# Patient Record
Sex: Male | Born: 1965 | Race: White | Hispanic: No | Marital: Single | Smoking: Current every day smoker
Health system: Southern US, Community
[De-identification: ages and names within clinical notes are randomized; demographics above are authoritative.]

## PROBLEM LIST (undated history)

## (undated) ENCOUNTER — Inpatient Hospital Stay: Payer: Self-pay

## (undated) DIAGNOSIS — I10 Essential (primary) hypertension: Secondary | ICD-10-CM

## (undated) DIAGNOSIS — F32A Depression, unspecified: Secondary | ICD-10-CM

## (undated) DIAGNOSIS — J449 Chronic obstructive pulmonary disease, unspecified: Secondary | ICD-10-CM

## (undated) DIAGNOSIS — I639 Cerebral infarction, unspecified: Secondary | ICD-10-CM

## (undated) DIAGNOSIS — F329 Major depressive disorder, single episode, unspecified: Secondary | ICD-10-CM

## (undated) DIAGNOSIS — M1990 Unspecified osteoarthritis, unspecified site: Secondary | ICD-10-CM

## (undated) DIAGNOSIS — E785 Hyperlipidemia, unspecified: Secondary | ICD-10-CM

## (undated) DIAGNOSIS — K5792 Diverticulitis of intestine, part unspecified, without perforation or abscess without bleeding: Secondary | ICD-10-CM

## (undated) HISTORY — DX: Major depressive disorder, single episode, unspecified: F32.9

## (undated) HISTORY — DX: Depression, unspecified: F32.A

## (undated) HISTORY — DX: Cerebral infarction, unspecified: I63.9

## (undated) HISTORY — DX: Hyperlipidemia, unspecified: E78.5

## (undated) HISTORY — DX: Chronic obstructive pulmonary disease, unspecified: J44.9

## (undated) HISTORY — DX: Unspecified osteoarthritis, unspecified site: M19.90

## (undated) HISTORY — DX: Diverticulitis of intestine, part unspecified, without perforation or abscess without bleeding: K57.92

## (undated) HISTORY — DX: Essential (primary) hypertension: I10

## (undated) HISTORY — PX: JOINT REPLACEMENT: SHX530

## (undated) SURGERY — Surgical Case
Anesthesia: *Unknown

---

## 2004-08-29 ENCOUNTER — Emergency Department: Payer: Self-pay | Admitting: Emergency Medicine

## 2007-06-28 ENCOUNTER — Emergency Department: Payer: Self-pay | Admitting: Emergency Medicine

## 2010-02-18 HISTORY — PX: OTHER SURGICAL HISTORY: SHX169

## 2011-04-04 ENCOUNTER — Emergency Department: Payer: Self-pay | Admitting: Emergency Medicine

## 2011-04-04 LAB — BASIC METABOLIC PANEL
Anion Gap: 6 — ABNORMAL LOW (ref 7–16)
Calcium, Total: 8.7 mg/dL (ref 8.5–10.1)
Chloride: 106 mmol/L (ref 98–107)
Co2: 28 mmol/L (ref 21–32)
Osmolality: 279 (ref 275–301)
Potassium: 4.1 mmol/L (ref 3.5–5.1)

## 2011-04-04 LAB — CBC
MCHC: 34.1 g/dL (ref 32.0–36.0)
MCV: 99 fL (ref 80–100)
RBC: 4.28 10*6/uL — ABNORMAL LOW (ref 4.40–5.90)
RDW: 13.4 % (ref 11.5–14.5)
WBC: 7.1 10*3/uL (ref 3.8–10.6)

## 2011-07-30 DIAGNOSIS — J441 Chronic obstructive pulmonary disease with (acute) exacerbation: Secondary | ICD-10-CM | POA: Insufficient documentation

## 2011-07-30 DIAGNOSIS — J449 Chronic obstructive pulmonary disease, unspecified: Secondary | ICD-10-CM | POA: Insufficient documentation

## 2011-08-15 DIAGNOSIS — E669 Obesity, unspecified: Secondary | ICD-10-CM | POA: Insufficient documentation

## 2012-08-13 DIAGNOSIS — M778 Other enthesopathies, not elsewhere classified: Secondary | ICD-10-CM | POA: Insufficient documentation

## 2013-09-28 ENCOUNTER — Emergency Department: Payer: Self-pay | Admitting: Internal Medicine

## 2014-02-18 DIAGNOSIS — I639 Cerebral infarction, unspecified: Secondary | ICD-10-CM

## 2014-02-18 HISTORY — DX: Cerebral infarction, unspecified: I63.9

## 2015-10-03 DIAGNOSIS — M25511 Pain in right shoulder: Secondary | ICD-10-CM

## 2015-10-03 DIAGNOSIS — M25561 Pain in right knee: Secondary | ICD-10-CM

## 2015-10-03 DIAGNOSIS — M25562 Pain in left knee: Secondary | ICD-10-CM

## 2015-10-03 DIAGNOSIS — Z96611 Presence of right artificial shoulder joint: Secondary | ICD-10-CM | POA: Insufficient documentation

## 2015-10-03 DIAGNOSIS — Z96612 Presence of left artificial shoulder joint: Secondary | ICD-10-CM | POA: Insufficient documentation

## 2015-10-03 DIAGNOSIS — M25512 Pain in left shoulder: Secondary | ICD-10-CM

## 2015-10-03 DIAGNOSIS — G8929 Other chronic pain: Secondary | ICD-10-CM | POA: Insufficient documentation

## 2016-10-10 ENCOUNTER — Ambulatory Visit: Payer: Medicaid Other | Admitting: Student in an Organized Health Care Education/Training Program

## 2016-10-17 ENCOUNTER — Ambulatory Visit
Payer: Medicaid Other | Attending: Student in an Organized Health Care Education/Training Program | Admitting: Student in an Organized Health Care Education/Training Program

## 2016-10-17 ENCOUNTER — Encounter: Payer: Self-pay | Admitting: Student in an Organized Health Care Education/Training Program

## 2016-10-17 VITALS — BP 162/96 | HR 102 | Temp 98.9°F | Resp 16 | Ht 73.5 in | Wt 270.0 lb

## 2016-10-17 DIAGNOSIS — Z96612 Presence of left artificial shoulder joint: Secondary | ICD-10-CM | POA: Diagnosis not present

## 2016-10-17 DIAGNOSIS — M25512 Pain in left shoulder: Secondary | ICD-10-CM | POA: Insufficient documentation

## 2016-10-17 DIAGNOSIS — Z96653 Presence of artificial knee joint, bilateral: Secondary | ICD-10-CM | POA: Diagnosis not present

## 2016-10-17 DIAGNOSIS — Z7982 Long term (current) use of aspirin: Secondary | ICD-10-CM | POA: Insufficient documentation

## 2016-10-17 DIAGNOSIS — Z96611 Presence of right artificial shoulder joint: Secondary | ICD-10-CM

## 2016-10-17 DIAGNOSIS — M19011 Primary osteoarthritis, right shoulder: Secondary | ICD-10-CM | POA: Diagnosis not present

## 2016-10-17 DIAGNOSIS — E785 Hyperlipidemia, unspecified: Secondary | ICD-10-CM | POA: Diagnosis not present

## 2016-10-17 DIAGNOSIS — Z87891 Personal history of nicotine dependence: Secondary | ICD-10-CM | POA: Insufficient documentation

## 2016-10-17 DIAGNOSIS — M19012 Primary osteoarthritis, left shoulder: Secondary | ICD-10-CM

## 2016-10-17 DIAGNOSIS — F329 Major depressive disorder, single episode, unspecified: Secondary | ICD-10-CM | POA: Diagnosis not present

## 2016-10-17 DIAGNOSIS — Z6835 Body mass index (BMI) 35.0-35.9, adult: Secondary | ICD-10-CM | POA: Diagnosis not present

## 2016-10-17 DIAGNOSIS — G894 Chronic pain syndrome: Secondary | ICD-10-CM | POA: Diagnosis not present

## 2016-10-17 DIAGNOSIS — M5136 Other intervertebral disc degeneration, lumbar region: Secondary | ICD-10-CM | POA: Diagnosis not present

## 2016-10-17 DIAGNOSIS — M25561 Pain in right knee: Secondary | ICD-10-CM | POA: Diagnosis not present

## 2016-10-17 DIAGNOSIS — Z79899 Other long term (current) drug therapy: Secondary | ICD-10-CM | POA: Diagnosis not present

## 2016-10-17 DIAGNOSIS — M51369 Other intervertebral disc degeneration, lumbar region without mention of lumbar back pain or lower extremity pain: Secondary | ICD-10-CM

## 2016-10-17 DIAGNOSIS — G8929 Other chronic pain: Secondary | ICD-10-CM

## 2016-10-17 DIAGNOSIS — J449 Chronic obstructive pulmonary disease, unspecified: Secondary | ICD-10-CM | POA: Insufficient documentation

## 2016-10-17 DIAGNOSIS — M25552 Pain in left hip: Secondary | ICD-10-CM | POA: Diagnosis not present

## 2016-10-17 DIAGNOSIS — M47816 Spondylosis without myelopathy or radiculopathy, lumbar region: Secondary | ICD-10-CM | POA: Diagnosis not present

## 2016-10-17 DIAGNOSIS — M25572 Pain in left ankle and joints of left foot: Secondary | ICD-10-CM | POA: Insufficient documentation

## 2016-10-17 DIAGNOSIS — M25571 Pain in right ankle and joints of right foot: Secondary | ICD-10-CM | POA: Insufficient documentation

## 2016-10-17 DIAGNOSIS — M25511 Pain in right shoulder: Secondary | ICD-10-CM | POA: Diagnosis present

## 2016-10-17 DIAGNOSIS — M25562 Pain in left knee: Secondary | ICD-10-CM | POA: Diagnosis not present

## 2016-10-17 DIAGNOSIS — K219 Gastro-esophageal reflux disease without esophagitis: Secondary | ICD-10-CM | POA: Diagnosis not present

## 2016-10-17 DIAGNOSIS — Z8673 Personal history of transient ischemic attack (TIA), and cerebral infarction without residual deficits: Secondary | ICD-10-CM | POA: Diagnosis not present

## 2016-10-17 DIAGNOSIS — I10 Essential (primary) hypertension: Secondary | ICD-10-CM | POA: Insufficient documentation

## 2016-10-17 DIAGNOSIS — M170 Bilateral primary osteoarthritis of knee: Secondary | ICD-10-CM

## 2016-10-17 MED ORDER — TIZANIDINE HCL 4 MG PO TABS: 4.0000 mg | ORAL_TABLET | Freq: Three times a day (TID) | ORAL | 2 refills | Status: AC | PRN

## 2016-10-17 MED ORDER — MELOXICAM 7.5 MG PO TABS: 7.5000 mg | ORAL_TABLET | Freq: Two times a day (BID) | ORAL | 0 refills | Status: AC | PRN

## 2016-10-17 MED ORDER — TIZANIDINE HCL 4 MG PO TABS: 4.0000 mg | ORAL_TABLET | Freq: Three times a day (TID) | ORAL | 0 refills | Status: DC | PRN

## 2016-10-17 MED ORDER — GABAPENTIN 300 MG PO CAPS: 600.0000 mg | ORAL_CAPSULE | Freq: Three times a day (TID) | ORAL | 2 refills | Status: DC

## 2016-10-17 NOTE — Patient Instructions (Addendum)
Today we discussed the following:  #1. We will get imaging studies, x-rays of your knees, hips, lumbar spine, shoulders, right ankle. We will review this at the next visit.  #2. Increase his gabapentin to 600 mg 3 times daily. Prescription has been provided.  #3. Start tizanidine 4 mg 3 times a day when necessary for muscle spasms.  #4. Prescription for meloxicam/Mobic 7.5 mg twice daily for 14 days.  #5. Urine drug screen today.  #6. Follow-up in one month.

## 2016-10-17 NOTE — Progress Notes (Signed)
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 opioid therapy pending pain psych evaluation and appropriate urine drug screens.   Patient has not had any recent imaging studies in the last 3 years so I think it is worthwhile to get x-rays of his shoulder, low back, hips, SI joints, knees, left ankle.  In regards to medication management, patient is on gabapentin 300 mg 3 times a  day which she has been on for the last year. We will increase his dose to 600 mg 3 times daily. I'll also prescribe the patient tizanidine 4 mg twice a day to 3 times a day when necessary muscle spasms. Also prescribe him meloxicam 7.5 mg twice a day for 14 days for his inflammatory knee pain. Patient has tried physical therapy in the past and states it was not helpful.  Plan: 1. Urine drug screen today. I expect this to be negative for all controlled or illicit substances.  2. X-rays as below.  3. Increase gabapentin to 600 mg 3 times a day. Prescription provided.  4. Tizanidine 4 mg twice a day to 3 times a day when necessary muscle spasms.  5. Meloxicam 7.5 mg twice a day for 14 days  6.  Follow with me in one month to review x-ray studies and plan of care. Most likely schedule for bilateral genicular nerves block with goal RFA.  Ordered Lab-work, Procedure(s), Referral(s), & Consult(s): Orders Placed This Encounter  Procedures  . DG Ankle Complete Right  . DG Lumbar Spine Complete W/Bend  . DG Si Joints  . DG Shoulder Right  . DG Shoulder Left  . DG HIP UNILAT W OR W/O PELVIS 2-3 VIEWS LEFT  . DG HIP UNILAT W OR W/O PELVIS 2-3 VIEWS RIGHT  . Compliance Drug Analysis, Ur   Pharmacotherapy (current): Medications ordered:  Meds ordered this encounter  Medications  . gabapentin (NEURONTIN) 300 MG capsule    Sig: Take 2 capsules (600 mg total) by mouth 3 (three) times daily.    Dispense:  180 capsule    Refill:  2  . DISCONTD: tiZANidine (ZANAFLEX) 4 MG tablet    Sig: Take 1 tablet (4 mg total) by mouth every 8 (eight) hours as needed for muscle spasms.    Dispense:  30 tablet    Refill:  0  . meloxicam (MOBIC) 7.5 MG tablet    Sig: Take 1 tablet (7.5 mg total) by mouth 2 (two) times daily between meals as needed for pain.    Dispense:  28 tablet    Refill:  0  . tiZANidine (ZANAFLEX) 4 MG tablet    Sig: Take 1 tablet (4 mg total) by mouth every 8 (eight) hours as needed for muscle spasms.    Dispense:  90 tablet    Refill:  2   Medications administered during this visit: Mr. Lutes had no medications administered during this visit.   Pharmacological management options:  Opioid Analgesics: The patient was informed that there is no guarantee that he would be a candidate for opioid analgesics. The decision will be made following CDC guidelines. This decision will be based on the results of diagnostic studies, as well as Mr. Rosiles risk profile. Avoid opioid therapy for now   Membrane stabilizer: Will increase gabapentin to 600 mg 3 times a day. If this is not effective, can consider Lyrica or Topamax.  Muscle relaxant: Has tried Flexeril and baclofen, were not  effective. We'll trial tizanidine today.  NSAID: Trial of meloxicam. If it helps, can continue versus diclofenac by mouth  Other analgesic(s): Tricyclic antidepressants, Cymbalta, Voltaren gel, TENS unit, tramadol, Nucynta   Interventional management options: Mr. Hemphill was informed that there is no guarantee that he would be a candidate for interventional therapies. The decision will be based on the results of diagnostic studies, as well as Mr. Kniss risk profile.  Procedure(s) under consideration:  -Bilateral genicular nerves block -Left  sacroiliac joint injection -Left intra-articular hip injection -Left greater trochanteric bursa injection -Lumbar medial branch nerve blocks with goal radiofrequency ablation, diagnostic -Shoulder joint injections    Provider-requested follow-up: No Follow-up on file.  Future Appointments Date Time Provider [place]  11/19/2016 8:45 AM Gillis Santa, MD [facility] None    Primary Care Physician: Parke Poisson, DO Location: [facility] Note by: Gillis Santa, M.D, Date: 10/17/2016; Time: 2:07 PM  Patient Instructions  Today we discussed the following:  #1. We will get imaging studies, x-rays of your knees, hips, lumbar spine, shoulders, right ankle. We will review this at the next visit.  #2. Increase his gabapentin to 600 mg 3 times daily. Prescription has been provided.  #3. Start tizanidine 4 mg 3 times a day when necessary for muscle spasms.  #4. Prescription for meloxicam/Mobic 7.5 mg twice daily for 14 days.  #5. Urine drug screen today.  #6. Follow-up in one month.

## 2016-10-23 LAB — COMPLIANCE DRUG ANALYSIS, UR

## 2016-11-19 ENCOUNTER — Ambulatory Visit: Payer: Self-pay | Admitting: Student in an Organized Health Care Education/Training Program

## 2016-12-04 ENCOUNTER — Ambulatory Visit
Admission: RE | Admit: 2016-12-04 | Discharge: 2016-12-04 | Disposition: A | Discharge status: Home / Self Care | Payer: Medicaid Other | Source: Ambulatory Visit | Attending: Student in an Organized Health Care Education/Training Program | Admitting: Student in an Organized Health Care Education/Training Program

## 2016-12-04 DIAGNOSIS — M25552 Pain in left hip: Secondary | ICD-10-CM | POA: Diagnosis present

## 2016-12-04 DIAGNOSIS — M799 Soft tissue disorder, unspecified: Secondary | ICD-10-CM | POA: Diagnosis not present

## 2016-12-04 DIAGNOSIS — M1612 Unilateral primary osteoarthritis, left hip: Secondary | ICD-10-CM | POA: Diagnosis not present

## 2016-12-04 DIAGNOSIS — Z96611 Presence of right artificial shoulder joint: Secondary | ICD-10-CM | POA: Insufficient documentation

## 2016-12-04 DIAGNOSIS — M25561 Pain in right knee: Principal | ICD-10-CM

## 2016-12-04 DIAGNOSIS — Z9889 Other specified postprocedural states: Secondary | ICD-10-CM | POA: Diagnosis not present

## 2016-12-04 DIAGNOSIS — M47816 Spondylosis without myelopathy or radiculopathy, lumbar region: Secondary | ICD-10-CM | POA: Diagnosis present

## 2016-12-04 DIAGNOSIS — M25512 Pain in left shoulder: Secondary | ICD-10-CM | POA: Diagnosis not present

## 2016-12-04 DIAGNOSIS — M4316 Spondylolisthesis, lumbar region: Secondary | ICD-10-CM | POA: Insufficient documentation

## 2016-12-04 DIAGNOSIS — M25562 Pain in left knee: Principal | ICD-10-CM

## 2016-12-04 DIAGNOSIS — M25511 Pain in right shoulder: Secondary | ICD-10-CM | POA: Diagnosis not present

## 2016-12-04 DIAGNOSIS — M25519 Pain in unspecified shoulder: Secondary | ICD-10-CM | POA: Diagnosis present

## 2016-12-04 DIAGNOSIS — G8929 Other chronic pain: Secondary | ICD-10-CM

## 2016-12-05 ENCOUNTER — Ambulatory Visit: Payer: Self-pay | Admitting: Student in an Organized Health Care Education/Training Program

## 2016-12-19 ENCOUNTER — Ambulatory Visit
Payer: Self-pay | Attending: Student in an Organized Health Care Education/Training Program | Admitting: Student in an Organized Health Care Education/Training Program

## 2017-09-10 ENCOUNTER — Ambulatory Visit
Admission: RE | Admit: 2017-09-10 | Discharge: 2017-09-10 | Disposition: A | Discharge status: Home / Self Care | Payer: Medicaid Other | Source: Ambulatory Visit | Attending: Student in an Organized Health Care Education/Training Program | Admitting: Student in an Organized Health Care Education/Training Program

## 2017-09-10 ENCOUNTER — Encounter: Payer: Self-pay | Admitting: Student in an Organized Health Care Education/Training Program

## 2017-09-10 ENCOUNTER — Other Ambulatory Visit: Payer: Self-pay

## 2017-09-10 ENCOUNTER — Ambulatory Visit
Payer: Medicaid Other | Attending: Student in an Organized Health Care Education/Training Program | Admitting: Student in an Organized Health Care Education/Training Program

## 2017-09-10 VITALS — BP 158/89 | HR 96 | Temp 98.2°F | Resp 18 | Ht 73.0 in | Wt 275.0 lb

## 2017-09-10 DIAGNOSIS — M25561 Pain in right knee: Secondary | ICD-10-CM | POA: Diagnosis not present

## 2017-09-10 DIAGNOSIS — G8929 Other chronic pain: Secondary | ICD-10-CM

## 2017-09-10 DIAGNOSIS — E669 Obesity, unspecified: Secondary | ICD-10-CM | POA: Diagnosis not present

## 2017-09-10 DIAGNOSIS — M47816 Spondylosis without myelopathy or radiculopathy, lumbar region: Secondary | ICD-10-CM | POA: Insufficient documentation

## 2017-09-10 DIAGNOSIS — J449 Chronic obstructive pulmonary disease, unspecified: Secondary | ICD-10-CM | POA: Insufficient documentation

## 2017-09-10 DIAGNOSIS — M11261 Other chondrocalcinosis, right knee: Secondary | ICD-10-CM | POA: Insufficient documentation

## 2017-09-10 DIAGNOSIS — Z7982 Long term (current) use of aspirin: Secondary | ICD-10-CM | POA: Insufficient documentation

## 2017-09-10 DIAGNOSIS — M19011 Primary osteoarthritis, right shoulder: Secondary | ICD-10-CM | POA: Diagnosis not present

## 2017-09-10 DIAGNOSIS — Z96611 Presence of right artificial shoulder joint: Secondary | ICD-10-CM

## 2017-09-10 DIAGNOSIS — I10 Essential (primary) hypertension: Secondary | ICD-10-CM | POA: Diagnosis not present

## 2017-09-10 DIAGNOSIS — G894 Chronic pain syndrome: Secondary | ICD-10-CM | POA: Diagnosis not present

## 2017-09-10 DIAGNOSIS — M11262 Other chondrocalcinosis, left knee: Secondary | ICD-10-CM | POA: Diagnosis not present

## 2017-09-10 DIAGNOSIS — Z79899 Other long term (current) drug therapy: Secondary | ICD-10-CM | POA: Insufficient documentation

## 2017-09-10 DIAGNOSIS — Z96612 Presence of left artificial shoulder joint: Secondary | ICD-10-CM

## 2017-09-10 DIAGNOSIS — Z87891 Personal history of nicotine dependence: Secondary | ICD-10-CM | POA: Diagnosis not present

## 2017-09-10 DIAGNOSIS — M170 Bilateral primary osteoarthritis of knee: Secondary | ICD-10-CM | POA: Diagnosis present

## 2017-09-10 DIAGNOSIS — M25571 Pain in right ankle and joints of right foot: Secondary | ICD-10-CM | POA: Insufficient documentation

## 2017-09-10 DIAGNOSIS — Z6836 Body mass index (BMI) 36.0-36.9, adult: Secondary | ICD-10-CM | POA: Diagnosis not present

## 2017-09-10 DIAGNOSIS — M1612 Unilateral primary osteoarthritis, left hip: Secondary | ICD-10-CM | POA: Insufficient documentation

## 2017-09-10 DIAGNOSIS — M5136 Other intervertebral disc degeneration, lumbar region: Secondary | ICD-10-CM

## 2017-09-10 DIAGNOSIS — Z8673 Personal history of transient ischemic attack (TIA), and cerebral infarction without residual deficits: Secondary | ICD-10-CM | POA: Insufficient documentation

## 2017-09-10 DIAGNOSIS — M19012 Primary osteoarthritis, left shoulder: Secondary | ICD-10-CM | POA: Insufficient documentation

## 2017-09-10 DIAGNOSIS — E785 Hyperlipidemia, unspecified: Secondary | ICD-10-CM | POA: Insufficient documentation

## 2017-09-10 DIAGNOSIS — K219 Gastro-esophageal reflux disease without esophagitis: Secondary | ICD-10-CM | POA: Insufficient documentation

## 2017-09-10 DIAGNOSIS — F329 Major depressive disorder, single episode, unspecified: Secondary | ICD-10-CM | POA: Insufficient documentation

## 2017-09-10 DIAGNOSIS — M25562 Pain in left knee: Secondary | ICD-10-CM

## 2017-09-10 MED ORDER — DICLOFENAC SODIUM 75 MG PO TBEC: 75.0000 mg | DELAYED_RELEASE_TABLET | Freq: Two times a day (BID) | ORAL | 1 refills | Status: DC

## 2017-09-10 NOTE — Patient Instructions (Signed)
Voltaren has been escribed to your pharmacy.  You will have Xrays completed before next appointment with pain clinic.

## 2017-09-10 NOTE — Progress Notes (Signed)
Safety precautions to be maintained throughout the outpatient stay will include: orient to surroundings, keep bed in low position, maintain call bell within reach at all times, provide assistance with transfer out of bed and ambulation.  

## 2017-09-10 NOTE — Progress Notes (Signed)
Patient's Name: Tommy Gaines  MRN: [id number]  Referring Provider: Parke Poisson, DO  DOB: 03-Sep-1965  PCP: Parke Poisson, DO  DOS: 09/10/2017  Note by: Gillis Santa, MD  Service setting: Ambulatory outpatient  Specialty: Interventional Pain Management  Location: [facility] (AMB) Pain Management Facility    Patient type: Established   Primary Reason(s) for Visit: Evaluation of chronic illnesses with exacerbation, or progression (Level of risk: moderate) CC: Knee Pain (bilateral); Hip Pain (left); and Ankle Pain (right)  HPI  [name] is a 52 y.o. year old, male patient, who comes today for a follow-up evaluation. He has Chronic airway obstruction ([place]); Chronic pain of both knees; Chronic pain of both shoulders; Tendonitis of elbow, left; Obesity; History of replacement of both shoulder joints; GERD (gastroesophageal reflux disease); Chronic pain syndrome; Primary osteoarthritis of both knees; and Primary osteoarthritis of both shoulders on their problem list. Mr. Kossman was last seen on Visit date not found. His primarily concern today is the Knee Pain (bilateral); Hip Pain (left); and Ankle Pain (right)  Pain Assessment: Location: Right, Left Knee Radiating:  yes posterior knee Onset: More than a month ago Duration: Chronic pain Quality: Aching Severity: 7 /10 (subjective, self-reported pain score)  Note: Reported level is inconsistent with clinical observations. Clinically the patient looks like a 2/10 A 2/10 is viewed as "Mild to Moderate" and described as noticeable and distracting. Impossible to hide from other people. More frequent flare-ups. Still possible to adapt and function close to normal. It can be very annoying and may have occasional stronger flare-ups. With discipline, patients may get used to it and adapt. Information on the proper use of the pain scale provided to the patient today. When using our objective Pain Scale, levels between 6 and 10/10 are said to belong in an emergency  room, as it progressively worsens from a 6/10, described as severely limiting, requiring emergency care not usually available at an outpatient pain management facility. At a 6/10 level, communication becomes difficult and requires great effort. Assistance to reach the emergency department may be required. Facial flushing and profuse sweating along with potentially dangerous increases in heart rate and blood pressure will be evident. Effect on ADL: Affects activities Timing: Intermittent Modifying factors: denies BP: (!) 158/89  HR: 96  Further details on both, my assessment(s), as well as the proposed treatment plan, please see below.  Laboratory Chemistry  Inflammation Markers (CRP: Acute Phase) (ESR: Chronic Phase) No results found for: CRP, ESRSEDRATE, LATICACIDVEN                       Rheumatology Markers No results found for: RF, ANA, LABURIC, URICUR, LYMEIGGIGMAB, LYMEABIGMQN, HLAB27                      Renal Function Markers Lab Results  Component Value Date   BUN 10 04/04/2011   CREATININE 1.13 04/04/2011   GFRAA >60 04/04/2011   GFRNONAA >60 04/04/2011                             Hepatic Function Markers No results found for: AST, ALT, ALBUMIN, ALKPHOS, HCVAB, AMYLASE, LIPASE, AMMONIA                      Electrolytes Lab Results  Component Value Date   NA 140 04/04/2011   K 4.1 04/04/2011   CL 106 04/04/2011   CALCIUM 8.7  04/04/2011                        Neuropathy Markers No results found for: VITAMINB12, FOLATE, HGBA1C, HIV                      Bone Pathology Markers No results found for: VD25OH, [id number], [id number], [id number], 25OHVITD1, 25OHVITD2, 25OHVITD3, TESTOFREE, TESTOSTERONE                       Coagulation Parameters Lab Results  Component Value Date   PLT 236 04/04/2011                        Cardiovascular Markers Lab Results  Component Value Date   TROPONINI < 0.02 04/04/2011   HGB 14.4 04/04/2011   HCT 42.3 04/04/2011                          CA Markers No results found for: CEA, CA125, LABCA2                      Note: Lab results reviewed.  Recent Diagnostic Imaging Review  Shoulder-R DG:  Results for orders placed during the hospital encounter of 12/04/16  DG Shoulder Right   Narrative CLINICAL DATA:  Chronic right shoulder pain.  EXAM: RIGHT SHOULDER - 2+ VIEW  COMPARISON:  None.  FINDINGS: Status post right shoulder arthroplasty. No fracture or dislocation is noted. Visualized ribs are unremarkable.  IMPRESSION: Status post right shoulder arthroplasty.   Electronically Signed   By: Marijo Conception, M.D.   On: 12/04/2016 14:07    Shoulder-L DG:  Results for orders placed during the hospital encounter of 12/04/16  DG Shoulder Left   Narrative CLINICAL DATA:  Chronic left shoulder pain.  EXAM: LEFT SHOULDER - 2+ VIEW  COMPARISON:  None.  FINDINGS: Status post left shoulder arthroplasty. No acute fracture or dislocation is noted. Visualized ribs appear normal.  IMPRESSION: No acute abnormality seen in the left shoulder.   Electronically Signed   By: Marijo Conception, M.D.   On: 12/04/2016 14:08    Lumbar DG Bending views:  Results for orders placed during the hospital encounter of 12/04/16  DG Lumbar Spine Complete W/Bend   Narrative CLINICAL DATA:  Chronic low back pain.  EXAM: LUMBAR SPINE - COMPLETE WITH BENDING VIEWS  COMPARISON:  None.  FINDINGS: Lumbar vertebral bodies intact. Grade 1 L4-5 anterolisthesis with chronic bilateral L4 pars interarticularis defects. 10 mm L4-5 anterolisthesis in neutral, 9 mm in flexion and 9 mm in extension. Moderate to severe L4-5 disc height loss with endplate sclerosis and marginal spurring. Moderate lower lumbar facet arthropathy. No destructive bony lesions. LEFT acetabular screws. Sacroiliac joints are symmetric. No destructive bony lesions. Soft tissue planes are nonsuspicious.  IMPRESSION: Grade 1 L4-5 anterolisthesis on the  basis of chronic L4 pars interarticularis defects without appreciable dynamic instability.   Electronically Signed   By: Elon Alas M.D.   On: 12/04/2016 14:14   Sacroiliac Joint Imaging: Sacroiliac Joint DG:  Results for orders placed during the hospital encounter of 12/04/16  DG Si Joints   Narrative CLINICAL DATA:  Chronic pain.  Pain management evaluation.  EXAM: BILATERAL SACROILIAC JOINTS - 3+ VIEW  COMPARISON:  None.  FINDINGS: The sacroiliac joint spaces are maintained and there is no evidence of arthropathy. No other  bone abnormalities are seen.  Left hip osteoarthritis with spurring and superolateral joint narrowing. Screws overlap the left acetabulum.  IMPRESSION: 1. 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 2. Postoperative left acetabulum with asymmetric left hip osteoarthritis.   Electronically Signed   By: Monte Fantasia M.D.   On: 12/04/2016 14:06      Hip-L DG 2-3 views:  Results for orders placed during the hospital encounter of 12/04/16  DG HIP UNILAT W OR W/O PELVIS 2-3 VIEWS LEFT   Addendum ADDENDUM REPORT: 12/04/2016 14:45  ADDENDUM: Normal right hip.   Electronically Signed   By: Marijo Conception, M.D.   On: 12/04/2016 14:45       Sabino Dick, MD 12/04/2016  2:47 PM         Narrative CLINICAL DATA:  Chronic left hip pain.  EXAM: DG HIP (WITH OR WITHOUT PELVIS) 2-3V LEFT  COMPARISON:  None.  FINDINGS: There is no evidence of hip fracture or dislocation. Surgical screws are seen in the medial left acetabulum. Mild joint space narrowing is noted with osteophyte formation.  IMPRESSION: Postsurgical and degenerative changes are noted involving the left hip. No acute abnormality is noted.  Electronically Signed: By: Marijo Conception, M.D. On: 12/04/2016 14:10     Ankle Imaging: Ankle-R DG Complete:  Results for orders placed during the hospital encounter of 12/04/16  DG Ankle Complete Right   Narrative CLINICAL DATA:  Chronic right ankle  pain. Pain management evaluation.  EXAM: RIGHT ANKLE - COMPLETE 3+ VIEW  COMPARISON:  None in PACs  FINDINGS: The bones are subjectively adequately mineralized. There is no acute or old malleolar fracture. The talar dome is intact. There are soft tissue calcifications noted adjacent to the lateral aspect of the talus. There are plantar and Achilles region calcaneal spurs. The subtalar joints are grossly normal. There is mild soft tissue swelling diffusely.  IMPRESSION: No acute bony abnormality of the right ankle. Soft tissue calcification along the lateral aspect of the talus inferior to the lateral malleolus may be degenerative or post traumatic.   Electronically Signed   By: David  Martinique M.D.   On: 12/04/2016 14:06    Complexity Note: Imaging results reviewed. Results shared with Mr. Kretschmer, using Layman's terms. Today I personally and independently reviewed the study images pertinent to Mr. Soria's problem.            I have personally examined the images and I agree with the reported  findings. I find no additional pain-related pathology to add to the report.  Meds   Current Outpatient Medications:  .  albuterol (PROVENTIL HFA;VENTOLIN HFA) 108 (90 Base) MCG/ACT inhaler, Inhale 2 puffs into the lungs 3 (three) times daily., Disp: , Rfl:  .  amLODipine (NORVASC) 5 MG tablet, Take 5 mg by mouth daily., Disp: , Rfl:  .  aspirin EC 81 MG tablet, Take 81 mg by mouth., Disp: , Rfl:  .  atorvastatin (LIPITOR) 10 MG tablet, Take 10 mg by mouth daily., Disp: , Rfl:  .  budesonide-formoterol (SYMBICORT) 160-4.5 MCG/ACT inhaler, Inhale 1 puff into the lungs 2 (two) times daily., Disp: , Rfl:  .  furosemide (LASIX) 20 MG tablet, Take 20 mg by mouth daily., Disp: , Rfl:  .  gabapentin (NEURONTIN) 300 MG capsule, Take 2 capsules (600 mg total) by mouth 3 (three) times daily., Disp: 180 capsule, Rfl: 2 .  omeprazole (PRILOSEC) 20 MG capsule, Take 20 mg by mouth daily., Disp: , Rfl:   .  tiotropium (SPIRIVA)  18 MCG inhalation capsule, Place 18 mcg into inhaler and inhale daily., Disp: , Rfl:  .  tiZANidine (ZANAFLEX) 4 MG tablet, Take 4 mg by mouth every 8 (eight) hours as needed for muscle spasms., Disp: , Rfl:  .  baclofen (LIORESAL) 10 MG tablet, Take 10 mg by mouth as needed., Disp: , Rfl:  .  diclofenac (VOLTAREN) 75 MG EC tablet, Take 1 tablet (75 mg total) by mouth 2 (two) times daily., Disp: 60 tablet, Rfl: 1 .  diclofenac sodium (VOLTAREN) 1 % GEL, Apply 1 application topically as needed., Disp: , Rfl:  .  FLUoxetine (PROZAC) 20 MG capsule, Take 20 mg by mouth daily., Disp: , Rfl:   ROS  Constitutional: Denies any fever or chills Gastrointestinal: No reported hemesis, hematochezia, vomiting, or acute GI distress Musculoskeletal: Denies any acute onset joint swelling, redness, loss of ROM, or weakness Neurological: No reported episodes of acute onset apraxia, aphasia, dysarthria, agnosia, amnesia, paralysis, loss of coordination, or loss of consciousness  Allergies  Mr. Dube is allergic to acetaminophen and lidocaine.  [place]  Drug: Mr. Perra  reports that he does not use drugs. Alcohol:  reports that he does not drink alcohol. Tobacco:  reports that he has quit smoking. He has quit using smokeless tobacco. Medical:  has a past medical history of Arthritis, COPD (chronic obstructive pulmonary disease) ([place]), Depression, Diverticulitis, Hyperlipidemia, Hypertension, and Stroke ([place]) (2016). Surgical: Mr. Sonneborn  has a past surgical history that includes intestinal rupture (2012) and Joint replacement (Bilateral). Family: family history includes Cancer in his mother; Diabetes in his mother; Heart disease in his father.  Constitutional Exam  General appearance: Well nourished, well developed, and well hydrated. In no apparent acute distress Vitals:   09/10/17 1307 09/10/17 1309  BP:  (!) 158/89  Pulse: 96   Resp: 18   Temp: 98.2 F (36.8 C)   SpO2: 99%    Weight: 275 lb (124.7 kg)   Height: '6\' 1"'  (1.854 m)    BMI Assessment: Estimated body mass index is 36.28 kg/m as calculated from the following:   Height as of this encounter: '6\' 1"'  (1.854 m).   Weight as of this encounter: 275 lb (124.7 kg).  BMI interpretation table: BMI level Category Range association with higher incidence of chronic pain  <18 kg/m2 Underweight   18.5-24.9 kg/m2 Ideal body weight   25-29.9 kg/m2 Overweight Increased incidence by 20%  30-34.9 kg/m2 Obese (Class I) Increased incidence by 68%  35-39.9 kg/m2 Severe obesity (Class II) Increased incidence by 136%  >40 kg/m2 Extreme obesity (Class III) Increased incidence by 254%   Patient's current BMI Ideal Body weight  Body mass index is 36.28 kg/m. Ideal body weight: 79.9 kg (176 lb 2.4 oz) Adjusted ideal body weight: 97.8 kg (215 lb 11 oz)   BMI Readings from Last 4 Encounters:  09/10/17 36.28 kg/m  10/17/16 35.14 kg/m   Wt Readings from Last 4 Encounters:  09/10/17 275 lb (124.7 kg)  10/17/16 270 lb (122.5 kg)  Psych/Mental status: Alert, oriented x 3 (person, place, & time)       Eyes: PERLA Respiratory: No evidence of acute respiratory distress  Cervical Spine Area Exam  Skin & Axial Inspection: No masses, redness, edema, swelling, or associated skin lesions Alignment: Symmetrical Functional ROM: Unrestricted ROM      Stability: No instability detected Muscle Tone/Strength: Functionally intact. No obvious neuro-muscular anomalies detected. Sensory (Neurological): Unimpaired Palpation: No palpable anomalies  Upper Extremity (UE) Exam    Side: Right upper extremity  Side: Left upper extremity  Skin & Extremity Inspection: Skin color, temperature, and hair growth are WNL. No peripheral edema or cyanosis. No masses, redness, swelling, asymmetry, or associated skin lesions. No contractures.  Skin & Extremity Inspection: Skin color, temperature, and hair growth are WNL. No peripheral edema  or cyanosis. No masses, redness, swelling, asymmetry, or associated skin lesions. No contractures.  Functional ROM: Unrestricted ROM          Functional ROM: Unrestricted ROM          Muscle Tone/Strength: Functionally intact. No obvious neuro-muscular anomalies detected.  Muscle Tone/Strength: Functionally intact. No obvious neuro-muscular anomalies detected.  Sensory (Neurological): Unimpaired          Sensory (Neurological): Unimpaired          Palpation: No palpable anomalies              Palpation: No palpable anomalies              Provocative Test(s):  Phalen's test: deferred Tinel's test: deferred Apley's scratch test (touch opposite shoulder):  Action 1 (Across chest): Decreased ROM Action 2 (Overhead): Decreased ROM Action 3 (LB reach): Decreased ROM   Provocative Test(s):  Phalen's test: deferred Tinel's test: deferred Apley's scratch test (touch opposite shoulder):  Action 1 (Across chest): Decreased ROM Action 2 (Overhead): Decreased ROM Action 3 (LB reach): Decreased ROM   Limited shoulder abduction bilaterally, limited secondary to pain 5 out of 5 strength bilateral upper extremity: C5 shoulder, C6 elbow flexion, C7 elbow extension, C8 thumb extension  Thoracic Spine Area Exam  Skin & Axial Inspection: No masses, redness, or swelling Alignment: Symmetrical Functional ROM: Unrestricted ROM Stability: No instability detected Muscle Tone/Strength: Functionally intact. No obvious neuro-muscular anomalies detected. Sensory (Neurological): Unimpaired Muscle strength & Tone: No palpable anomalies  Lumbar Spine Area Exam  Skin & Axial Inspection: No masses, redness, or swelling Alignment: Symmetrical Functional ROM: Decreased ROM affecting primarily the left Stability: No instability detected Muscle Tone/Strength: Functionally intact. No obvious neuro-muscular anomalies detected. Sensory (Neurological): Musculoskeletal pain pattern Palpation: No palpable anomalies        Provocative Tests: Lumbar Hyperextension/rotation test: (+) bilaterally for facet joint pain. Lumbar quadrant test (Kemp's test): deferred today       Lumbar Lateral bending test: deferred today       Patrick's Maneuver: deferred today                   FABER test: deferred today                   Thigh-thrust test: deferred today       S-I compression test: deferred today       S-I distraction test: deferred today        Gait & Posture Assessment  Ambulation: Unassisted Gait: Relatively normal for age and body habitus Posture: WNL   Lower Extremity Exam    Side: Right lower extremity  Side: Left lower extremity  Stability: No instability observed          Stability: No instability observed          Skin & Extremity Inspection: Skin color, temperature, and hair growth are WNL. No peripheral edema or cyanosis. No masses, redness, swelling, asymmetry, or associated skin lesions. No contractures.  Skin & Extremity Inspection: Skin color, temperature, and hair growth are WNL. No peripheral edema or cyanosis. No masses, redness, swelling, asymmetry,  or associated skin lesions. No contractures.  Functional ROM: Decreased ROM for knee joint          Functional ROM: Decreased ROM for hip and knee joints          Muscle Tone/Strength: Functionally intact. No obvious neuro-muscular anomalies detected.  Muscle Tone/Strength: Functionally intact. No obvious neuro-muscular anomalies detected.  Sensory (Neurological): Arthropathic arthralgia  Sensory (Neurological): Arthropathic arthralgia  Palpation: Complains of area being tender to palpation  Palpation: Complains of area being tender to palpation   Assessment  Primary Diagnosis & Pertinent Problem List: The primary encounter diagnosis was Chronic pain syndrome. Diagnoses of Chronic pain of both knees, Primary osteoarthritis of both knees, Lumbar degenerative disc disease, Lumbar spondylosis, Primary osteoarthritis of both shoulders, and History of  replacement of both shoulder joints were also pertinent to this visit.  Status Diagnosis  Persistent Persistent Persistent 1. Chronic pain syndrome   2. Chronic pain of both knees   3. Primary osteoarthritis of both knees   4. Lumbar degenerative disc disease   5. Lumbar spondylosis   6. Primary osteoarthritis of both shoulders   7. History of replacement of both shoulder joints     Problems updated and reviewed during this visit: Problem  Chronic Pain Syndrome  Primary Osteoarthritis of Both Knees  Primary Osteoarthritis of Both Shoulders   Plan of Care  Pharmacotherapy (Medications Ordered): Meds ordered this encounter  Medications  . diclofenac (VOLTAREN) 75 MG EC tablet    Sig: Take 1 tablet (75 mg total) by mouth 2 (two) times daily.    Dispense:  60 tablet    Refill:  1   Lab-work, procedure(s), and/or referral(s): Orders Placed This Encounter  Procedures  . DG Knee 1-2 Views Left  . DG Knee 1-2 Views Right    Pharmacological management options:  Opioid Analgesics: At this time, I do not recommend the use of opioids on this patient Membrane stabilizer: We have discussed the possibility of optimizing this mode of therapy, if tolerated Muscle relaxant: We have discussed the possibility of a trial NSAID: We have discussed the possibility of a trial Other analgesic(s): To be determined at a later time   Considering:   Bilateral genicular nerve block with possible RFA Intra-articular steroid injection Viscosupplementation Lumbar facet medial branch nerve block   PRN Procedures:   To be determined at a later time   Provider-requested follow-up: Return in about 3 weeks (around 10/01/2017) for Medication Management, After Imaging.  Time Note: Greater than 50% of the 25 minute(s) of face-to-face time spent with Mr. Bayona, was spent in counseling/coordination of care regarding: Mr. Maack primary cause of pain, the results of his recent test(s), the significance  of each one oth the test(s) anomalies and it's corresponding characteristic pain pattern(s), the treatment plan, treatment alternatives, the risks and possible complications of proposed treatment, medication side effects, the appropriate use of his medications, the goals of pain management (increased in functionality) and the need to bring and keep the BMI below 30.  Future Appointments  Date Time Provider [place]  10/06/2017  2:00 PM Gillis Santa, MD [facility] None    Primary Care Physician: Parke Poisson, DO Location: [facility] Note by: Gillis Santa, M.D Date: 09/10/2017; Time: 3:44 PM  Patient Instructions  Voltaren has been escribed to your pharmacy.  You will have Xrays completed before next appointment with pain clinic.

## 2017-10-06 ENCOUNTER — Ambulatory Visit
Payer: Medicaid Other | Attending: Student in an Organized Health Care Education/Training Program | Admitting: Student in an Organized Health Care Education/Training Program

## 2017-10-06 ENCOUNTER — Other Ambulatory Visit: Payer: Self-pay

## 2017-10-06 ENCOUNTER — Encounter: Payer: Self-pay | Admitting: Student in an Organized Health Care Education/Training Program

## 2017-10-06 VITALS — BP 167/100 | HR 74 | Temp 98.6°F | Resp 18 | Ht 73.5 in | Wt 275.0 lb

## 2017-10-06 DIAGNOSIS — Z79899 Other long term (current) drug therapy: Secondary | ICD-10-CM | POA: Insufficient documentation

## 2017-10-06 DIAGNOSIS — M170 Bilateral primary osteoarthritis of knee: Secondary | ICD-10-CM | POA: Insufficient documentation

## 2017-10-06 DIAGNOSIS — I10 Essential (primary) hypertension: Secondary | ICD-10-CM | POA: Insufficient documentation

## 2017-10-06 DIAGNOSIS — M47816 Spondylosis without myelopathy or radiculopathy, lumbar region: Secondary | ICD-10-CM | POA: Insufficient documentation

## 2017-10-06 DIAGNOSIS — Z87891 Personal history of nicotine dependence: Secondary | ICD-10-CM | POA: Insufficient documentation

## 2017-10-06 DIAGNOSIS — Z791 Long term (current) use of non-steroidal anti-inflammatories (NSAID): Secondary | ICD-10-CM | POA: Diagnosis not present

## 2017-10-06 DIAGNOSIS — M779 Enthesopathy, unspecified: Secondary | ICD-10-CM | POA: Insufficient documentation

## 2017-10-06 DIAGNOSIS — Z7982 Long term (current) use of aspirin: Secondary | ICD-10-CM | POA: Insufficient documentation

## 2017-10-06 DIAGNOSIS — M1612 Unilateral primary osteoarthritis, left hip: Secondary | ICD-10-CM | POA: Insufficient documentation

## 2017-10-06 DIAGNOSIS — F329 Major depressive disorder, single episode, unspecified: Secondary | ICD-10-CM | POA: Insufficient documentation

## 2017-10-06 DIAGNOSIS — I252 Old myocardial infarction: Secondary | ICD-10-CM | POA: Insufficient documentation

## 2017-10-06 DIAGNOSIS — M19012 Primary osteoarthritis, left shoulder: Secondary | ICD-10-CM | POA: Insufficient documentation

## 2017-10-06 DIAGNOSIS — K219 Gastro-esophageal reflux disease without esophagitis: Secondary | ICD-10-CM | POA: Diagnosis not present

## 2017-10-06 DIAGNOSIS — Z8673 Personal history of transient ischemic attack (TIA), and cerebral infarction without residual deficits: Secondary | ICD-10-CM | POA: Insufficient documentation

## 2017-10-06 DIAGNOSIS — Z6835 Body mass index (BMI) 35.0-35.9, adult: Secondary | ICD-10-CM | POA: Diagnosis not present

## 2017-10-06 DIAGNOSIS — G894 Chronic pain syndrome: Secondary | ICD-10-CM | POA: Diagnosis not present

## 2017-10-06 DIAGNOSIS — E785 Hyperlipidemia, unspecified: Secondary | ICD-10-CM | POA: Diagnosis not present

## 2017-10-06 DIAGNOSIS — G8929 Other chronic pain: Secondary | ICD-10-CM

## 2017-10-06 DIAGNOSIS — M25561 Pain in right knee: Secondary | ICD-10-CM

## 2017-10-06 DIAGNOSIS — M19011 Primary osteoarthritis, right shoulder: Secondary | ICD-10-CM | POA: Diagnosis not present

## 2017-10-06 DIAGNOSIS — M25562 Pain in left knee: Secondary | ICD-10-CM

## 2017-10-06 DIAGNOSIS — J449 Chronic obstructive pulmonary disease, unspecified: Secondary | ICD-10-CM | POA: Diagnosis not present

## 2017-10-06 DIAGNOSIS — E669 Obesity, unspecified: Secondary | ICD-10-CM | POA: Insufficient documentation

## 2017-10-06 MED ORDER — CELECOXIB 200 MG PO CAPS: 200.0000 mg | ORAL_CAPSULE | Freq: Every day | ORAL | 1 refills | Status: DC

## 2017-10-06 NOTE — Patient Instructions (Signed)
Celecoxob 200mg  ready to be pick up at pharmacy.  Schedule Genicular Nerve block.    ____________________________________________________________________________________________  Preparing for Procedure with Sedation  Instructions: . Oral Intake: Do not eat or drink anything for at least 8 hours prior to your procedure. . Transportation: Public transportation is not allowed. Bring an adult driver. The driver must be physically present in our waiting room before any procedure can be started. Marland Kitchen Physical Assistance: Bring an adult physically capable of assisting you, in the event you need help. This adult should keep you company at home for at least 6 hours after the procedure. . Blood Pressure Medicine: Take your blood pressure medicine with a sip of water the morning of the procedure. . Blood thinners: Notify our staff if you are taking any blood thinners. Depending on which one you take, there will be specific instructions on how and when to stop it. . Diabetics on insulin: Notify the staff so that you can be scheduled 1st case in the morning. If your diabetes requires high dose insulin, take only  of your normal insulin dose the morning of the procedure and notify the staff that you have done so. . Preventing infections: Shower with an antibacterial soap the morning of your procedure. . Build-up your immune system: Take 1000 mg of Vitamin C with every meal (3 times a day) the day prior to your procedure. Marland Kitchen Antibiotics: Inform the staff if you have a condition or reason that requires you to take antibiotics before dental procedures. . Pregnancy: If you are pregnant, call and cancel the procedure. . Sickness: If you have a cold, fever, or any active infections, call and cancel the procedure. . Arrival: You must be in the facility at least 30 minutes prior to your scheduled procedure. . Children: Do not bring children with you. . Dress appropriately: Bring dark clothing that you would not mind  if they get stained. . Valuables: Do not bring any jewelry or valuables.  Procedure appointments are reserved for interventional treatments only. Marland Kitchen No Prescription Refills. . No medication changes will be discussed during procedure appointments. . No disability issues will be discussed.  Reasons to call and reschedule or cancel your procedure: (Following these recommendations will minimize the risk of a serious complication.) . Surgeries: Avoid having procedures within 2 weeks of any surgery. (Avoid for 2 weeks before or after any surgery). . Flu Shots: Avoid having procedures within 2 weeks of a flu shots or . (Avoid for 2 weeks before or after immunizations). . Barium: Avoid having a procedure within 7-10 days after having had a radiological study involving the use of radiological contrast. (Myelograms, Barium swallow or enema study). . Heart attacks: Avoid any elective procedures or surgeries for the initial 6 months after a "Myocardial Infarction" (Heart Attack). . Blood thinners: It is imperative that you stop these medications before procedures. Let us know if you if you take any blood thinner.  . Infection: Avoid procedures during or within two weeks of an infection (including chest colds or gastrointestinal problems). Symptoms associated with infections include: Localized redness, fever, chills, night sweats or profuse sweating, burning sensation when voiding, cough, congestion, stuffiness, runny nose, sore throat, diarrhea, nausea, vomiting, cold or Flu symptoms, recent or current infections. It is specially important if the infection is over the area that we intend to treat. Marland Kitchen Heart and lung problems: Symptoms that may suggest an active cardiopulmonary problem include: cough, chest pain, breathing difficulties or shortness of breath, dizziness, ankle swelling, uncontrolled  high or unusually low blood pressure, and/or palpitations. If you are experiencing any of these symptoms, cancel your  procedure and contact your primary care physician for an evaluation.  Remember:  Regular Business hours are:  Monday to Thursday 8:00 AM to 4:00 PM  Provider's Schedule: Delano MetzFrancisco Naveira, MD:  Procedure days: Tuesday and Thursday 7:30 AM to 4:00 PM  Edward JollyBilal Lateef, MD:  Procedure days: Monday and Wednesday 7:30 AM to 4:00 PM ____________________________________________________________________________________________    Selective Nerve Root Block Patient Information  Description: Specific nerve roots exit the spinal canal and these nerves can be compressed and inflamed by a bulging disc and bone spurs.  By injecting steroids on the nerve root, we can potentially decrease the inflammation surrounding these nerves, which often leads to decreased pain.  Also, by injecting local anesthesia on the nerve root, this can provide us helpful information to give to your referring doctor if it decreases your pain.  Selective nerve root blocks can be done along the spine from the neck to the low back depending on the location of your pain.   After numbing the skin with local anesthesia, a small needle is passed to the nerve root and the position of the needle is verified using x-ray pictures.  After the needle is in correct position, we then deposit the medication.  You may experience a pressure sensation while this is being done.  The entire block usually lasts less than 15 minutes.  Conditions that may be treated with selective nerve root blocks:  Low back and leg pain  Spinal stenosis  Diagnostic block prior to potential surgery  Neck and arm pain  Post laminectomy syndrome  Preparation for the injection:  1. Do not eat any solid food or dairy products within 8 hours of your appointment. 2. You may drink clear liquids up to 3 hours before an appointment.  Clear liquids include water, black coffee, juice or soda.  No milk or cream please. 3. You may take your regular medications, including  pain medications, with a sip of water before your appointment.  Diabetics should hold regular insulin (if taken separately) and take 1/2 normal NPH dose the morning of the procedure.  Carry some sugar containing items with you to your appointment. 4. A driver must accompany you and be prepared to drive you home after your procedure. 5. Bring all your current medications with you. 6. An IV may be inserted and sedation may be given at the discretion of the physician. 7. A blood pressure cuff, EKG, and other monitors will often be applied during the procedure.  Some patients may need to have extra oxygen administered for a short period. 8. You will be asked to provide medical information, including allergies, prior to the procedure.  We must know immediately if you are taking blood  Thinners (like Coumadin) or if you are allergic to IV iodine contrast (dye).  Possible side-effects: All are usually temporary  Bleeding from needle site  Light headedness  Numbness and tingling  Decreased blood pressure  Weakness in arms/legs  Pressure sensation in back/neck  Pain at injection site (several days)  Possible complications: All are extremely rare  Infection  Nerve injury  Spinal headache (a headache wore with upright position)  Call if you experience:  Fever/chills associated with headache or increased back/neck pain  Headache worsened by an upright position  New onset weakness or numbness of an extremity below the injection site  Hives or difficulty breathing (go to the emergency  room)  Inflammation or drainage at the injection site(s)  Severe back/neck pain greater than usual  New symptoms which are concerning to you  Please note:  Although the local anesthetic injected can often make your back or neck feel good for several hours after the injection the pain will likely return.  It takes 3-5 days for steroids to work on the nerve root. You may not notice any pain relief  for at least one week.  If effective, we will often do a series of 3 injections spaced 3-6 weeks apart to maximally decrease your pain.    If you have any questions, please call [phone] [facility]

## 2017-10-06 NOTE — Progress Notes (Signed)
Safety precautions to be maintained throughout the outpatient stay will include: orient to surroundings, keep bed in low position, maintain call bell within reach at all times, provide assistance with transfer out of bed and ambulation.  

## 2017-10-06 NOTE — Progress Notes (Signed)
Patient's Name: Tommy Gaines  MRN: [id number]  Referring Provider: Parke Poisson, DO  DOB: [birth year]  PCP: Parke Poisson, DO  DOS: 10/06/2017  Note by: Gillis Santa, MD  Service setting: Ambulatory outpatient  Specialty: Interventional Pain Management  Location: [facility] (AMB) Pain Management Facility    Patient type: Established   Primary Reason(s) for Visit: Evaluation of chronic illnesses with exacerbation, or progression (Level of risk: moderate) CC: Follow-up (following knee x-rays )  HPI  Mr. Tommy Gaines is a 52 y.o. year old, male patient, who comes today for a follow-up evaluation. He has Chronic airway obstruction ([place]); Chronic pain of both knees; Chronic pain of both shoulders; Tendonitis of elbow, left; Obesity; History of replacement of both shoulder joints; GERD (gastroesophageal reflux disease); Chronic pain syndrome; Primary osteoarthritis of both knees; and Primary osteoarthritis of both shoulders on their problem list. Mr. Tommy Gaines was last seen on 09/10/2017. His primarily concern today is the Follow-up (following knee x-rays )  Pain Assessment: Location: Right, Left Knee Radiating: Denies. However, pain in ankles bilateral as well  Onset: More than a month ago Duration: Chronic pain Quality: Constant, Aching Severity: 7 /10 (subjective, self-reported pain score)  Note: Reported level is compatible with observation.                         When using our objective Pain Scale, levels between 6 and 10/10 are said to belong in an emergency room, as it progressively worsens from a 6/10, described as severely limiting, requiring emergency care not usually available at an outpatient pain management facility. At a 6/10 level, communication becomes difficult and requires great effort. Assistance to reach the emergency department may be required. Facial flushing and profuse sweating along with potentially dangerous increases in heart rate and blood pressure will be evident. Effect on ADL: "Unable  to do much, very limiting and inable to work"  Timing: Constant Modifying factors: Denies BP: (!) 167/100  HR: 74  Further details on both, my assessment(s), as well as the proposed treatment plan, please see below.  Laboratory Chemistry  Inflammation Markers (CRP: Acute Phase) (ESR: Chronic Phase) No results found for: CRP, ESRSEDRATE, LATICACIDVEN                       Rheumatology Markers No results found for: RF, ANA, LABURIC, URICUR, LYMEIGGIGMAB, LYMEABIGMQN, HLAB27                      Renal Function Markers Lab Results  Component Value Date   BUN 10 04/04/2011   CREATININE 1.13 04/04/2011   GFRAA >60 04/04/2011   GFRNONAA >60 04/04/2011                             Hepatic Function Markers No results found for: AST, ALT, ALBUMIN, ALKPHOS, HCVAB, AMYLASE, LIPASE, AMMONIA                      Electrolytes Lab Results  Component Value Date   NA 140 04/04/2011   K 4.1 04/04/2011   CL 106 04/04/2011   CALCIUM 8.7 04/04/2011                        Neuropathy Markers No results found for: VITAMINB12, FOLATE, HGBA1C, HIV  Bone Pathology Markers No results found for: Marveen Reeks, [id number], [id number], 25OHVITD1, 25OHVITD2, 25OHVITD3, TESTOFREE, TESTOSTERONE                       Coagulation Parameters Lab Results  Component Value Date   PLT 236 04/04/2011                        Cardiovascular Markers Lab Results  Component Value Date   TROPONINI < 0.02 04/04/2011   HGB 14.4 04/04/2011   HCT 42.3 04/04/2011                         CA Markers No results found for: CEA, CA125, LABCA2                      Note: Lab results reviewed.  Recent Diagnostic Imaging Review   Results for orders placed during the hospital encounter of 12/04/16  DG Shoulder Right   Narrative CLINICAL DATA:  Chronic right shoulder pain.  EXAM: RIGHT SHOULDER - 2+ VIEW  COMPARISON:  None.  FINDINGS: Status post right shoulder arthroplasty. No  fracture or dislocation is noted. Visualized ribs are unremarkable.  IMPRESSION: Status post right shoulder arthroplasty.   Electronically Signed   By: Marijo Conception, M.D.   On: 12/04/2016 14:07    Shoulder-L DG:  Results for orders placed during the hospital encounter of 12/04/16  DG Shoulder Left   Narrative CLINICAL DATA:  Chronic left shoulder pain.  EXAM: LEFT SHOULDER - 2+ VIEW  COMPARISON:  None.  FINDINGS: Status post left shoulder arthroplasty. No acute fracture or dislocation is noted. Visualized ribs appear normal.  IMPRESSION: No acute abnormality seen in the left shoulder.   Electronically Signed   By: Marijo Conception, M.D.   On: 12/04/2016 14:08    Lumbar DG Bending views:  Results for orders placed during the hospital encounter of 12/04/16  DG Lumbar Spine Complete W/Bend   Narrative CLINICAL DATA:  Chronic low back pain.  EXAM: LUMBAR SPINE - COMPLETE WITH BENDING VIEWS  COMPARISON:  None.  FINDINGS: Lumbar vertebral bodies intact. Grade 1 L4-5 anterolisthesis with chronic bilateral L4 pars interarticularis defects. 10 mm L4-5 anterolisthesis in neutral, 9 mm in flexion and 9 mm in extension. Moderate to severe L4-5 disc height loss with endplate sclerosis and marginal spurring. Moderate lower lumbar facet arthropathy. No destructive bony lesions. LEFT acetabular screws. Sacroiliac joints are symmetric. No destructive bony lesions. Soft tissue planes are nonsuspicious.  IMPRESSION: Grade 1 L4-5 anterolisthesis on the basis of chronic L4 pars interarticularis defects without appreciable dynamic instability.   Electronically Signed   By: Elon Alas M.D.   On: 12/04/2016 14:14     Sacroiliac Joint Imaging: Sacroiliac Joint DG:  Results for orders placed during the hospital encounter of 12/04/16  DG Si Joints   Narrative CLINICAL DATA:  Chronic pain.  Pain management evaluation.  EXAM: BILATERAL SACROILIAC JOINTS - 3+  VIEW  COMPARISON:  None.  FINDINGS: The sacroiliac joint spaces are maintained and there is no evidence of arthropathy. No other bone abnormalities are seen.  Left hip osteoarthritis with spurring and superolateral joint narrowing. Screws overlap the left acetabulum.  IMPRESSION: 1. 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 2. Postoperative left acetabulum with asymmetric left hip osteoarthritis.   Electronically Signed   By: Monte Fantasia M.D.   On: 12/04/2016 14:06     Hip-L  DG 2-3 views:  Results for orders placed during the hospital encounter of 12/04/16  DG HIP UNILAT W OR W/O PELVIS 2-3 VIEWS LEFT   Addendum ADDENDUM REPORT: 12/04/2016 14:45  ADDENDUM: Normal right hip.   Electronically Signed   By: Marijo Conception, M.D.   On: 12/04/2016 14:45       Sabino Dick, MD 12/04/2016  2:47 PM         Narrative CLINICAL DATA:  Chronic left hip pain.  EXAM: DG HIP (WITH OR WITHOUT PELVIS) 2-3V LEFT  COMPARISON:  None.  FINDINGS: There is no evidence of hip fracture or dislocation. Surgical screws are seen in the medial left acetabulum. Mild joint space narrowing is noted with osteophyte formation.  IMPRESSION: Postsurgical and degenerative changes are noted involving the left hip. No acute abnormality is noted.  Electronically Signed: By: Marijo Conception, M.D. On: 12/04/2016 14:10    Hip Results for orders placed during the hospital encounter of 09/10/17  DG Knee 1-2 Views Right   Narrative CLINICAL DATA:  Chronic bilateral knee pain.  EXAM: LEFT KNEE - 1-2 VIEW; RIGHT KNEE - 1-2 VIEW  COMPARISON:  Right knee x-rays dated September 28, 2013.  FINDINGS: Left knee: No acute fracture or dislocation. Fractured cerclage wire through the patella. Severe patellofemoral joint space narrowing with large marginal osteophytes. Probable trace joint effusion. Osteopenia. Chondrocalcinosis of the menisci. Soft tissues are unremarkable.  Right knee: No acute fracture or dislocation.  Moderate medial compartment joint space narrowing, progressed when compared to prior study. Tricompartmental osteophytes. No joint effusion. Osteopenia. Chondrocalcinosis of the menisci. Soft tissues are unremarkable.  IMPRESSION: 1. Severe left patellofemoral osteoarthritis. 2. Progressed moderate right medial compartment osteoarthritis. 3. Chondrocalcinosis of the bilateral menisci, which can be seen with CPPD arthropathy.   Electronically Signed   By: Titus Dubin M.D.   On: 09/10/2017 15:46    Knee-L DG 1-2 views:  Results for orders placed during the hospital encounter of 09/10/17  DG Knee 1-2 Views Left   Narrative CLINICAL DATA:  Chronic bilateral knee pain.  EXAM: LEFT KNEE - 1-2 VIEW; RIGHT KNEE - 1-2 VIEW  COMPARISON:  Right knee x-rays dated September 28, 2013.  FINDINGS: Left knee: No acute fracture or dislocation. Fractured cerclage wire through the patella. Severe patellofemoral joint space narrowing with large marginal osteophytes. Probable trace joint effusion. Osteopenia. Chondrocalcinosis of the menisci. Soft tissues are unremarkable.  Right knee: No acute fracture or dislocation. Moderate medial compartment joint space narrowing, progressed when compared to prior study. Tricompartmental osteophytes. No joint effusion. Osteopenia. Chondrocalcinosis of the menisci. Soft tissues are unremarkable.  IMPRESSION: 1. Severe left patellofemoral osteoarthritis. 2. Progressed moderate right medial compartment osteoarthritis. 3. Chondrocalcinosis of the bilateral menisci, which can be seen with CPPD arthropathy.   Electronically Signed   By: Titus Dubin M.D.   On: 09/10/2017 15:46     Ankle Imaging: Ankle-R DG Complete:  Results for orders placed during the hospital encounter of 12/04/16  DG Ankle Complete Right   Narrative CLINICAL DATA:  Chronic right ankle pain. Pain management evaluation.  EXAM: RIGHT ANKLE - COMPLETE 3+ VIEW  COMPARISON:   None in PACs  FINDINGS: The bones are subjectively adequately mineralized. There is no acute or old malleolar fracture. The talar dome is intact. There are soft tissue calcifications noted adjacent to the lateral aspect of the talus. There are plantar and Achilles region calcaneal spurs. The subtalar joints are grossly normal. There is mild soft tissue swelling  diffusely.  IMPRESSION: No acute bony abnormality of the right ankle. Soft tissue calcification along the lateral aspect of the talus inferior to the lateral malleolus may be degenerative or post traumatic.   Electronically Signed   By: David  Martinique M.D.   On: 12/04/2016 14:06    Complexity Note: Imaging results reviewed. Results shared with Mr. Tommy Gaines, using Layman's terms. Today I personally and independently reviewed the study images pertinent to Mr. Tommy Gaines's problem.            I have personally examined the images and I agree with the reported  findings. I find no additional pain-related pathology to add to the report.  Meds   Current Outpatient Medications:  .  albuterol (PROVENTIL HFA;VENTOLIN HFA) 108 (90 Base) MCG/ACT inhaler, Inhale 2 puffs into the lungs 3 (three) times daily., Disp: , Rfl:  .  amLODipine (NORVASC) 5 MG tablet, Take 5 mg by mouth daily., Disp: , Rfl:  .  aspirin EC 81 MG tablet, Take 81 mg by mouth., Disp: , Rfl:  .  atorvastatin (LIPITOR) 10 MG tablet, Take 10 mg by mouth daily., Disp: , Rfl:  .  diclofenac sodium (VOLTAREN) 1 % GEL, Apply 1 application topically as needed., Disp: , Rfl:  .  FLUoxetine (PROZAC) 20 MG capsule, Take 20 mg by mouth daily., Disp: , Rfl:  .  furosemide (LASIX) 20 MG tablet, Take 20 mg by mouth daily., Disp: , Rfl:  .  gabapentin (NEURONTIN) 300 MG capsule, Take 2 capsules (600 mg total) by mouth 3 (three) times daily., Disp: 180 capsule, Rfl: 2 .  omeprazole (PRILOSEC) 20 MG capsule, Take 20 mg by mouth daily., Disp: , Rfl:  .  tiotropium (SPIRIVA) 18 MCG  inhalation capsule, Place 18 mcg into inhaler and inhale daily., Disp: , Rfl:  .  baclofen (LIORESAL) 10 MG tablet, Take 10 mg by mouth as needed., Disp: , Rfl:  .  budesonide-formoterol (SYMBICORT) 160-4.5 MCG/ACT inhaler, Inhale 1 puff into the lungs 2 (two) times daily., Disp: , Rfl:  .  celecoxib (CELEBREX) 200 MG capsule, Take 1 capsule (200 mg total) by mouth daily., Disp: 30 capsule, Rfl: 1 .  tiZANidine (ZANAFLEX) 4 MG tablet, Take 4 mg by mouth every 8 (eight) hours as needed for muscle spasms., Disp: , Rfl:   ROS  Constitutional: Denies any fever or chills Gastrointestinal: No reported hemesis, hematochezia, vomiting, or acute GI distress Musculoskeletal: Denies any acute onset joint swelling, redness, loss of ROM, or weakness Neurological: No reported episodes of acute onset apraxia, aphasia, dysarthria, agnosia, amnesia, paralysis, loss of coordination, or loss of consciousness  Allergies  Mr. Tommy Gaines is allergic to acetaminophen and lidocaine.  [place]  Drug: Mr. Tommy Gaines  reports that he does not use drugs. Alcohol:  reports that he does not drink alcohol. Tobacco:  reports that he has been smoking. He has quit using smokeless tobacco. Medical:  has a past medical history of Arthritis, COPD (chronic obstructive pulmonary disease) ([place]), Depression, Diverticulitis, Hyperlipidemia, Hypertension, and Stroke ([place]) (2016). Surgical: Mr. Tommy Gaines  has a past surgical history that includes intestinal rupture (2012) and Joint replacement (Bilateral). Family: family history includes Cancer in his mother; Diabetes in his mother; Heart disease in his father.  Constitutional Exam  General appearance: Well nourished, well developed, and well hydrated. In no apparent acute distress Vitals:   10/06/17 1406 10/06/17 1410  BP: (!) 194/112 (!) 167/100  Pulse: 73 74  Resp: 18   Temp: 98.6 F (37 C)  SpO2: 97%   Weight: 275 lb (124.7 kg)   Height: 6' 1.5" (1.867 m)    BMI Assessment:  Estimated body mass index is 35.79 kg/m as calculated from the following:   Height as of this encounter: 6' 1.5" (1.867 m).   Weight as of this encounter: 275 lb (124.7 kg).  BMI interpretation table: BMI level Category Range association with higher incidence of chronic pain  <18 kg/m2 Underweight   18.5-24.9 kg/m2 Ideal body weight   25-29.9 kg/m2 Overweight Increased incidence by 20%  30-34.9 kg/m2 Obese (Class I) Increased incidence by 68%  35-39.9 kg/m2 Severe obesity (Class II) Increased incidence by 136%  >40 kg/m2 Extreme obesity (Class III) Increased incidence by 254%   Patient's current BMI Ideal Body weight  Body mass index is 35.79 kg/m. Ideal body weight: 81 kg (178 lb 10.9 oz) Adjusted ideal body weight: 98.5 kg (217 lb 3.4 oz)   BMI Readings from Last 4 Encounters:  10/06/17 35.79 kg/m  09/10/17 36.28 kg/m  10/17/16 35.14 kg/m   Wt Readings from Last 4 Encounters:  10/06/17 275 lb (124.7 kg)  09/10/17 275 lb (124.7 kg)  10/17/16 270 lb (122.5 kg)  Psych/Mental status: Alert, oriented x 3 (person, place, & time)       Eyes: PERLA Respiratory: No evidence of acute respiratory distress  Cervical Spine Area Exam  Skin & Axial Inspection: No masses, redness, edema, swelling, or associated skin lesions Alignment: Symmetrical Functional ROM: Unrestricted ROM      Stability: No instability detected Muscle Tone/Strength: Functionally intact. No obvious neuro-muscular anomalies detected. Sensory (Neurological): Unimpaired Palpation: No palpable anomalies              Upper Extremity (UE) Exam    Side: Right upper extremity  Side: Left upper extremity  Skin & Extremity Inspection: Skin color, temperature, and hair growth are WNL. No peripheral edema or cyanosis. No masses, redness, swelling, asymmetry, or associated skin lesions. No contractures.  Skin & Extremity Inspection: Skin color, temperature, and hair growth are WNL. No peripheral edema or cyanosis. No masses,  redness, swelling, asymmetry, or associated skin lesions. No contractures.  Functional ROM: Unrestricted ROM          Functional ROM: Unrestricted ROM          Muscle Tone/Strength: Functionally intact. No obvious neuro-muscular anomalies detected.  Muscle Tone/Strength: Functionally intact. No obvious neuro-muscular anomalies detected.  Sensory (Neurological): Unimpaired          Sensory (Neurological): Unimpaired          Palpation: No palpable anomalies              Palpation: No palpable anomalies              Provocative Test(s):  Phalen's test: deferred Tinel's test: deferred Apley's scratch test (touch opposite shoulder):  Action 1 (Across chest): deferred Action 2 (Overhead): deferred Action 3 (LB reach): deferred   Provocative Test(s):  Phalen's test: deferred Tinel's test: deferred Apley's scratch test (touch opposite shoulder):  Action 1 (Across chest): deferred Action 2 (Overhead): deferred Action 3 (LB reach): deferred    Thoracic Spine Area Exam  Skin & Axial Inspection: No masses, redness, or swelling Alignment: Symmetrical Functional ROM: Unrestricted ROM Stability: No instability detected Muscle Tone/Strength: Functionally intact. No obvious neuro-muscular anomalies detected. Sensory (Neurological): Unimpaired Muscle strength & Tone: No palpable anomalies Lumbar Spine Area Exam  Skin & Axial Inspection: No masses, redness, or swelling Alignment: Symmetrical Functional ROM: Decreased ROM  affecting primarily the left Stability: No instability detected Muscle Tone/Strength: Functionally intact. No obvious neuro-muscular anomalies detected. Sensory (Neurological): Musculoskeletal pain pattern Palpation: No palpable anomalies       Provocative Tests: Lumbar Hyperextension/rotation test: (+) bilaterally for facet joint pain. Lumbar quadrant test (Kemp's test): deferred today       Lumbar Lateral bending test: deferred today       Patrick's Maneuver: deferred today                    FABER test: deferred today                   Thigh-thrust test: deferred today       S-I compression test: deferred today       S-I distraction test: deferred today        Gait & Posture Assessment  Ambulation: Unassisted Gait: Relatively normal for age and body habitus Posture: WNL   Lower Extremity Exam    Side: Right lower extremity  Side: Left lower extremity  Stability: No instability observed          Stability: No instability observed          Skin & Extremity Inspection: Skin color, temperature, and hair growth are WNL. No peripheral edema or cyanosis. No masses, redness, swelling, asymmetry, or associated skin lesions. No contractures.  Skin & Extremity Inspection: Skin color, temperature, and hair growth are WNL. No peripheral edema or cyanosis. No masses, redness, swelling, asymmetry, or associated skin lesions. No contractures.  Functional ROM: Decreased ROM for knee joint          Functional ROM: Decreased ROM for hip and knee joints          Muscle Tone/Strength: Functionally intact. No obvious neuro-muscular anomalies detected.  Muscle Tone/Strength: Functionally intact. No obvious neuro-muscular anomalies detected.  Sensory (Neurological): Arthropathic arthralgia  Sensory (Neurological): Arthropathic arthralgia  Palpation: Complains of area being tender to palpation  Palpation: Complains of area being tender to palpation    Assessment  Primary Diagnosis & Pertinent Problem List: The primary encounter diagnosis was Bilateral primary osteoarthritis of knee. Diagnoses of Chronic pain syndrome, Lumbar spondylosis, and Chronic pain of both knees were also pertinent to this visit.  Status Diagnosis  Persistent Persistent Persistent 1. Bilateral primary osteoarthritis of knee   2. Chronic pain syndrome   3. Lumbar spondylosis   4. Chronic pain of both knees     General Recommendations: The pain condition that the patient suffers from is best  treated with a multidisciplinary approach that involves an increase in physical activity to prevent de-conditioning and worsening of the pain cycle, as well as psychological counseling (formal and/or informal) to address the co-morbid psychological affects of pain. Treatment will often involve judicious use of pain medications and interventional procedures to decrease the pain, allowing the patient to participate in the physical activity that will ultimately produce long-lasting pain reductions. The goal of the multidisciplinary approach is to return the patient to a higher level of overall function and to restore their ability to perform activities of daily living.  52 year old male with a history of chronic bilateral knee pain secondary to bilateral knee primary osteoarthritis.  Patient follows up after his bilateral knee x-rays.  Patient does have severe left patellofemoral osteoarthritis as well as right medial compartmental osteoarthritis.  He has chondrocalcinosis of bilateral menisci which can be seen with CPPD arthropathy.  I discussed these findings with the patient.  Patient will likely  need a knee replacement surgery in the future for the left and possibly the right knee.  He wants to avoid this.  Patient's Medicaid did not fill his diclofenac due to insurance approval.  We will trial Celebrex 200 mg daily instead today.  I have instructed the patient to not take any other NSAIDs while he is on this medication including ibuprofen, Aleve, Goody's powder.  Also discussed bilateral genicular nerve block with the patient for his bilateral knee osteoarthritis.  Risks and benefits were discussed.  Patient would like to proceed.    Plan: -Celebrex prescription as below.  Patient instructed to refrain from any other NSAIDs including ibuprofen, naproxen, Goody's powder while he is on Celebrex.  Also recommended patient take this after a meal and to maintain hydration while he is on this. -Bilateral  genicular nerve block below with possible radio frequency ablation thereafter (will avoid Lidocaine, only use Ropivacaine)  Plan of Care  Pharmacotherapy (Medications Ordered): Meds ordered this encounter  Medications  . celecoxib (CELEBREX) 200 MG capsule    Sig: Take 1 capsule (200 mg total) by mouth daily.    Dispense:  30 capsule    Refill:  1   Lab-work, procedure(s), and/or referral(s): Orders Placed This Encounter  Procedures  . GENICULAR NERVE BLOCK   Time Note: Greater than 50% of the 25 minute(s) of face-to-face time spent with Mr. Tommy Gaines, was spent in counseling/coordination of care regarding: Mr. Tommy Gaines primary cause of pain, the results of his recent test(s), the significance of each one oth the test(s) anomalies and it's corresponding characteristic pain pattern(s), the treatment plan, treatment alternatives, the risks and possible complications of proposed treatment, medication side effects, going over the informed consent, the appropriate use of his medications, realistic expectations and the goals of pain management (increased in functionality).  Provider-requested follow-up: Return in about 3 weeks (around 10/27/2017) for Procedure.   Time Note: Greater than 50% of the 25 minute(s) of face-to-face time spent with Mr. Tommy Gaines, was spent in counseling/coordination of care regarding: Mr. Tommy Gaines primary cause of pain, the results of his recent test(s), the significance of each one oth the test(s) anomalies and it's corresponding characteristic pain pattern(s), the treatment plan, treatment alternatives, the risks and possible complications of proposed treatment, going over the informed consent, realistic expectations, the goals of pain management (increased in functionality) and the need to bring and keep the BMI below 30.  Future Appointments  Date Time Provider [place]  10/27/2017 11:30 AM Gillis Santa, MD [facility] None    Primary Care Physician: Parke Poisson,  DO Location: [facility] Note by: Gillis Santa, M.D Date: 10/06/2017; Time: 4:05 PM  Patient Instructions  Celecoxob 251m ready to be pick up at pharmacy.  Schedule Genicular Nerve block.    ____________________________________________________________________________________________  Preparing for Procedure with Sedation  Instructions: . Oral Intake: Do not eat or drink anything for at least 8 hours prior to your procedure. . Transportation: Public transportation is not allowed. Bring an adult driver. The driver must be physically present in our waiting room before any procedure can be started. .Marland KitchenPhysical Assistance: Bring an adult physically capable of assisting you, in the event you need help. This adult should keep you company at home for at least 6 hours after the procedure. . Blood Pressure Medicine: Take your blood pressure medicine with a sip of water the morning of the procedure. . Blood thinners: Notify our staff if you are taking any blood thinners. Depending on  which one you take, there will be specific instructions on how and when to stop it. . Diabetics on insulin: Notify the staff so that you can be scheduled 1st case in the morning. If your diabetes requires high dose insulin, take only  of your normal insulin dose the morning of the procedure and notify the staff that you have done so. . Preventing infections: Shower with an antibacterial soap the morning of your procedure. . Build-up your immune system: Take 1000 mg of Vitamin C with every meal (3 times a day) the day prior to your procedure. Marland Kitchen Antibiotics: Inform the staff if you have a condition or reason that requires you to take antibiotics before dental procedures. . Pregnancy: If you are pregnant, call and cancel the procedure. . Sickness: If you have a cold, fever, or any active infections, call and cancel the procedure. . Arrival: You must be in the facility at least 30 minutes prior to  your scheduled procedure. . Children: Do not bring children with you. . Dress appropriately: Bring dark clothing that you would not mind if they get stained. . Valuables: Do not bring any jewelry or valuables.  Procedure appointments are reserved for interventional treatments only. Marland Kitchen No Prescription Refills. . No medication changes will be discussed during procedure appointments. . No disability issues will be discussed.  Reasons to call and reschedule or cancel your procedure: (Following these recommendations will minimize the risk of a serious complication.) . Surgeries: Avoid having procedures within 2 weeks of any surgery. (Avoid for 2 weeks before or after any surgery). . Flu Shots: Avoid having procedures within 2 weeks of a flu shots or . (Avoid for 2 weeks before or after immunizations). . Barium: Avoid having a procedure within 7-10 days after having had a radiological study involving the use of radiological contrast. (Myelograms, Barium swallow or enema study). . Heart attacks: Avoid any elective procedures or surgeries for the initial 6 months after a "Myocardial Infarction" (Heart Attack). . Blood thinners: It is imperative that you stop these medications before procedures. Let us know if you if you take any blood thinner.  . Infection: Avoid procedures during or within two weeks of an infection (including chest colds or gastrointestinal problems). Symptoms associated with infections include: Localized redness, fever, chills, night sweats or profuse sweating, burning sensation when voiding, cough, congestion, stuffiness, runny nose, sore throat, diarrhea, nausea, vomiting, cold or Flu symptoms, recent or current infections. It is specially important if the infection is over the area that we intend to treat. Marland Kitchen Heart and lung problems: Symptoms that may suggest an active cardiopulmonary problem include: cough, chest pain, breathing difficulties or shortness of breath, dizziness, ankle  swelling, uncontrolled high or unusually low blood pressure, and/or palpitations. If you are experiencing any of these symptoms, cancel your procedure and contact your primary care physician for an evaluation.  Remember:  Regular Business hours are:  Monday to Thursday 8:00 AM to 4:00 PM  Provider's Schedule: Milinda Pointer, MD:  Procedure days: Tuesday and Thursday 7:30 AM to 4:00 PM  Gillis Santa, MD:  Procedure days: Monday and Wednesday 7:30 AM to 4:00 PM ____________________________________________________________________________________________    Selective Nerve Root Block Patient Information  Description: Specific nerve roots exit the spinal canal and these nerves can be compressed and inflamed by a bulging disc and bone spurs.  By injecting steroids on the nerve root, we can potentially decrease the inflammation surrounding these nerves, which often leads to decreased pain.  Also, by injecting  local anesthesia on the nerve root, this can provide [place] helpful information to give to your referring doctor if it decreases your pain.  Selective nerve root blocks can be done along the spine from the neck to the low back depending on the location of your pain.   After numbing the skin with local anesthesia, a small needle is passed to the nerve root and the position of the needle is verified using x-ray pictures.  After the needle is in correct position, we then deposit the medication.  You may experience a pressure sensation while this is being done.  The entire block usually lasts less than 15 minutes.  Conditions that may be treated with selective nerve root blocks:  Low back and leg pain  Spinal stenosis  Diagnostic block prior to potential surgery  Neck and arm pain  Post laminectomy syndrome  Preparation for the injection:  1. Do not eat any solid food or dairy products within 8 hours of your appointment. 2. You may drink clear liquids up to 3 hours before an  appointment.  Clear liquids include water, black coffee, juice or soda.  No milk or cream please. 3. You may take your regular medications, including pain medications, with a sip of water before your appointment.  Diabetics should hold regular insulin (if taken separately) and take 1/2 normal NPH dose the morning of the procedure.  Carry some sugar containing items with you to your appointment. 4. A driver must accompany you and be prepared to drive you home after your procedure. 5. Bring all your current medications with you. 6. An IV may be inserted and sedation may be given at the discretion of the physician. 7. A blood pressure cuff, EKG, and other monitors will often be applied during the procedure.  Some patients may need to have extra oxygen administered for a short period. 8. You will be asked to provide medical information, including allergies, prior to the procedure.  We must know immediately if you are taking blood  Thinners (like Coumadin) or if you are allergic to IV iodine contrast (dye).  Possible side-effects: All are usually temporary  Bleeding from needle site  Light headedness  Numbness and tingling  Decreased blood pressure  Weakness in arms/legs  Pressure sensation in back/neck  Pain at injection site (several days)  Possible complications: All are extremely rare  Infection  Nerve injury  Spinal headache (a headache wore with upright position)  Call if you experience:  Fever/chills associated with headache or increased back/neck pain  Headache worsened by an upright position  New onset weakness or numbness of an extremity below the injection site  Hives or difficulty breathing (go to the emergency room)  Inflammation or drainage at the injection site(s)  Severe back/neck pain greater than usual  New symptoms which are concerning to you  Please note:  Although the local anesthetic injected can often make your back or neck feel good for  several hours after the injection the pain will likely return.  It takes 3-5 days for steroids to work on the nerve root. You may not notice any pain relief for at least one week.  If effective, we will often do a series of 3 injections spaced 3-6 weeks apart to maximally decrease your pain.    If you have any questions, please call [phone] [facility]

## 2017-10-27 ENCOUNTER — Other Ambulatory Visit: Payer: Self-pay

## 2017-10-27 ENCOUNTER — Encounter: Payer: Self-pay | Admitting: Student in an Organized Health Care Education/Training Program

## 2017-10-27 ENCOUNTER — Ambulatory Visit (HOSPITAL_BASED_OUTPATIENT_CLINIC_OR_DEPARTMENT_OTHER): Payer: Medicaid Other | Admitting: Student in an Organized Health Care Education/Training Program

## 2017-10-27 ENCOUNTER — Ambulatory Visit
Admission: RE | Admit: 2017-10-27 | Discharge: 2017-10-27 | Disposition: A | Discharge status: Home / Self Care | Payer: Medicaid Other | Source: Ambulatory Visit | Attending: Student in an Organized Health Care Education/Training Program | Admitting: Student in an Organized Health Care Education/Training Program

## 2017-10-27 VITALS — BP 139/91 | HR 75 | Temp 98.4°F | Resp 16 | Ht 73.0 in | Wt 275.0 lb

## 2017-10-27 DIAGNOSIS — M170 Bilateral primary osteoarthritis of knee: Secondary | ICD-10-CM

## 2017-10-27 DIAGNOSIS — G894 Chronic pain syndrome: Secondary | ICD-10-CM | POA: Insufficient documentation

## 2017-10-27 DIAGNOSIS — M25561 Pain in right knee: Secondary | ICD-10-CM | POA: Diagnosis present

## 2017-10-27 DIAGNOSIS — M25562 Pain in left knee: Secondary | ICD-10-CM | POA: Diagnosis present

## 2017-10-27 MED ORDER — DEXAMETHASONE SODIUM PHOSPHATE 10 MG/ML IJ SOLN
10.0000 mg | Freq: Once | INTRAMUSCULAR | Status: AC
  Administered 2017-10-27: 10 mg
  Filled 2017-10-27: qty 1

## 2017-10-27 MED ORDER — ROPIVACAINE HCL 2 MG/ML IJ SOLN
10.0000 mL | Freq: Once | INTRAMUSCULAR | Status: AC
  Administered 2017-10-27: 10 mL
  Filled 2017-10-27: qty 10

## 2017-10-27 MED ORDER — LIDOCAINE HCL 2 % IJ SOLN
INTRAMUSCULAR | Status: AC
  Filled 2017-10-27: qty 20

## 2017-10-27 MED ORDER — LACTATED RINGERS IV SOLN
1000.0000 mL | Freq: Once | INTRAVENOUS | Status: AC
  Administered 2017-10-27: 1000 mL via INTRAVENOUS

## 2017-10-27 MED ORDER — FENTANYL CITRATE (PF) 100 MCG/2ML IJ SOLN
25.0000 ug | INTRAMUSCULAR | Status: DC | PRN
  Administered 2017-10-27: 100 ug via INTRAVENOUS
  Filled 2017-10-27: qty 2

## 2017-10-27 NOTE — Progress Notes (Signed)
Safety precautions to be maintained throughout the outpatient stay will include: orient to surroundings, keep bed in low position, maintain call bell within reach at all times, provide assistance with transfer out of bed and ambulation.  

## 2017-10-27 NOTE — Patient Instructions (Signed)
____________________________________________________________________________________________  Post-procedure Information What to expect: Most procedures involve the use of a local anesthetic (numbing medicine), and a steroid (anti-inflammatory medicine).  The local anesthetics may cause temporary numbness and weakness of the legs or arms, depending on the location of the block. This numbness/weakness may last 4-6 hours, depending on the local anesthetic used. In rare instances, it can last up to 24 hours. While numb, you must be very careful not to injure the extremity.  After any procedure, you could expect the pain to get better within 15-20 minutes. This relief is temporary and may last 4-6 hours. Once the local anesthetics wears off, you could experience discomfort, possibly more than usual, for up to 10 (ten) days. In the case of radiofrequencies, it may last up to 6 weeks. Surgeries may take up to 8 weeks for the healing process. The discomfort is due to the irritation caused by needles going through skin and muscle. To minimize the discomfort, we recommend using ice the first day, and heat from then on. The ice should be applied for 15 minutes on, and 15 minutes off. Keep repeating this cycle until bedtime. Avoid applying the ice directly to the skin, to prevent frostbite. Heat should be used daily, until the pain improves (4-10 days). Be careful not to burn yourself.  Occasionally you may experience muscle spasms or cramps. These occur as a consequence of the irritation caused by the needle sticks to the muscle and the blood that will inevitably be lost into the surrounding muscle tissue. Blood tends to be very irritating to tissues, which tend to react by going into spasm. These spasms may start the same day of your procedure, but they may also take days to develop. This late onset type of spasm or cramp is usually caused by electrolyte imbalances triggered by the steroids, at the level of the  kidney. Cramps and spasms tend to respond well to muscle relaxants, multivitamins (some are triggered by the procedure, but may have their origins in vitamin deficiencies), and "Gatorade", or any sports drinks that can replenish any electrolyte imbalances. (If you are a diabetic, ask your pharmacist to get you a sugar-free brand.) Warm showers or baths may also be helpful. Stretching exercises are highly recommended.  General Instructions:  Be alert for signs of possible infection: redness, swelling, heat, red streaks, elevated temperature, and/or fever. These typically appear 4 to 6 days after the procedure. Immediately notify your doctor if you experience unusual bleeding, difficulty breathing, or loss of bowel or bladder control. If you experience increased pain, do not increase your pain medicine intake, unless instructed by your pain physician.  Post-Procedure Care:  Be careful in moving about. Muscle spasms in the area of the injection may occur. Applying ice or heat to the area is often helpful. The incidence of spinal headaches after epidural injections ranges between 1.4% and 6%. If you develop a headache that does not seem to respond to conservative therapy, please let your physician know. This can be treated with an epidural blood patch.   Post-procedure numbness or redness is to be expected, however it should average 4 to 6 hours. If numbness and weakness of your extremities begins to develop 4 to 6 hours after your procedure, and is felt to be progressing and worsening, immediately contact your physician.  Diet:  If you experience nausea, do not eat until this sensation goes away. If you had a "Stellate Ganglion Block" for upper extremity "Reflex Sympathetic Dystrophy", do not eat or   drink until your hoarseness goes away. In any case, always start with liquids first and if you tolerate them well, then slowly progress to more solid foods.  Activity:  For the first 4 to 6 hours after the  procedure, use caution in moving about as you may experience numbness and/or weakness. Use caution in cooking, using household electrical appliances, and climbing steps. If you need to reach your Doctor call our office: [phone] (During business hours) or [phone] (After business hours).  Business Hours: Monday-Thursday 8:00 am - 4:00 PM    Fridays: Closed     In case of an emergency: In case of emergency, call 911 or go to the nearest emergency room and have the physician there call us.  Interpretation of Procedure Every nerve block has two components: a diagnostic component, and a treatment component. Unrealistic expectations are the most common causes of "perceived failure".  In a perfect world, a single nerve block should be able to completely and permanently eliminate the pain. Sadly, the world is not perfect.  Most pain management nerve blocks are performed using local anesthetics and steroids. Steroids are responsible for any long-term benefit that you may experience. Their purpose is to decrease any chronic swelling that may exist in the area. Steroids begin to work immediately after being injected. However, most patients will not experience any benefits until 5 to 10 days after the injection, when the swelling has come down to the point where they can tell a difference. Steroids will only help if there is swelling to be treated. As such, they can assist with the diagnosis. If effective, they suggest an inflammatory component to the pain, and if ineffective, they rule out inflammation as the main cause or component of the problem. If the problem is one of mechanical compression, you will get no benefit from those steroids.   In the case of local anesthetics, they have a crucial role in the diagnosis of your condition. Most will begin to work within15 to 20 minutes after injection. The duration will depend on the type used (short- vs. Long-acting). It is of outmost importance that  patients keep tract of their pain, after the procedure. To assist with this matter, a "Post-procedure Pain Diary" is provided. Make sure to complete it and to bring it back to your follow-up appointment.  As long as the patient keeps accurate, detailed records of their symptoms after every procedure, and returns to have those interpreted, every procedure will provide us with invaluable information. Even a block that does not provide the patient with any relief, will always provide us with information about the mechanism and the origin of the pain. The only time a nerve block can be considered a waste of time is when patients do not keep track of the results, or do not keep their post-procedure appointment.  Reporting the results back to your physician The Pain Score  Pain is a subjective complaint. It cannot be seen, touched, or measured. We depend entirely on the patient's report of the pain in order to assess your condition and treatment. To evaluate the pain, we use a pain scale, where "0" means "No Pain", and a "10" is "the worst possible pain that you can even imagine" (i.e. something like been eaten alive by a shark or being torn apart by a lion).   Use the Pain Scale provided. You will frequently be asked to rate your pain. Please be accurate, remember that medical decisions will be based on your   responses. Please do not rate your pain above a 10. Doing so is actually interpreted as "symptom magnification" (exaggeration). To put this into perspective, when you tell us that your pain is at a 10 (ten), what you are saying is that there is nothing we can do to make this pain any worse. (Carefully think about that.) ____________________________________________________________________________________________   Pain Management Discharge Instructions  General Discharge Instructions :  If you need to reach your doctor call: Monday-Friday 8:00 am - 4:00 pm at [phone] or toll free [phone].   After clinic hours [phone] to have operator reach doctor.  Bring all of your medication bottles to all your appointments in the pain clinic.  To cancel or reschedule your appointment with Pain Management please remember to call 24 hours in advance to avoid a fee.  Refer to the educational materials which you have been given on: General Risks, I had my Procedure. Discharge Instructions, Post Sedation.  Post Procedure Instructions:  The drugs you were given will stay in your system until tomorrow, so for the next 24 hours you should not drive, make any legal decisions or drink any alcoholic beverages.  You may eat anything you prefer, but it is better to start with liquids then soups and crackers, and gradually work up to solid foods.  Please notify your doctor immediately if you have any unusual bleeding, trouble breathing or pain that is not related to your normal pain.  Depending on the type of procedure that was done, some parts of your body may feel week and/or numb.  This usually clears up by tonight or the next day.  Walk with the use of an assistive device or accompanied by an adult for the 24 hours.  You may use ice on the affected area for the first 24 hours.  Put ice in a Ziploc bag and cover with a towel and place against area 15 minutes on 15 minutes off.  You may switch to heat after 24 hours. 

## 2017-10-27 NOTE — Progress Notes (Signed)
Patient's Name: Tommy Gaines  MRN: [id number]  Referring Provider: Marta Antu, DO  DOB: [birth year]  PCP: Marta Antu, DO  DOS: 10/27/2017  Note by: Edward Jolly, MD  Service setting: Ambulatory outpatient  Specialty: Interventional Pain Management  Patient type: Established  Location: [facility] (AMB) Pain Management Facility  Visit type: Interventional Procedure   Primary Reason for Visit: Interventional Pain Management Treatment. CC: Knee Pain (bilateral)  Procedure:          Anesthesia, Analgesia, Anxiolysis:  Type: Genicular Nerves Block (Superior-lateral, Superior-medial, and Inferior-medial Genicular Nerves) #1  CPT: 64450      Primary Purpose: Diagnostic Region: Lateral, Anterior, and Medial aspects of the knee joint, above and below the knee joint proper. Level: Superior and inferior to the knee joint. Target Area: For Genicular Nerve block(s), the targets are: the superior-lateral genicular nerve, located in the lateral distal portion of the femoral shaft as it curves to form the lateral epicondyle, in the region of the distal femoral metaphysis; the superior-medial genicular nerve, located in the medial distal portion of the femoral shaft as it curves to form the medial epicondyle; and the inferior-medial genicular nerve, located in the medial, proximal portion of the tibial shaft, as it curves to form the medial epicondyle, in the region of the proximal tibial metaphysis. Approach: Anterior, percutaneous, ipsilateral approach. Laterality: Bilateral Position: Modified Fowler's position with pillows under the targeted knee(s).  Type: Moderate (Conscious) Sedation combined with Local Anesthesia Indication(s): Analgesia and Anxiety Route: Intravenous (IV) IV Access: Secured Sedation: Meaningful verbal contact was maintained at all times during the procedure  Local Anesthetic: 0.2 % ropivacaine   Indications: 1. Bilateral primary osteoarthritis of knee   2. Chronic pain syndrome    Pain  Score: Pre-procedure: 7 /10 Post-procedure: 6 /10  Pre-op Assessment:  Mr. Peri is a 52 y.o. (year old), male patient, seen today for interventional treatment. He  has a past surgical history that includes intestinal rupture (2012) and Joint replacement (Bilateral). Mr. Demichele has a current medication list which includes the following prescription(s): albuterol, amlodipine, aspirin ec, atorvastatin, baclofen, celecoxib, diclofenac sodium, fluoxetine, furosemide, omeprazole, tiotropium, tizanidine, budesonide-formoterol, and gabapentin, and the following Facility-Administered Medications: fentanyl. His primarily concern today is the Knee Pain (bilateral)  Initial Vital Signs:  Pulse/HCG Rate: 85ECG Heart Rate: 74 Temp: 98.4 F (36.9 C) Resp: 16 BP: (!) 148/95 SpO2: 98 %  BMI: Estimated body mass index is 36.28 kg/m as calculated from the following:   Height as of this encounter: 6\' 1"  (1.854 m).   Weight as of this encounter: 275 lb (124.7 kg).  Risk Assessment: Allergies: Reviewed. He is allergic to acetaminophen and lidocaine.  Allergy Precautions: None required Coagulopathies: Reviewed. None identified.  Blood-thinner therapy: None at this time Active Infection(s): Reviewed. None identified. Mr. Satkowiak is afebrile  Site Confirmation: Mr. Fronheiser was asked to confirm the procedure and laterality before marking the site Procedure checklist: Completed Consent: Before the procedure and under the influence of no sedative(s), amnesic(s), or anxiolytics, the patient was informed of the treatment options, risks and possible complications. To fulfill our ethical and legal obligations, as recommended by the American Medical Association's Code of Ethics, I have informed the patient of my clinical impression; the nature and purpose of the treatment or procedure; the risks, benefits, and possible complications of the intervention; the alternatives, including doing nothing; the risk(s) and  benefit(s) of the alternative treatment(s) or procedure(s); and the risk(s) and benefit(s) of doing nothing. The patient was provided information  about the general risks and possible complications associated with the procedure. These may include, but are not limited to: failure to achieve desired goals, infection, bleeding, organ or nerve damage, allergic reactions, paralysis, and death. In addition, the patient was informed of those risks and complications associated to the procedure, such as failure to decrease pain; infection; bleeding; organ or nerve damage with subsequent damage to sensory, motor, and/or autonomic systems, resulting in permanent pain, numbness, and/or weakness of one or several areas of the body; allergic reactions; (i.e.: anaphylactic reaction); and/or death. Furthermore, the patient was informed of those risks and complications associated with the medications. These include, but are not limited to: allergic reactions (i.e.: anaphylactic or anaphylactoid reaction(s)); adrenal axis suppression; blood sugar elevation that in diabetics may result in ketoacidosis or comma; water retention that in patients with history of congestive heart failure may result in shortness of breath, pulmonary edema, and decompensation with resultant heart failure; weight gain; swelling or edema; medication-induced neural toxicity; particulate matter embolism and blood vessel occlusion with resultant organ, and/or nervous system infarction; and/or aseptic necrosis of one or more joints. Finally, the patient was informed that Medicine is not an exact science; therefore, there is also the possibility of unforeseen or unpredictable risks and/or possible complications that may result in a catastrophic outcome. The patient indicated having understood very clearly. We have given the patient no guarantees and we have made no promises. Enough time was given to the patient to ask questions, all of which were answered to  the patient's satisfaction. Mr. Leino has indicated that he wanted to continue with the procedure. Attestation: I, the ordering provider, attest that I have discussed with the patient the benefits, risks, side-effects, alternatives, likelihood of achieving goals, and potential problems during recovery for the procedure that I have provided informed consent. Date  Time: 10/27/2017 10:55 AM  Pre-Procedure Preparation:  Monitoring: As per clinic protocol. Respiration, ETCO2, SpO2, BP, heart rate and rhythm monitor placed and checked for adequate function Safety Precautions: Patient was assessed for positional comfort and pressure points before starting the procedure. Time-out: I initiated and conducted the "Time-out" before starting the procedure, as per protocol. The patient was asked to participate by confirming the accuracy of the "Time Out" information. Verification of the correct person, site, and procedure were performed and confirmed by me, the nursing staff, and the patient. "Time-out" conducted as per Joint Commission's Universal Protocol (UP.01.01.01). Time: 1220  Description of Procedure:          Area Prepped: Entire knee area, from mid-thigh to mid-shin, lateral, anterior, and medial aspects. Prepping solution: ChloraPrep (2% chlorhexidine gluconate and 70% isopropyl alcohol) Safety Precautions: Aspiration looking for blood return was conducted prior to all injections. At no point did we inject any substances, as a needle was being advanced. No attempts were made at seeking any paresthesias. Safe injection practices and needle disposal techniques used. Medications properly checked for expiration dates. SDV (single dose vial) medications used. Latex Allergy precautions taken.   Description of the Procedure: Protocol guidelines were followed. The patient was placed in position over the procedure table. The target area was identified and the area prepped in the usual manner. Skin & deeper  tissues infiltrated with local anesthetic. Appropriate amount of time allowed to pass for local anesthetics to take effect. The procedure needles were then advanced to the target area. Proper needle placement secured. Negative aspiration confirmed. Solution injected in intermittent fashion, asking for systemic symptoms every 0.5cc of injectate. The needles  were then removed and the area cleansed, making sure to leave some of the prepping solution back to take advantage of its long term bactericidal properties.  Vitals:   10/27/17 1240 10/27/17 1250 10/27/17 1301 10/27/17 1310  BP: (!) 148/102 (!) 147/86 (!) 158/85 (!) 139/91  Pulse: 75     Resp: 16 16 16 16   Temp:      TempSrc:      SpO2: 97% 98% 98% 99%  Weight:      Height:        Start Time: 1220 hrs. End Time: 1238 hrs. Materials:  Needle(s) Type: Regular needle Gauge: 22G Length: 3.5-in Medication(s): Please see orders for medications and dosing details. 6 cc solution made of 5 cc of 0.2% ropivacaine, 1 cc of Decadron 10 mg/cc.  2 cc injected at each level above for the left knee 6 cc solution made of 5 cc of 0.2% ropivacaine, 1 cc of Decadron 10 mg/cc.  2 cc injected at each level above for the right knee Imaging Guidance (Non-Spinal):          Type of Imaging Technique: Fluoroscopy Guidance (Non-Spinal) Indication(s): Assistance in needle guidance and placement for procedures requiring needle placement in or near specific anatomical locations not easily accessible without such assistance. Exposure Time: Please see nurses notes. Contrast: Before injecting any contrast, we confirmed that the patient did not have an allergy to iodine, shellfish, or radiological contrast. Once satisfactory needle placement was completed at the desired level, radiological contrast was injected. Contrast injected under live fluoroscopy. No contrast complications. See chart for type and volume of contrast used. Fluoroscopic Guidance: I was personally  present during the use of fluoroscopy. "Tunnel Vision Technique" used to obtain the best possible view of the target area. Parallax error corrected before commencing the procedure. "Direction-depth-direction" technique used to introduce the needle under continuous pulsed fluoroscopy. Once target was reached, antero-posterior, oblique, and lateral fluoroscopic projection used confirm needle placement in all planes. Images permanently stored in EMR. Interpretation: I personally interpreted the imaging intraoperatively. Adequate needle placement confirmed in multiple planes. Appropriate spread of contrast into desired area was observed. No evidence of afferent or efferent intravascular uptake. Permanent images saved into the patient's record.  Antibiotic Prophylaxis:   Anti-infectives (From admission, onward)   None     Indication(s): None identified  Post-operative Assessment:  Post-procedure Vital Signs:  Pulse/HCG Rate: 7572 Temp: 98.4 F (36.9 C) Resp: 16 BP: (!) 139/91 SpO2: 99 %  EBL: None  Complications: No immediate post-treatment complications observed by team, or reported by patient.  Note: The patient tolerated the entire procedure well. A repeat set of vitals were taken after the procedure and the patient was kept under observation following institutional policy, for this type of procedure. Post-procedural neurological assessment was performed, showing return to baseline, prior to discharge. The patient was provided with post-procedure discharge instructions, including a section on how to identify potential problems. Should any problems arise concerning this procedure, the patient was given instructions to immediately contact us, at any time, without hesitation. In any case, we plan to contact the patient by telephone for a follow-up status report regarding this interventional procedure.  Comments:  No additional relevant information. 5 out of 5 strength bilateral lower extremity:  Plantar flexion, dorsiflexion, knee flexion, knee extension.  Plan of Care   Imaging Orders     DG C-Arm 1-60 Min-No Report Procedure Orders    No procedure(s) ordered today    Medications ordered for procedure:  Meds ordered this encounter  Medications  . lactated ringers infusion 1,000 mL  . fentaNYL (SUBLIMAZE) injection 25-100 mcg    Make sure Narcan is available in the pyxis when using this medication. In the event of respiratory depression (RR< 8/min): Titrate NARCAN (naloxone) in increments of 0.1 to 0.2 mg IV at 2-3 minute intervals, until desired degree of reversal.  . ropivacaine (PF) 2 mg/mL (0.2%) (NAROPIN) injection 10 mL  . dexamethasone (DECADRON) injection 10 mg  . dexamethasone (DECADRON) injection 10 mg   Medications administered: We administered lactated ringers, fentaNYL, ropivacaine (PF) 2 mg/mL (0.2%), dexamethasone, and dexamethasone.  See the medical record for exact dosing, route, and time of administration.  New Prescriptions   No medications on file   Disposition: Discharge home  Discharge Date & Time: 10/27/2017; 1310 hrs.   Physician-requested Follow-up: Return in about 4 weeks (around 11/24/2017) for Post Procedure Evaluation.  Future Appointments  Date Time Provider Department Center  11/25/2017 10:30 AM Edward Jolly, MD [facility] None   Primary Care Physician: Marta Antu, DO Location: [facility] Note by: Edward Jolly, MD Date: 10/27/2017; Time: 2:45 PM  Disclaimer:  Medicine is not an exact science. The only guarantee in medicine is that nothing is guaranteed. It is important to note that the decision to proceed with this intervention was based on the information collected from the patient. The Data and conclusions were drawn from the patient's questionnaire, the interview, and the physical examination. Because the information was provided in large part by the patient, it cannot be guaranteed that it has not been  purposely or unconsciously manipulated. Every effort has been made to obtain as much relevant data as possible for this evaluation. It is important to note that the conclusions that lead to this procedure are derived in large part from the available data. Always take into account that the treatment will also be dependent on availability of resources and existing treatment guidelines, considered by other Pain Management Practitioners as being common knowledge and practice, at the time of the intervention. For Medico-Legal purposes, it is also important to point out that variation in procedural techniques and pharmacological choices are the acceptable norm. The indications, contraindications, technique, and results of the above procedure should only be interpreted and judged by a Board-Certified Interventional Pain Specialist with extensive familiarity and expertise in the same exact procedure and technique.

## 2017-10-28 ENCOUNTER — Telehealth: Payer: Self-pay

## 2017-10-28 NOTE — Telephone Encounter (Signed)
No answer. Left message on am to call if needed. 

## 2017-11-25 ENCOUNTER — Ambulatory Visit: Payer: Medicaid Other | Admitting: Student in an Organized Health Care Education/Training Program

## 2017-12-15 ENCOUNTER — Ambulatory Visit: Payer: Medicaid Other | Admitting: Student in an Organized Health Care Education/Training Program

## 2018-01-05 ENCOUNTER — Encounter: Payer: Self-pay | Admitting: Student in an Organized Health Care Education/Training Program

## 2018-01-05 ENCOUNTER — Ambulatory Visit
Payer: Medicaid Other | Attending: Student in an Organized Health Care Education/Training Program | Admitting: Student in an Organized Health Care Education/Training Program

## 2018-01-05 VITALS — BP 149/101 | HR 86 | Temp 98.5°F | Resp 16 | Ht 73.5 in | Wt 290.0 lb

## 2018-01-05 DIAGNOSIS — M19011 Primary osteoarthritis, right shoulder: Secondary | ICD-10-CM | POA: Insufficient documentation

## 2018-01-05 DIAGNOSIS — E785 Hyperlipidemia, unspecified: Secondary | ICD-10-CM | POA: Diagnosis not present

## 2018-01-05 DIAGNOSIS — F329 Major depressive disorder, single episode, unspecified: Secondary | ICD-10-CM | POA: Diagnosis not present

## 2018-01-05 DIAGNOSIS — Z96611 Presence of right artificial shoulder joint: Secondary | ICD-10-CM | POA: Insufficient documentation

## 2018-01-05 DIAGNOSIS — J449 Chronic obstructive pulmonary disease, unspecified: Secondary | ICD-10-CM | POA: Insufficient documentation

## 2018-01-05 DIAGNOSIS — Z79899 Other long term (current) drug therapy: Secondary | ICD-10-CM | POA: Diagnosis not present

## 2018-01-05 DIAGNOSIS — Z6837 Body mass index (BMI) 37.0-37.9, adult: Secondary | ICD-10-CM | POA: Diagnosis not present

## 2018-01-05 DIAGNOSIS — E669 Obesity, unspecified: Secondary | ICD-10-CM | POA: Insufficient documentation

## 2018-01-05 DIAGNOSIS — M19012 Primary osteoarthritis, left shoulder: Secondary | ICD-10-CM | POA: Diagnosis not present

## 2018-01-05 DIAGNOSIS — K219 Gastro-esophageal reflux disease without esophagitis: Secondary | ICD-10-CM | POA: Diagnosis not present

## 2018-01-05 DIAGNOSIS — Z8249 Family history of ischemic heart disease and other diseases of the circulatory system: Secondary | ICD-10-CM | POA: Insufficient documentation

## 2018-01-05 DIAGNOSIS — Z8673 Personal history of transient ischemic attack (TIA), and cerebral infarction without residual deficits: Secondary | ICD-10-CM | POA: Diagnosis not present

## 2018-01-05 DIAGNOSIS — M170 Bilateral primary osteoarthritis of knee: Secondary | ICD-10-CM | POA: Insufficient documentation

## 2018-01-05 DIAGNOSIS — G894 Chronic pain syndrome: Secondary | ICD-10-CM

## 2018-01-05 DIAGNOSIS — Z7982 Long term (current) use of aspirin: Secondary | ICD-10-CM | POA: Diagnosis not present

## 2018-01-05 DIAGNOSIS — M25562 Pain in left knee: Secondary | ICD-10-CM | POA: Diagnosis not present

## 2018-01-05 DIAGNOSIS — G8929 Other chronic pain: Secondary | ICD-10-CM

## 2018-01-05 DIAGNOSIS — F17200 Nicotine dependence, unspecified, uncomplicated: Secondary | ICD-10-CM | POA: Insufficient documentation

## 2018-01-05 DIAGNOSIS — Z96612 Presence of left artificial shoulder joint: Secondary | ICD-10-CM | POA: Insufficient documentation

## 2018-01-05 DIAGNOSIS — I10 Essential (primary) hypertension: Secondary | ICD-10-CM | POA: Diagnosis not present

## 2018-01-05 DIAGNOSIS — M25561 Pain in right knee: Secondary | ICD-10-CM

## 2018-01-05 MED ORDER — TIZANIDINE HCL 4 MG PO TABS: 4.0000 mg | ORAL_TABLET | Freq: Three times a day (TID) | ORAL | 3 refills | Status: DC | PRN

## 2018-01-05 NOTE — Patient Instructions (Signed)
____________________________________________________________________________________________  Preparing for Procedure with Sedation  Instructions: . Oral Intake: Do not eat or drink anything for at least 8 hours prior to your procedure. . Transportation: Public transportation is not allowed. Bring an adult driver. The driver must be physically present in our waiting room before any procedure can be started. . Physical Assistance: Bring an adult physically capable of assisting you, in the event you need help. This adult should keep you company at home for at least 6 hours after the procedure. . Blood Pressure Medicine: Take your blood pressure medicine with a sip of water the morning of the procedure. . Blood thinners: Notify our staff if you are taking any blood thinners. Depending on which one you take, there will be specific instructions on how and when to stop it. . Diabetics on insulin: Notify the staff so that you can be scheduled 1st case in the morning. If your diabetes requires high dose insulin, take only  of your normal insulin dose the morning of the procedure and notify the staff that you have done so. . Preventing infections: Shower with an antibacterial soap the morning of your procedure. . Build-up your immune system: Take 1000 mg of Vitamin C with every meal (3 times a day) the day prior to your procedure. . Antibiotics: Inform the staff if you have a condition or reason that requires you to take antibiotics before dental procedures. . Pregnancy: If you are pregnant, call and cancel the procedure. . Sickness: If you have a cold, fever, or any active infections, call and cancel the procedure. . Arrival: You must be in the facility at least 30 minutes prior to your scheduled procedure. . Children: Do not bring children with you. . Dress appropriately: Bring dark clothing that you would not mind if they get stained. . Valuables: Do not bring any jewelry or valuables.  Procedure  appointments are reserved for interventional treatments only. . No Prescription Refills. . No medication changes will be discussed during procedure appointments. . No disability issues will be discussed.  Reasons to call and reschedule or cancel your procedure: (Following these recommendations will minimize the risk of a serious complication.) . Surgeries: Avoid having procedures within 2 weeks of any surgery. (Avoid for 2 weeks before or after any surgery). . Flu Shots: Avoid having procedures within 2 weeks of a flu shots or . (Avoid for 2 weeks before or after immunizations). . Barium: Avoid having a procedure within 7-10 days after having had a radiological study involving the use of radiological contrast. (Myelograms, Barium swallow or enema study). . Heart attacks: Avoid any elective procedures or surgeries for the initial 6 months after a "Myocardial Infarction" (Heart Attack). . Blood thinners: It is imperative that you stop these medications before procedures. Let us know if you if you take any blood thinner.  . Infection: Avoid procedures during or within two weeks of an infection (including chest colds or gastrointestinal problems). Symptoms associated with infections include: Localized redness, fever, chills, night sweats or profuse sweating, burning sensation when voiding, cough, congestion, stuffiness, runny nose, sore throat, diarrhea, nausea, vomiting, cold or Flu symptoms, recent or current infections. It is specially important if the infection is over the area that we intend to treat. . Heart and lung problems: Symptoms that may suggest an active cardiopulmonary problem include: cough, chest pain, breathing difficulties or shortness of breath, dizziness, ankle swelling, uncontrolled high or unusually low blood pressure, and/or palpitations. If you are experiencing any of these symptoms, cancel   your procedure and contact your primary care physician for an evaluation.  Remember:   Regular Business hours are:  Monday to Thursday 8:00 AM to 4:00 PM  Provider's Schedule: Francisco Naveira, MD:  Procedure days: Tuesday and Thursday 7:30 AM to 4:00 PM  Bilal Lateef, MD:  Procedure days: Monday and Wednesday 7:30 AM to 4:00 PM ____________________________________________________________________________________________  ____________________________________________________________________________________________  Genicular Nerve Block  What is a genicular nerve block? A genicular nerve block is the injection of a local anesthetic to block the nerves that transmits pain from the knee.  What is the purpose of a facet nerve block? A genicular nerve block is a diagnostic procedure to determine if the pathologic changes (i.e. arthritis, meniscal tears, etc) and inflammation within the knee joint is the source of your knee pain. It also confirms that the knee pain will respond well to the actual treatment procedure. If a genicular nerve block works, it will give you relief for several hours. After that, the pain is expected to return to normal. This test is always performed twice (usually a week or two apart) because two successful tests are required to move onto treatment. If both diagnostic tests are positive, then we schedule a treatment called radiofrequency (RF) ablation. In this procedure, the same nerves are cauterized, which typically leads to pain relief for 4 -18 months. If this process works well for one knee, it can be performed on the other knee if needed.  How is the procedure performed? You will be placed on the procedure table. The injection site is sterilized with either iodine or chlorhexadine. The site to be injected is numbed with a local anesthetic, and a needle is directed to the target area. X-ray guidance is used to ensure proper placement and positioning of the needle. When the needle is properly positioned near the genicular nerve, local anesthetic is  injected to numb that nerve. This will be repeated at multiple sites around the knee to block all genicular nerves.  Will the procedure be painful? The injection can be painful and we therefore provide the option of receiving IV sedation. IV sedation, combined with local anesthetic, can make the injection nearly pain free. It allows you to remain very still during the procedure, which can also make the injection easier, faster, and more successful. If you decide to have IV sedation, you must have a driver to get you home safely afterwards. In addition, you cannot have anything to eat or drink within 8 hours of your appointment (clear liquids are allowed until 3 hours before the procedure). If you take medications for diabetes, these medications may need to be adjusted the morning of the procedure. Your primary care physician can help you with this adjustment.  What are the discharge instructions? If you received IV sedation do not drive or operate machinery for at least 24 hours after the procedure. You may return to work the next day following your procedure. You may resume your normal diet immediately. Do not engage in any strenuous activity for 24 hours. You should, however, engage in moderate activity that typically causes your ususal pain. If the block works, those activities should not be painful for several hours after the injection. Do not take a bath, swim, or use a hot tub for 24 hours (you may take a shower). Call the office if you have any of the following: severe pain afterwards (different than your usual symptoms), redness/swelling/discharge at the injection site(s), fevers/chills, difficulty with bowel or bladder functions.  What   are the risks and side effects? The complication rate for this procedure is very low. Whenever a needle enters the skin, bleeding or infection can occur. Some other serious but extremely rare risks include paralysis and death. You may have an allergic reaction to  any of the medications used. If you have a known allergy to any medications, especially local anesthetics, notify our staff before the procedure takes place. You may experience any of the following side effects up to 4 - 6 hours after the procedure: . Leg muscle weakness or numbness may occur due to the local anesthetic affecting the nerves that control your legs (this is a temporary affect and it is not paralysis). If you have any leg weakness or numbness, walk only with assistance in order to prevent falls and injury. Your leg strength will return slowly and completely. . Dizziness may occur due to a decrease in your blood pressure. If this occurs, remain in a seated or lying position. Gradually sit up, and then stand after at least 10 minutes of sitting. . Mild headaches may occur. Drink fluids and take pain medications if needed. If the headaches persist or become severe, call the office. . Mild discomfort at the injection site can occur. This typically lasts for a few hours but can persist for a couple days. If this occurs, take anti-inflammatories or pain medications, apply ice to the area the day of the procedure. If it persists, apply moist heat in the day(s) following.  The side effects listed above can be normal. They are not dangerous and will resolve on their own. If, however, you experience any of the following, a complication may have occurred and you should either contact your doctor. If he is not readily available, then you should proceed to the closest urgent care center for evaluation: . Severe or progressive pain at the injection site(s) . Arm or leg weakness that progressively worsens or persists for longer than 8 hours . Severe or progressive redness, swelling, or discharge from the injections site(s) . Fevers, chills, nausea, or vomiting . Bowel or bladder dysfunction (i.e. inability to urinate or pass stool or difficulty controlling either)  How long does it take for the  procedure to work? You should feel relief from your usual pain within the first hour. Again, this is only expected to last for several hours, at the most. Remember, you may be sore in the middle part of your back from the needles, and you must distinguish this from your usual pain. ____________________________________________________________________________________________   

## 2018-01-05 NOTE — Progress Notes (Signed)
Patient's Name: Tommy Gaines  MRN: [id number]  Referring Provider: Parke Poisson, DO  DOB: 03-28-1965  PCP: Tommy Poisson, DO  DOS: 01/05/2018  Note by: Gillis Santa, MD  Service setting: Ambulatory outpatient  Specialty: Interventional Pain Management  Location: [facility] (AMB) Pain Management Facility    Patient type: Established   Primary Reason(s) for Visit: Encounter for post-procedure evaluation of chronic illness with mild to moderate exacerbation CC: Knee Pain (bilateral )  HPI  Tommy Gaines is a 52 y.o. year old, male patient, who comes today for a post-procedure evaluation. He has Chronic airway obstruction ([place]); Chronic pain of both knees; Chronic pain of both shoulders; Tendonitis of elbow, left; Obesity; History of replacement of both shoulder joints; GERD (gastroesophageal reflux disease); Chronic pain syndrome; Primary osteoarthritis of both knees; and Primary osteoarthritis of both shoulders on their problem list. His primarily concern today is the Knee Pain (bilateral )  Pain Assessment: Location: Left, Right Knee Radiating: denies  Onset: More than a month ago Duration: Chronic pain Quality: Discomfort, Throbbing, Constant Severity: 7 /10 (subjective, self-reported pain score)  Note: Reported level is inconsistent with clinical observations. Clinically the patient looks like a 2/10 A 2/10 is viewed as "Mild to Moderate" and described as noticeable and distracting. Impossible to hide from other people. More frequent flare-ups. Still possible to adapt and function close to normal. It can be very annoying and may have occasional stronger flare-ups. With discipline, patients may get used to it and adapt. Information on the proper use of the pain scale provided to the patient today. When using our objective Pain Scale, levels between 6 and 10/10 are said to belong in an emergency room, as it progressively worsens from a 6/10, described as severely limiting, requiring emergency care not  usually available at an outpatient pain management facility. At a 6/10 level, communication becomes difficult and requires great effort. Assistance to reach the emergency department may be required. Facial flushing and profuse sweating along with potentially dangerous increases in heart rate and blood pressure will be evident. Effect on ADL: difficulty getting up and down  Timing: Constant Modifying factors: getting off feet BP: (!) 149/101  HR: 86  Tommy Gaines comes in today for post-procedure evaluation.  Further details on both, my assessment(s), as well as the proposed treatment plan, please see below.  Post-Procedure Assessment  11/25/2017 Procedure: Bilateral genicular nerve block Pre-procedure pain score:  7/10 Post-procedure pain score: 6/10         Influential Factors: BMI: 37.74 kg/m Intra-procedural challenges: None observed.         Assessment challenges: None detected.              Reported side-effects: None.        Post-procedural adverse reactions or complications: None reported         Sedation: Please see nurses note. When no sedatives are used, the analgesic levels obtained are directly associated to the effectiveness of the local anesthetics. However, when sedation is provided, the level of analgesia obtained during the initial 1 hour following the intervention, is believed to be the result of a combination of factors. These factors may include, but are not limited to: 1. The effectiveness of the local anesthetics used. 2. The effects of the analgesic(s) and/or anxiolytic(s) used. 3. The degree of discomfort experienced by the patient at the time of the procedure. 4. The patients ability and reliability in recalling and recording the events. 5. The presence and influence of possible secondary gains  and/or psychosocial factors. Reported result: Relief experienced during the 1st hour after the procedure: 60 % (Ultra-Short Term Relief)            Interpretative  annotation: Clinically appropriate result. Analgesia during this period is likely to be Local Anesthetic and/or IV Sedative (Analgesic/Anxiolytic) related.          Effects of local anesthetic: The analgesic effects attained during this period are directly associated to the localized infiltration of local anesthetics and therefore cary significant diagnostic value as to the etiological location, or anatomical origin, of the pain. Expected duration of relief is directly dependent on the pharmacodynamics of the local anesthetic used. Long-acting (4-6 hours) anesthetics used.  Reported result: Relief during the next 4 to 6 hour after the procedure: 60 % (Short-Term Relief)            Interpretative annotation: Clinically appropriate result. Analgesia during this period is likely to be Local Anesthetic-related.          Long-term benefit: Defined as the period of time past the expected duration of local anesthetics (1 hour for short-acting and 4-6 hours for long-acting). With the possible exception of prolonged sympathetic blockade from the local anesthetics, benefits during this period are typically attributed to, or associated with, other factors such as analgesic sensory neuropraxia, antiinflammatory effects, or beneficial biochemical changes provided by agents other than the local anesthetics.  Reported result: Extended relief following procedure: 50% for 10 days(Long-Term Relief)            Interpretative annotation: Clinically possible results. Good relief. No permanent benefit expected. Inflammation plays a part in the etiology to the pain.          Current benefits: Defined as reported results that persistent at this point in time.   Analgesia: 25 %            Function: Somewhat improved ROM: Somewhat improved Interpretative annotation: Recurrence of symptoms. No permanent benefit expected. Effective diagnostic intervention.          Interpretation: Results would suggest a successful diagnostic  intervention.                  Plan:  Please see "Plan of Care" for details.                Laboratory Chemistry  Inflammation Markers (CRP: Acute Phase) (ESR: Chronic Phase) No results found for: CRP, ESRSEDRATE, LATICACIDVEN                       Rheumatology Markers No results found for: RF, ANA, LABURIC, URICUR, LYMEIGGIGMAB, LYMEABIGMQN, HLAB27                      Renal Function Markers Lab Results  Component Value Date   BUN 10 04/04/2011   CREATININE 1.13 04/04/2011   GFRAA >60 04/04/2011   GFRNONAA >60 04/04/2011                             Hepatic Function Markers No results found for: AST, ALT, ALBUMIN, ALKPHOS, HCVAB, AMYLASE, LIPASE, AMMONIA                      Electrolytes Lab Results  Component Value Date   NA 140 04/04/2011   K 4.1 04/04/2011   CL 106 04/04/2011   CALCIUM 8.7 04/04/2011  Neuropathy Markers No results found for: VITAMINB12, FOLATE, HGBA1C, HIV                      CNS Tests No results found for: COLORCSF, APPEARCSF, RBCCOUNTCSF, WBCCSF, POLYSCSF, LYMPHSCSF, EOSCSF, PROTEINCSF, GLUCCSF, JCVIRUS, CSFOLI, IGGCSF                      Bone Pathology Markers No results found for: VD25OH, [id number], QI2979GX2, [id number], 25OHVITD1, 25OHVITD2, 25OHVITD3, TESTOFREE, TESTOSTERONE                       Coagulation Parameters Lab Results  Component Value Date   PLT 236 04/04/2011                        Cardiovascular Markers Lab Results  Component Value Date   TROPONINI < 0.02 04/04/2011   HGB 14.4 04/04/2011   HCT 42.3 04/04/2011                         CA Markers No results found for: CEA, CA125, LABCA2                      Note: Lab results reviewed.  Recent Diagnostic Imaging Results  DG C-Arm 1-60 Min-No Report Fluoroscopy was utilized by the requesting physician.  No radiographic  interpretation.   Complexity Note: Imaging results reviewed. Results shared with Mr. Rufo, using Layman's terms.                          Meds   Current Outpatient Medications:  .  albuterol (PROVENTIL HFA;VENTOLIN HFA) 108 (90 Base) MCG/ACT inhaler, Inhale 2 puffs into the lungs 3 (three) times daily., Disp: , Rfl:  .  amLODipine (NORVASC) 5 MG tablet, Take 5 mg by mouth daily., Disp: , Rfl:  .  aspirin EC 81 MG tablet, Take 81 mg by mouth., Disp: , Rfl:  .  atorvastatin (LIPITOR) 10 MG tablet, Take 10 mg by mouth daily., Disp: , Rfl:  .  atorvastatin (LIPITOR) 10 MG tablet, Take 10 mg by mouth daily., Disp: , Rfl:  .  diclofenac sodium (VOLTAREN) 1 % GEL, Apply 1 application topically as needed., Disp: , Rfl:  .  FLUoxetine (PROZAC) 20 MG capsule, Take 20 mg by mouth daily., Disp: , Rfl:  .  FLUoxetine (PROZAC) 40 MG capsule, Take 40 mg by mouth daily., Disp: , Rfl:  .  furosemide (LASIX) 20 MG tablet, Take 20 mg by mouth daily., Disp: , Rfl:  .  furosemide (LASIX) 20 MG tablet, Take 20 mg by mouth as needed., Disp: , Rfl:  .  omeprazole (PRILOSEC) 20 MG capsule, Take 20 mg by mouth daily., Disp: , Rfl:  .  tiotropium (SPIRIVA) 18 MCG inhalation capsule, Place 18 mcg into inhaler and inhale daily., Disp: , Rfl:  .  tiZANidine (ZANAFLEX) 4 MG tablet, Take 1 tablet (4 mg total) by mouth every 8 (eight) hours as needed for muscle spasms., Disp: 90 tablet, Rfl: 3 .  baclofen (LIORESAL) 10 MG tablet, Take 10 mg by mouth as needed., Disp: , Rfl:  .  budesonide-formoterol (SYMBICORT) 160-4.5 MCG/ACT inhaler, Inhale 1 puff into the lungs 2 (two) times daily., Disp: , Rfl:  .  gabapentin (NEURONTIN) 300 MG capsule, Take 2 capsules (600 mg total) by mouth 3 (three) times daily., Disp:  180 capsule, Rfl: 2  ROS  Constitutional: Denies any fever or chills Gastrointestinal: No reported hemesis, hematochezia, vomiting, or acute GI distress Musculoskeletal: Denies any acute onset joint swelling, redness, loss of ROM, or weakness Neurological: No reported episodes of acute onset apraxia, aphasia, dysarthria, agnosia,  amnesia, paralysis, loss of coordination, or loss of consciousness  Allergies  Mr. Zeidman is allergic to acetaminophen and lidocaine.  [place]  Drug: Mr. Ashmead  reports that he does not use drugs. Alcohol:  reports that he does not drink alcohol. Tobacco:  reports that he has been smoking. He has quit using smokeless tobacco. Medical:  has a past medical history of Arthritis, COPD (chronic obstructive pulmonary disease) ([place]), Depression, Diverticulitis, Hyperlipidemia, Hypertension, and Stroke ([place]) (2016). Surgical: Mr. Lamartina  has a past surgical history that includes intestinal rupture (2012) and Joint replacement (Bilateral). Family: family history includes Cancer in his mother; Diabetes in his mother; Heart disease in his father.  Constitutional Exam  General appearance: Well nourished, well developed, and well hydrated. In no apparent acute distress Vitals:   01/05/18 1338  BP: (!) 149/101  Pulse: 86  Resp: 16  Temp: 98.5 F (36.9 C)  TempSrc: Oral  SpO2: 95%  Weight: 290 lb (131.5 kg)  Height: 6' 1.5" (1.867 m)   BMI Assessment: Estimated body mass index is 37.74 kg/m as calculated from the following:   Height as of this encounter: 6' 1.5" (1.867 m).   Weight as of this encounter: 290 lb (131.5 kg).  BMI interpretation table: BMI level Category Range association with higher incidence of chronic pain  <18 kg/m2 Underweight   18.5-24.9 kg/m2 Ideal body weight   25-29.9 kg/m2 Overweight Increased incidence by 20%  30-34.9 kg/m2 Obese (Class I) Increased incidence by 68%  35-39.9 kg/m2 Severe obesity (Class II) Increased incidence by 136%  >40 kg/m2 Extreme obesity (Class III) Increased incidence by 254%   Patient's current BMI Ideal Body weight  Body mass index is 37.74 kg/m. Ideal body weight: 81 kg (178 lb 10.9 oz) Adjusted ideal body weight: 101.2 kg (223 lb 3.4 oz)   BMI Readings from Last 4 Encounters:  01/05/18 37.74 kg/m  10/27/17 36.28 kg/m  10/06/17  35.79 kg/m  09/10/17 36.28 kg/m   Wt Readings from Last 4 Encounters:  01/05/18 290 lb (131.5 kg)  10/27/17 275 lb (124.7 kg)  10/06/17 275 lb (124.7 kg)  09/10/17 275 lb (124.7 kg)  Psych/Mental status: Alert, oriented x 3 (person, place, & time)       Eyes: PERLA Respiratory: No evidence of acute respiratory distress  Cervical Spine Area Exam  Skin & Axial Inspection: No masses, redness, edema, swelling, or associated skin lesions Alignment: Symmetrical Functional ROM: Unrestricted ROM      Stability: No instability detected Muscle Tone/Strength: Functionally intact. No obvious neuro-muscular anomalies detected. Sensory (Neurological): Unimpaired Palpation: No palpable anomalies              Upper Extremity (UE) Exam    Side: Right upper extremity  Side: Left upper extremity  Skin & Extremity Inspection: Skin color, temperature, and hair growth are WNL. No peripheral edema or cyanosis. No masses, redness, swelling, asymmetry, or associated skin lesions. No contractures.  Skin & Extremity Inspection: Skin color, temperature, and hair growth are WNL. No peripheral edema or cyanosis. No masses, redness, swelling, asymmetry, or associated skin lesions. No contractures.  Functional ROM: Unrestricted ROM          Functional ROM: Unrestricted ROM  Muscle Tone/Strength: Functionally intact. No obvious neuro-muscular anomalies detected.  Muscle Tone/Strength: Functionally intact. No obvious neuro-muscular anomalies detected.  Sensory (Neurological): Unimpaired          Sensory (Neurological): Unimpaired          Palpation: No palpable anomalies              Palpation: No palpable anomalies              Provocative Test(s):  Phalen's test: deferred Tinel's test: deferred Apley's scratch test (touch opposite shoulder):  Action 1 (Across chest): deferred Action 2 (Overhead): deferred Action 3 (LB reach): deferred   Provocative Test(s):  Phalen's test: deferred Tinel's test:  deferred Apley's scratch test (touch opposite shoulder):  Action 1 (Across chest): deferred Action 2 (Overhead): deferred Action 3 (LB reach): deferred    Thoracic Spine Area Exam  Skin & Axial Inspection: No masses, redness, or swelling Alignment: Symmetrical Functional ROM: Unrestricted ROM Stability: No instability detected Muscle Tone/Strength: Functionally intact. No obvious neuro-muscular anomalies detected. Sensory (Neurological): Unimpaired Muscle strength & Tone: No palpable anomalies  Lumbar Spine Area Exam  Skin & Axial Inspection: No masses, redness, or swelling Alignment: Symmetrical Functional ROM: Unrestricted ROM       Stability: No instability detected Muscle Tone/Strength: Functionally intact. No obvious neuro-muscular anomalies detected. Sensory (Neurological): Unimpaired Palpation: No palpable anomalies       Provocative Tests: Hyperextension/rotation test: deferred today       Lumbar quadrant test (Kemp's test): deferred today       Lateral bending test: deferred today       Patrick's Maneuver: deferred today                   FABER test: deferred today                   S-I anterior distraction/compression test: deferred today         S-I lateral compression test: deferred today         S-I Thigh-thrust test: deferred today         S-I Gaenslen's test: deferred today          Gait & Posture Assessment  Ambulation: Unassisted Gait: Relatively normal for age and body habitus Posture: WNL   Lower Extremity Exam    Side: Right lower extremity  Side: Left lower extremity  Stability: No instability observed          Stability: No instability observed          Skin & Extremity Inspection: Skin color, temperature, and hair growth are WNL. No peripheral edema or cyanosis. No masses, redness, swelling, asymmetry, or associated skin lesions. No contractures.  Skin & Extremity Inspection: Skin color, temperature, and hair growth are WNL. No peripheral edema or  cyanosis. No masses, redness, swelling, asymmetry, or associated skin lesions. No contractures.  Functional ROM: Decreased ROM for hip and knee joints          Functional ROM: Decreased ROM for hip and knee joints          Muscle Tone/Strength: Functionally intact. No obvious neuro-muscular anomalies detected.  Muscle Tone/Strength: Functionally intact. No obvious neuro-muscular anomalies detected.  Sensory (Neurological): Arthropathic arthralgia   Sensory (Neurological): Arthropathic arthralgia   DTR: Patellar: deferred today Achilles: deferred today Plantar: deferred today  DTR: Patellar: deferred today Achilles: deferred today Plantar: deferred today  Palpation: No palpable anomalies  Palpation: No palpable anomalies   Assessment  Primary Diagnosis &  Pertinent Problem List: The primary encounter diagnosis was Bilateral primary osteoarthritis of knee. Diagnoses of Chronic pain syndrome and Chronic pain of both knees were also pertinent to this visit.  Status Diagnosis  Responding Persistent Responding 1. Bilateral primary osteoarthritis of knee   2. Chronic pain syndrome   3. Chronic pain of both knees     General Recommendations: The pain condition that the patient suffers from is best treated with a multidisciplinary approach that involves an increase in physical activity to prevent de-conditioning and worsening of the pain cycle, as well as psychological counseling (formal and/or informal) to address the co-morbid psychological affects of pain. Treatment will often involve judicious use of pain medications and interventional procedures to decrease the pain, allowing the patient to participate in the physical activity that will ultimately produce long-lasting pain reductions. The goal of the multidisciplinary approach is to return the patient to a higher level of overall function and to restore their ability to perform activities of daily living.  52 year old male with a history of  bilateral primary osteoarthritis of left knee who follows up status post bilateral genicular nerve block #1 on 10/27/2017.  Patient endorses approximately 50% pain relief for the first 7 to 10 days after his procedure.  He states that not only was his pain improved but he was able to ambulate and move around with greater ease.  He states that the pain relief he experienced starting to wear off.  We discussed repeating bilateral genicular nerve block #2.  Risks and benefits were discussed and patient would like to proceed.  We will also refill patient Zanaflex as below.   Plan of Care  Pharmacotherapy (Medications Ordered): Meds ordered this encounter  Medications  . tiZANidine (ZANAFLEX) 4 MG tablet    Sig: Take 1 tablet (4 mg total) by mouth every 8 (eight) hours as needed for muscle spasms.    Dispense:  90 tablet    Refill:  3   Lab-work, procedure(s), and/or referral(s): Orders Placed This Encounter  Procedures  . GENICULAR NERVE BLOCK   Time Note: Greater than 50% of the 25 minute(s) of face-to-face time spent with Mr. Guzzetta, was spent in counseling/coordination of care regarding: Mr. Cockrell primary cause of pain, the treatment plan, treatment alternatives, the risks and possible complications of proposed treatment, going over the informed consent, the results, interpretation and significance of  his recent diagnostic interventional treatment(s) and realistic expectations.   Provider-requested follow-up: Return in about 2 weeks (around 01/19/2018) for Procedure.  Future Appointments  Date Time Provider [place]  01/12/2018 12:30 PM Gillis Santa, MD [facility] None    Primary Care Physician: Tommy Poisson, DO Location: [facility] Note by: Gillis Santa, M.D Date: 01/05/2018; Time: 2:54 PM  Patient Instructions  ____________________________________________________________________________________________  Preparing for Procedure with  Sedation  Instructions: . Oral Intake: Do not eat or drink anything for at least 8 hours prior to your procedure. . Transportation: Public transportation is not allowed. Bring an adult driver. The driver must be physically present in our waiting room before any procedure can be started. Marland Kitchen Physical Assistance: Bring an adult physically capable of assisting you, in the event you need help. This adult should keep you company at home for at least 6 hours after the procedure. . Blood Pressure Medicine: Take your blood pressure medicine with a sip of water the morning of the procedure. . Blood thinners: Notify our staff if you are taking any blood thinners. Depending on which one  you take, there will be specific instructions on how and when to stop it. . Diabetics on insulin: Notify the staff so that you can be scheduled 1st case in the morning. If your diabetes requires high dose insulin, take only  of your normal insulin dose the morning of the procedure and notify the staff that you have done so. . Preventing infections: Shower with an antibacterial soap the morning of your procedure. . Build-up your immune system: Take 1000 mg of Vitamin C with every meal (3 times a day) the day prior to your procedure. Marland Kitchen Antibiotics: Inform the staff if you have a condition or reason that requires you to take antibiotics before dental procedures. . Pregnancy: If you are pregnant, call and cancel the procedure. . Sickness: If you have a cold, fever, or any active infections, call and cancel the procedure. . Arrival: You must be in the facility at least 30 minutes prior to your scheduled procedure. . Children: Do not bring children with you. . Dress appropriately: Bring dark clothing that you would not mind if they get stained. . Valuables: Do not bring any jewelry or valuables.  Procedure appointments are reserved for interventional treatments only. Marland Kitchen No Prescription Refills. . No medication changes will be  discussed during procedure appointments. . No disability issues will be discussed.  Reasons to call and reschedule or cancel your procedure: (Following these recommendations will minimize the risk of a serious complication.) . Surgeries: Avoid having procedures within 2 weeks of any surgery. (Avoid for 2 weeks before or after any surgery). . Flu Shots: Avoid having procedures within 2 weeks of a flu shots or . (Avoid for 2 weeks before or after immunizations). . Barium: Avoid having a procedure within 7-10 days after having had a radiological study involving the use of radiological contrast. (Myelograms, Barium swallow or enema study). . Heart attacks: Avoid any elective procedures or surgeries for the initial 6 months after a "Myocardial Infarction" (Heart Attack). . Blood thinners: It is imperative that you stop these medications before procedures. Let us know if you if you take any blood thinner.  . Infection: Avoid procedures during or within two weeks of an infection (including chest colds or gastrointestinal problems). Symptoms associated with infections include: Localized redness, fever, chills, night sweats or profuse sweating, burning sensation when voiding, cough, congestion, stuffiness, runny nose, sore throat, diarrhea, nausea, vomiting, cold or Flu symptoms, recent or current infections. It is specially important if the infection is over the area that we intend to treat. Marland Kitchen Heart and lung problems: Symptoms that may suggest an active cardiopulmonary problem include: cough, chest pain, breathing difficulties or shortness of breath, dizziness, ankle swelling, uncontrolled high or unusually low blood pressure, and/or palpitations. If you are experiencing any of these symptoms, cancel your procedure and contact your primary care physician for an evaluation.  Remember:  Regular Business hours are:  Monday to Thursday 8:00 AM to 4:00 PM  Provider's Schedule: Milinda Pointer, MD:  Procedure  days: Tuesday and Thursday 7:30 AM to 4:00 PM  Gillis Santa, MD:  Procedure days: Monday and Wednesday 7:30 AM to 4:00 PM ____________________________________________________________________________________________  ____________________________________________________________________________________________  Genicular Nerve Block  What is a genicular nerve block? A genicular nerve block is the injection of a local anesthetic to block the nerves that transmits pain from the knee.  What is the purpose of a facet nerve block? A genicular nerve block is a diagnostic procedure to determine if the pathologic changes (i.e. arthritis, meniscal tears,  etc) and inflammation within the knee joint is the source of your knee pain. It also confirms that the knee pain will respond well to the actual treatment procedure. If a genicular nerve block works, it will give you relief for several hours. After that, the pain is expected to return to normal. This test is always performed twice (usually a week or two apart) because two successful tests are required to move onto treatment. If both diagnostic tests are positive, then we schedule a treatment called radiofrequency (RF) ablation. In this procedure, the same nerves are cauterized, which typically leads to pain relief for 4 -18 months. If this process works well for one knee, it can be performed on the other knee if needed.  How is the procedure performed? You will be placed on the procedure table. The injection site is sterilized with either iodine or chlorhexadine. The site to be injected is numbed with a local anesthetic, and a needle is directed to the target area. X-ray guidance is used to ensure proper placement and positioning of the needle. When the needle is properly positioned near the genicular nerve, local anesthetic is injected to numb that nerve. This will be repeated at multiple sites around the knee to block all genicular nerves.  Will the  procedure be painful? The injection can be painful and we therefore provide the option of receiving IV sedation. IV sedation, combined with local anesthetic, can make the injection nearly pain free. It allows you to remain very still during the procedure, which can also make the injection easier, faster, and more successful. If you decide to have IV sedation, you must have a driver to get you home safely afterwards. In addition, you cannot have anything to eat or drink within 8 hours of your appointment (clear liquids are allowed until 3 hours before the procedure). If you take medications for diabetes, these medications may need to be adjusted the morning of the procedure. Your primary care physician can help you with this adjustment.  What are the discharge instructions? If you received IV sedation do not drive or operate machinery for at least 24 hours after the procedure. You may return to work the next day following your procedure. You may resume your normal diet immediately. Do not engage in any strenuous activity for 24 hours. You should, however, engage in moderate activity that typically causes your ususal pain. If the block works, those activities should not be painful for several hours after the injection. Do not take a bath, swim, or use a hot tub for 24 hours (you may take a shower). Call the office if you have any of the following: severe pain afterwards (different than your usual symptoms), redness/swelling/discharge at the injection site(s), fevers/chills, difficulty with bowel or bladder functions.  What are the risks and side effects? The complication rate for this procedure is very low. Whenever a needle enters the skin, bleeding or infection can occur. Some other serious but extremely rare risks include paralysis and death. You may have an allergic reaction to any of the medications used. If you have a known allergy to any medications, especially local anesthetics, notify our staff before  the procedure takes place. You may experience any of the following side effects up to 4 - 6 hours after the procedure: . Leg muscle weakness or numbness may occur due to the local anesthetic affecting the nerves that control your legs (this is a temporary affect and it is not paralysis). If you have any leg  weakness or numbness, walk only with assistance in order to prevent falls and injury. Your leg strength will return slowly and completely. . Dizziness may occur due to a decrease in your blood pressure. If this occurs, remain in a seated or lying position. Gradually sit up, and then stand after at least 10 minutes of sitting. . Mild headaches may occur. Drink fluids and take pain medications if needed. If the headaches persist or become severe, call the office. . Mild discomfort at the injection site can occur. This typically lasts for a few hours but can persist for a couple days. If this occurs, take anti-inflammatories or pain medications, apply ice to the area the day of the procedure. If it persists, apply moist heat in the day(s) following.  The side effects listed above can be normal. They are not dangerous and will resolve on their own. If, however, you experience any of the following, a complication may have occurred and you should either contact your doctor. If he is not readily available, then you should proceed to the closest urgent care center for evaluation: . Severe or progressive pain at the injection site(s) . Arm or leg weakness that progressively worsens or persists for longer than 8 hours . Severe or progressive redness, swelling, or discharge from the injections site(s) . Fevers, chills, nausea, or vomiting . Bowel or bladder dysfunction (i.e. inability to urinate or pass stool or difficulty controlling either)  How long does it take for the procedure to work? You should feel relief from your usual pain within the first hour. Again, this is only expected to last for several  hours, at the most. Remember, you may be sore in the middle part of your back from the needles, and you must distinguish this from your usual pain. ____________________________________________________________________________________________

## 2018-01-05 NOTE — Progress Notes (Signed)
Safety precautions to be maintained throughout the outpatient stay will include: orient to surroundings, keep bed in low position, maintain call bell within reach at all times, provide assistance with transfer out of bed and ambulation.  

## 2018-01-12 ENCOUNTER — Other Ambulatory Visit: Payer: Self-pay

## 2018-01-12 ENCOUNTER — Ambulatory Visit (HOSPITAL_BASED_OUTPATIENT_CLINIC_OR_DEPARTMENT_OTHER): Payer: Medicaid Other | Admitting: Student in an Organized Health Care Education/Training Program

## 2018-01-12 ENCOUNTER — Encounter: Payer: Self-pay | Admitting: Student in an Organized Health Care Education/Training Program

## 2018-01-12 ENCOUNTER — Ambulatory Visit
Admission: RE | Admit: 2018-01-12 | Discharge: 2018-01-12 | Disposition: A | Discharge status: Home / Self Care | Payer: Medicaid Other | Source: Ambulatory Visit | Attending: Student in an Organized Health Care Education/Training Program | Admitting: Student in an Organized Health Care Education/Training Program

## 2018-01-12 VITALS — BP 135/75 | HR 71 | Temp 98.9°F | Resp 13 | Ht 73.5 in | Wt 296.0 lb

## 2018-01-12 DIAGNOSIS — M170 Bilateral primary osteoarthritis of knee: Secondary | ICD-10-CM | POA: Insufficient documentation

## 2018-01-12 MED ORDER — LACTATED RINGERS IV SOLN
1000.0000 mL | Freq: Once | INTRAVENOUS | Status: AC
  Administered 2018-01-12: 1000 mL via INTRAVENOUS

## 2018-01-12 MED ORDER — DEXAMETHASONE SODIUM PHOSPHATE 10 MG/ML IJ SOLN
10.0000 mg | Freq: Once | INTRAMUSCULAR | Status: AC
  Administered 2018-01-12: 10 mg
  Filled 2018-01-12: qty 1

## 2018-01-12 MED ORDER — FENTANYL CITRATE (PF) 100 MCG/2ML IJ SOLN
25.0000 ug | INTRAMUSCULAR | Status: DC | PRN
  Administered 2018-01-12: 100 ug via INTRAVENOUS
  Filled 2018-01-12: qty 2

## 2018-01-12 MED ORDER — ROPIVACAINE HCL 2 MG/ML IJ SOLN
10.0000 mL | Freq: Once | INTRAMUSCULAR | Status: AC
  Administered 2018-01-12: 1 mL
  Filled 2018-01-12: qty 10

## 2018-01-12 NOTE — Progress Notes (Signed)
Patient's Name: Tommy Gaines  MRN: [id number]  Referring Provider: Marta AntuYu, Yolanda, DO  DOB: Aug 30, 1965  PCP: Marta AntuYu, Yolanda, DO  DOS: 01/12/2018  Note by: Edward JollyBilal Fenix Ruppe, MD  Service setting: Ambulatory outpatient  Specialty: Interventional Pain Management  Patient type: Established  Location: [facility] (AMB) Pain Management Facility  Visit type: Interventional Procedure   Primary Reason for Visit: Interventional Pain Management Treatment. CC: bilateral knee pain  Procedure:          Anesthesia, Analgesia, Anxiolysis:  Type: Genicular Nerves Block (Superior-lateral, Superior-medial, and Inferior-medial Genicular Nerves) #2  CPT: 64450      Primary Purpose: Diagnostic Region: Lateral, Anterior, and Medial aspects of the knee joint, above and below the knee joint proper. Level: Superior and inferior to the knee joint. Target Area: For Genicular Nerve block(s), the targets are: the superior-lateral genicular nerve, located in the lateral distal portion of the femoral shaft as it curves to form the lateral epicondyle, in the region of the distal femoral metaphysis; the superior-medial genicular nerve, located in the medial distal portion of the femoral shaft as it curves to form the medial epicondyle; and the inferior-medial genicular nerve, located in the medial, proximal portion of the tibial shaft, as it curves to form the medial epicondyle, in the region of the proximal tibial metaphysis. Approach: Anterior, percutaneous, ipsilateral approach. Laterality: Bilateral Position: Modified Fowler's position with pillows under the targeted knee(s).  Type: Moderate (Conscious) Sedation combined with Local Anesthesia Indication(s): Analgesia and Anxiety Route: Intravenous (IV) IV Access: Secured Sedation: Meaningful verbal contact was maintained at all times during the procedure  Local Anesthetic: 0.2 % ropivacaine   Indications: 1. Bilateral primary osteoarthritis of knee    Pain Score: Pre-procedure: 7  /10 Post-procedure: 0-No pain/10  Pre-op Assessment:  Tommy Gaines is a 52 y.o. (year old), male patient, seen today for interventional treatment. He  has a past surgical history that includes intestinal rupture (2012) and Joint replacement (Bilateral). Tommy Gaines has a current medication list which includes the following prescription(s): albuterol, amlodipine, aspirin ec, atorvastatin, atorvastatin, baclofen, diclofenac sodium, fluoxetine, fluoxetine, furosemide, furosemide, omeprazole, tiotropium, tizanidine, budesonide-formoterol, and gabapentin, and the following Facility-Administered Medications: fentanyl and lactated ringers. His primarily concern today is the No chief complaint on file.  Initial Vital Signs:  Pulse/HCG Rate: 71ECG Heart Rate: 69 Temp: 98.6 F (37 C) Resp: 18 BP: (!) 158/91 SpO2: 97 %  BMI: Estimated body mass index is 38.52 kg/m as calculated from the following:   Height as of this encounter: 6' 1.5" (1.867 m).   Weight as of this encounter: 296 lb (134.3 kg).  Risk Assessment: Allergies: Reviewed. He is allergic to acetaminophen and lidocaine.  Allergy Precautions: None required Coagulopathies: Reviewed. None identified.  Blood-thinner therapy: None at this time Active Infection(s): Reviewed. None identified. Tommy Gaines is afebrile  Site Confirmation: Tommy Gaines was asked to confirm the procedure and laterality before marking the site Procedure checklist: Completed Consent: Before the procedure and under the influence of no sedative(s), amnesic(s), or anxiolytics, the patient was informed of the treatment options, risks and possible complications. To fulfill our ethical and legal obligations, as recommended by the American Medical Association's Code of Ethics, I have informed the patient of my clinical impression; the nature and purpose of the treatment or procedure; the risks, benefits, and possible complications of the intervention; the alternatives, including  doing nothing; the risk(s) and benefit(s) of the alternative treatment(s) or procedure(s); and the risk(s) and benefit(s) of doing nothing. The patient was provided  information about the general risks and possible complications associated with the procedure. These may include, but are not limited to: failure to achieve desired goals, infection, bleeding, organ or nerve damage, allergic reactions, paralysis, and death. In addition, the patient was informed of those risks and complications associated to the procedure, such as failure to decrease pain; infection; bleeding; organ or nerve damage with subsequent damage to sensory, motor, and/or autonomic systems, resulting in permanent pain, numbness, and/or weakness of one or several areas of the body; allergic reactions; (i.e.: anaphylactic reaction); and/or death. Furthermore, the patient was informed of those risks and complications associated with the medications. These include, but are not limited to: allergic reactions (i.e.: anaphylactic or anaphylactoid reaction(s)); adrenal axis suppression; blood sugar elevation that in diabetics may result in ketoacidosis or comma; water retention that in patients with history of congestive heart failure may result in shortness of breath, pulmonary edema, and decompensation with resultant heart failure; weight gain; swelling or edema; medication-induced neural toxicity; particulate matter embolism and blood vessel occlusion with resultant organ, and/or nervous system infarction; and/or aseptic necrosis of one or more joints. Finally, the patient was informed that Medicine is not an exact science; therefore, there is also the possibility of unforeseen or unpredictable risks and/or possible complications that may result in a catastrophic outcome. The patient indicated having understood very clearly. We have given the patient no guarantees and we have made no promises. Enough time was given to the patient to ask questions,  all of which were answered to the patient's satisfaction. Tommy Gaines has indicated that he wanted to continue with the procedure. Attestation: I, the ordering provider, attest that I have discussed with the patient the benefits, risks, side-effects, alternatives, likelihood of achieving goals, and potential problems during recovery for the procedure that I have provided informed consent. Date  Time: 01/12/2018 12:11 PM  Pre-Procedure Preparation:  Monitoring: As per clinic protocol. Respiration, ETCO2, SpO2, BP, heart rate and rhythm monitor placed and checked for adequate function Safety Precautions: Patient was assessed for positional comfort and pressure points before starting the procedure. Time-out: I initiated and conducted the "Time-out" before starting the procedure, as per protocol. The patient was asked to participate by confirming the accuracy of the "Time Out" information. Verification of the correct person, site, and procedure were performed and confirmed by me, the nursing staff, and the patient. "Time-out" conducted as per Joint Commission's Universal Protocol (UP.01.01.01). Time: 1335  Description of Procedure:          Area Prepped: Entire knee area, from mid-thigh to mid-shin, lateral, anterior, and medial aspects. Prepping solution: ChloraPrep (2% chlorhexidine gluconate and 70% isopropyl alcohol) Safety Precautions: Aspiration looking for blood return was conducted prior to all injections. At no point did we inject any substances, as a needle was being advanced. No attempts were made at seeking any paresthesias. Safe injection practices and needle disposal techniques used. Medications properly checked for expiration dates. SDV (single dose vial) medications used. Latex Allergy precautions taken.   Description of the Procedure: Protocol guidelines were followed. The patient was placed in position over the procedure table. The target area was identified and the area prepped in the  usual manner. Skin & deeper tissues infiltrated with local anesthetic. Appropriate amount of time allowed to pass for local anesthetics to take effect. The procedure needles were then advanced to the target area. Proper needle placement secured. Negative aspiration confirmed. Solution injected in intermittent fashion, asking for systemic symptoms every 0.5cc of injectate. The  needles were then removed and the area cleansed, making sure to leave some of the prepping solution back to take advantage of its long term bactericidal properties.  Vitals:   01/12/18 1356 01/12/18 1400 01/12/18 1410 01/12/18 1419  BP: (!) 164/89 (!) 144/72 (!) 154/75 135/75  Pulse:      Resp: 16 11 13 13   Temp:  99.4 F (37.4 C)  98.9 F (37.2 C)  TempSrc:  Temporal  Temporal  SpO2: 95% 96% 96% 96%  Weight:      Height:        Start Time: 1335 hrs. End Time: 1356 hrs. Materials:  Needle(s) Type: Regular needle Gauge: 22G Length: 3.5-in Medication(s): Please see orders for medications and dosing details. 6 cc solution made of 5 cc of 0.2% ropivacaine, 1 cc of Decadron 10 mg/cc.  2 cc injected at each level above for the left knee 6 cc solution made of 5 cc of 0.2% ropivacaine, 1 cc of Decadron 10 mg/cc.  2 cc injected at each level above for the right knee Total steroid dose equals 20 mg of Decadron Imaging Guidance (Non-Spinal):          Type of Imaging Technique: Fluoroscopy Guidance (Non-Spinal) Indication(s): Assistance in needle guidance and placement for procedures requiring needle placement in or near specific anatomical locations not easily accessible without such assistance. Exposure Time: Please see nurses notes. Contrast: Before injecting any contrast, we confirmed that the patient did not have an allergy to iodine, shellfish, or radiological contrast. Once satisfactory needle placement was completed at the desired level, radiological contrast was injected. Contrast injected under live fluoroscopy. No  contrast complications. See chart for type and volume of contrast used. Fluoroscopic Guidance: I was personally present during the use of fluoroscopy. "Tunnel Vision Technique" used to obtain the best possible view of the target area. Parallax error corrected before commencing the procedure. "Direction-depth-direction" technique used to introduce the needle under continuous pulsed fluoroscopy. Once target was reached, antero-posterior, oblique, and lateral fluoroscopic projection used confirm needle placement in all planes. Images permanently stored in EMR. Interpretation: I personally interpreted the imaging intraoperatively. Adequate needle placement confirmed in multiple planes. Appropriate spread of contrast into desired area was observed. No evidence of afferent or efferent intravascular uptake. Permanent images saved into the patient's record.  Antibiotic Prophylaxis:   Anti-infectives (From admission, onward)   None     Indication(s): None identified  Post-operative Assessment:  Post-procedure Vital Signs:  Pulse/HCG Rate: 7167 Temp: 98.9 F (37.2 C) Resp: 13 BP: 135/75 SpO2: 96 %  EBL: None  Complications: No immediate post-treatment complications observed by team, or reported by patient.  Note: The patient tolerated the entire procedure well. A repeat set of vitals were taken after the procedure and the patient was kept under observation following institutional policy, for this type of procedure. Post-procedural neurological assessment was performed, showing return to baseline, prior to discharge. The patient was provided with post-procedure discharge instructions, including a section on how to identify potential problems. Should any problems arise concerning this procedure, the patient was given instructions to immediately contact us, at any time, without hesitation. In any case, we plan to contact the patient by telephone for a follow-up status report regarding this interventional  procedure.  Comments:  No additional relevant information. 5 out of 5 strength bilateral lower extremity: Plantar flexion, dorsiflexion, knee flexion, knee extension.  Plan of Care   Imaging Orders     DG C-Arm 1-60 Min-No Report Procedure Orders  No procedure(s) ordered today    Medications ordered for procedure: Meds ordered this encounter  Medications  . lactated ringers infusion 1,000 mL  . fentaNYL (SUBLIMAZE) injection 25-100 mcg    Make sure Narcan is available in the pyxis when using this medication. In the event of respiratory depression (RR< 8/min): Titrate NARCAN (naloxone) in increments of 0.1 to 0.2 mg IV at 2-3 minute intervals, until desired degree of reversal.  . ropivacaine (PF) 2 mg/mL (0.2%) (NAROPIN) injection 10 mL  . dexamethasone (DECADRON) injection 10 mg  . dexamethasone (DECADRON) injection 10 mg   Medications administered: We administered lactated ringers, fentaNYL, ropivacaine (PF) 2 mg/mL (0.2%), dexamethasone, and dexamethasone.  See the medical record for exact dosing, route, and time of administration.  New Prescriptions   No medications on file   Disposition: Discharge home  Discharge Date & Time: 01/12/2018; 1423 hrs.   Physician-requested Follow-up: Return in about 6 weeks (around 02/23/2018) for Post Procedure Evaluation.  Future Appointments  Date Time Provider Department Center  02/24/2018 10:30 AM Edward Jolly, MD [facility] None   Primary Care Physician: Marta Antu, DO Location: [facility] Note by: Edward Jolly, MD Date: 01/12/2018; Time: 2:33 PM  Disclaimer:  Medicine is not an exact science. The only guarantee in medicine is that nothing is guaranteed. It is important to note that the decision to proceed with this intervention was based on the information collected from the patient. The Data and conclusions were drawn from the patient's questionnaire, the interview, and the physical examination.  Because the information was provided in large part by the patient, it cannot be guaranteed that it has not been purposely or unconsciously manipulated. Every effort has been made to obtain as much relevant data as possible for this evaluation. It is important to note that the conclusions that lead to this procedure are derived in large part from the available data. Always take into account that the treatment will also be dependent on availability of resources and existing treatment guidelines, considered by other Pain Management Practitioners as being common knowledge and practice, at the time of the intervention. For Medico-Legal purposes, it is also important to point out that variation in procedural techniques and pharmacological choices are the acceptable norm. The indications, contraindications, technique, and results of the above procedure should only be interpreted and judged by a Board-Certified Interventional Pain Specialist with extensive familiarity and expertise in the same exact procedure and technique.

## 2018-01-12 NOTE — Progress Notes (Signed)
Safety precautions to be maintained throughout the outpatient stay will include: orient to surroundings, keep bed in low position, maintain call bell within reach at all times, provide assistance with transfer out of bed and ambulation.  

## 2018-01-12 NOTE — Patient Instructions (Signed)

## 2018-01-13 ENCOUNTER — Telehealth: Payer: Self-pay

## 2018-01-13 NOTE — Telephone Encounter (Signed)
Spoke with man in the house. States Brynda GreathouseRandall is doing well. Instructed to call if needed.

## 2018-02-24 ENCOUNTER — Ambulatory Visit: Payer: Medicaid Other | Admitting: Student in an Organized Health Care Education/Training Program

## 2018-03-10 ENCOUNTER — Ambulatory Visit
Payer: Medicaid Other | Attending: Student in an Organized Health Care Education/Training Program | Admitting: Student in an Organized Health Care Education/Training Program

## 2018-03-10 ENCOUNTER — Other Ambulatory Visit: Payer: Self-pay

## 2018-03-10 ENCOUNTER — Encounter: Payer: Self-pay | Admitting: Student in an Organized Health Care Education/Training Program

## 2018-03-10 VITALS — BP 169/93 | HR 72 | Temp 98.5°F | Resp 18 | Ht 73.5 in | Wt 300.0 lb

## 2018-03-10 DIAGNOSIS — M25562 Pain in left knee: Secondary | ICD-10-CM | POA: Diagnosis present

## 2018-03-10 DIAGNOSIS — G894 Chronic pain syndrome: Secondary | ICD-10-CM | POA: Diagnosis present

## 2018-03-10 DIAGNOSIS — M170 Bilateral primary osteoarthritis of knee: Secondary | ICD-10-CM | POA: Diagnosis not present

## 2018-03-10 DIAGNOSIS — G8929 Other chronic pain: Secondary | ICD-10-CM | POA: Insufficient documentation

## 2018-03-10 DIAGNOSIS — M25561 Pain in right knee: Secondary | ICD-10-CM | POA: Diagnosis not present

## 2018-03-10 NOTE — Progress Notes (Signed)
Patient's Name: Tommy Gaines  MRN: [id number]  Referring Provider: Parke Poisson, DO  DOB: 03/11/1965  PCP: Parke Poisson, DO  DOS: 03/10/2018  Note by: Gillis Santa, MD  Service setting: Ambulatory outpatient  Specialty: Interventional Pain Management  Location: [facility] (AMB) Pain Management Facility    Patient type: Established   Primary Reason(s) for Visit: Encounter for post-procedure evaluation of chronic illness with mild to moderate exacerbation CC: Knee Pain (bilateral)  HPI  Tommy Gaines is a 53 y.o. year old, male patient, who comes today for a post-procedure evaluation. He has Chronic airway obstruction ([place]); Chronic pain of both knees; Chronic pain of both shoulders; Tendonitis of elbow, left; Obesity; History of replacement of both shoulder joints; GERD (gastroesophageal reflux disease); Chronic pain syndrome; Primary osteoarthritis of both knees; and Primary osteoarthritis of both shoulders on their problem list. His primarily concern today is the Knee Pain (bilateral)  Pain Assessment: Location:    Bilateral knee Radiating:  To lower calf Onset:  Greater than 3 months ago Duration:  Worse in the evening and with weightbearing Quality:  Achy, throbbing, pulsating Severity: 5 /10 (subjective, self-reported pain score)  Note: Reported level is inconsistent with clinical observations. Clinically the patient looks like a 2/10 A 2/10 is viewed as "Mild to Moderate" and described as noticeable and distracting. Impossible to hide from other people. More frequent flare-ups. Still possible to adapt and function close to normal. It can be very annoying and may have occasional stronger flare-ups. With discipline, patients may get used to it and adapt. Information on the proper use of the pain scale provided to the patient today. When using our objective Pain Scale, levels between 6 and 10/10 are said to belong in an emergency room, as it progressively worsens from a 6/10, described as severely  limiting, requiring emergency care not usually available at an outpatient pain management facility. At a 6/10 level, communication becomes difficult and requires great effort. Assistance to reach the emergency department may be required. Facial flushing and profuse sweating along with potentially dangerous increases in heart rate and blood pressure will be evident. Effect on ADL:   Timing:   Modifying factors:   BP: (!) 169/93  HR: 72  Tommy Gaines comes in today for post-procedure evaluation.  Further details on both, my assessment(s), as well as the proposed treatment plan, please see below.  Post-Procedure Assessment  01/12/2018 Procedure: Bilateral genicular nerve block #2 Pre-procedure pain score:  7/10 Post-procedure pain score: 0/10         Influential Factors: BMI: 39.04 kg/m Intra-procedural challenges: None observed.         Assessment challenges: None detected.              Reported side-effects: None.        Post-procedural adverse reactions or complications: None reported         Sedation: Please see nurses note. When no sedatives are used, the analgesic levels obtained are directly associated to the effectiveness of the local anesthetics. However, when sedation is provided, the level of analgesia obtained during the initial 1 hour following the intervention, is believed to be the result of a combination of factors. These factors may include, but are not limited to: 1. The effectiveness of the local anesthetics used. 2. The effects of the analgesic(s) and/or anxiolytic(s) used. 3. The degree of discomfort experienced by the patient at the time of the procedure. 4. The patients ability and reliability in recalling and recording the events. 5. The  presence and influence of possible secondary gains and/or psychosocial factors. Reported result: Relief experienced during the 1st hour after the procedure: 100 % (Ultra-Short Term Relief)            Interpretative annotation:  Clinically appropriate result. Analgesia during this period is likely to be Local Anesthetic and/or IV Sedative (Analgesic/Anxiolytic) related.          Effects of local anesthetic: The analgesic effects attained during this period are directly associated to the localized infiltration of local anesthetics and therefore cary significant diagnostic value as to the etiological location, or anatomical origin, of the pain. Expected duration of relief is directly dependent on the pharmacodynamics of the local anesthetic used. Long-acting (4-6 hours) anesthetics used.  Reported result: Relief during the next 4 to 6 hour after the procedure: 100 % (Short-Term Relief)            Interpretative annotation: Clinically appropriate result. Analgesia during this period is likely to be Local Anesthetic-related.          Long-term benefit: Defined as the period of time past the expected duration of local anesthetics (1 hour for short-acting and 4-6 hours for long-acting). With the possible exception of prolonged sympathetic blockade from the local anesthetics, benefits during this period are typically attributed to, or associated with, other factors such as analgesic sensory neuropraxia, antiinflammatory effects, or beneficial biochemical changes provided by agents other than the local anesthetics.  Reported result: Extended relief following procedure: 100 %("I can do a little more than before procedure, I can walk more without pain") (Long-Term Relief)            Interpretative annotation: Clinically possible results. Good relief. No permanent benefit expected. Inflammation plays a part in the etiology to the pain.          Current benefits: Defined as reported results that persistent at this point in time.   Analgesia: 50-75 %            Function: Somewhat improved ROM: Somewhat improved Interpretative annotation: Recurrence of symptoms. No permanent benefit expected. Effective diagnostic intervention.           Interpretation: Results would suggest a successful diagnostic intervention.                  Plan:  Please see "Plan of Care" for details.               PRN Genicular nerve RFA  Laboratory Chemistry  Inflammation Markers (CRP: Acute Phase) (ESR: Chronic Phase) No results found for: CRP, ESRSEDRATE, LATICACIDVEN                       Rheumatology Markers No results found for: RF, ANA, LABURIC, URICUR, LYMEIGGIGMAB, LYMEABIGMQN, HLAB27                      Renal Function Markers Lab Results  Component Value Date   BUN 10 04/04/2011   CREATININE 1.13 04/04/2011   GFRAA >60 04/04/2011   GFRNONAA >60 04/04/2011                             Hepatic Function Markers No results found for: AST, ALT, ALBUMIN, ALKPHOS, HCVAB, AMYLASE, LIPASE, AMMONIA                      Electrolytes Lab Results  Component Value Date   NA 140  04/04/2011   K 4.1 04/04/2011   CL 106 04/04/2011   CALCIUM 8.7 04/04/2011                        Neuropathy Markers No results found for: VITAMINB12, FOLATE, HGBA1C, HIV                      CNS Tests No results found for: COLORCSF, APPEARCSF, RBCCOUNTCSF, WBCCSF, POLYSCSF, LYMPHSCSF, EOSCSF, PROTEINCSF, GLUCCSF, JCVIRUS, CSFOLI, IGGCSF                      Bone Pathology Markers No results found for: VD25OH, [id number], GU5427CW2, BJ6283TD1, 25OHVITD1, 25OHVITD2, 25OHVITD3, TESTOFREE, TESTOSTERONE                       Coagulation Parameters Lab Results  Component Value Date   PLT 236 04/04/2011                        Cardiovascular Markers Lab Results  Component Value Date   TROPONINI < 0.02 04/04/2011   HGB 14.4 04/04/2011   HCT 42.3 04/04/2011                         CA Markers No results found for: CEA, CA125, LABCA2                      Note: Lab results reviewed.  Recent Diagnostic Imaging Results  DG C-Arm 1-60 Min-No Report Fluoroscopy was utilized by the requesting physician.  No radiographic  interpretation.   Complexity  Note: Imaging results reviewed. Results shared with Tommy Gaines, using Layman's terms.                         Meds   Current Outpatient Medications:  .  albuterol (PROVENTIL HFA;VENTOLIN HFA) 108 (90 Base) MCG/ACT inhaler, Inhale 2 puffs into the lungs 3 (three) times daily., Disp: , Rfl:  .  amLODipine (NORVASC) 5 MG tablet, Take 5 mg by mouth daily., Disp: , Rfl:  .  aspirin EC 81 MG tablet, Take 81 mg by mouth., Disp: , Rfl:  .  atorvastatin (LIPITOR) 10 MG tablet, Take 10 mg by mouth daily., Disp: , Rfl:  .  atorvastatin (LIPITOR) 10 MG tablet, Take 10 mg by mouth daily., Disp: , Rfl:  .  budesonide-formoterol (SYMBICORT) 160-4.5 MCG/ACT inhaler, Inhale 1 puff into the lungs 2 (two) times daily., Disp: , Rfl:  .  diclofenac sodium (VOLTAREN) 1 % GEL, Apply 1 application topically as needed., Disp: , Rfl:  .  FLUoxetine (PROZAC) 40 MG capsule, Take 40 mg by mouth daily., Disp: , Rfl:  .  furosemide (LASIX) 20 MG tablet, Take 20 mg by mouth daily as needed. , Disp: , Rfl:  .  gabapentin (NEURONTIN) 300 MG capsule, Take 2 capsules (600 mg total) by mouth 3 (three) times daily., Disp: 180 capsule, Rfl: 2 .  omeprazole (PRILOSEC) 20 MG capsule, Take 20 mg by mouth daily., Disp: , Rfl:  .  tiotropium (SPIRIVA) 18 MCG inhalation capsule, Place 18 mcg into inhaler and inhale daily., Disp: , Rfl:  .  tiZANidine (ZANAFLEX) 4 MG tablet, Take 1 tablet (4 mg total) by mouth every 8 (eight) hours as needed for muscle spasms., Disp: 90 tablet, Rfl: 3 .  baclofen (LIORESAL) 10 MG tablet,  Take 10 mg by mouth as needed., Disp: , Rfl:  .  FLUoxetine (PROZAC) 20 MG capsule, Take 20 mg by mouth daily., Disp: , Rfl:  .  furosemide (LASIX) 20 MG tablet, Take 20 mg by mouth as needed., Disp: , Rfl:   ROS  Constitutional: Denies any fever or chills Gastrointestinal: No reported hemesis, hematochezia, vomiting, or acute GI distress Musculoskeletal: Denies any acute onset joint swelling, redness, loss of ROM, or  weakness Neurological: No reported episodes of acute onset apraxia, aphasia, dysarthria, agnosia, amnesia, paralysis, loss of coordination, or loss of consciousness  Allergies  Tommy Gaines is allergic to acetaminophen and lidocaine.  [place]  Drug: Tommy Gaines  reports no history of drug use. Alcohol:  reports no history of alcohol use. Tobacco:  reports that he has been smoking. He has quit using smokeless tobacco. Medical:  has a past medical history of Arthritis, COPD (chronic obstructive pulmonary disease) ([place]), Depression, Diverticulitis, Hyperlipidemia, Hypertension, and Stroke ([place]) (2016). Surgical: Tommy Gaines  has a past surgical history that includes intestinal rupture (2012) and Joint replacement (Bilateral). Family: family history includes Cancer in his mother; Diabetes in his mother; Heart disease in his father.  Constitutional Exam  General appearance: Well nourished, well developed, and well hydrated. In no apparent acute distress Vitals:   03/10/18 1435  BP: (!) 169/93  Pulse: 72  Resp: 18  Temp: 98.5 F (36.9 C)  TempSrc: Oral  SpO2: 96%  Weight: 300 lb (136.1 kg)  Height: 6' 1.5" (1.867 m)   BMI Assessment: Estimated body mass index is 39.04 kg/m as calculated from the following:   Height as of this encounter: 6' 1.5" (1.867 m).   Weight as of this encounter: 300 lb (136.1 kg).  BMI interpretation table: BMI level Category Range association with higher incidence of chronic pain  <18 kg/m2 Underweight   18.5-24.9 kg/m2 Ideal body weight   25-29.9 kg/m2 Overweight Increased incidence by 20%  30-34.9 kg/m2 Obese (Class I) Increased incidence by 68%  35-39.9 kg/m2 Severe obesity (Class II) Increased incidence by 136%  >40 kg/m2 Extreme obesity (Class III) Increased incidence by 254%   Patient's current BMI Ideal Body weight  Body mass index is 39.04 kg/m. Ideal body weight: 81 kg (178 lb 10.9 oz) Adjusted ideal body weight: 103.1 kg (227 lb 3.4 oz)   BMI  Readings from Last 4 Encounters:  03/10/18 39.04 kg/m  01/12/18 38.52 kg/m  01/05/18 37.74 kg/m  10/27/17 36.28 kg/m   Wt Readings from Last 4 Encounters:  03/10/18 300 lb (136.1 kg)  01/12/18 296 lb (134.3 kg)  01/05/18 290 lb (131.5 kg)  10/27/17 275 lb (124.7 kg)  Psych/Mental status: Alert, oriented x 3 (person, place, & time)       Eyes: PERLA Respiratory: No evidence of acute respiratory distress   Lumbar Spine Area Exam  Skin & Axial Inspection: No masses, redness, or swelling Alignment: Symmetrical Functional ROM: Decreased ROM       Stability: No instability detected Muscle Tone/Strength: Functionally intact. No obvious neuro-muscular anomalies detected. Sensory (Neurological): Musculoskeletal pain pattern Palpation: No palpable anomalies       Provocative Tests: Hyperextension/rotation test: deferred today       Lumbar quadrant test (Kemp's test): deferred today       Lateral bending test: deferred today       Patrick's Maneuver: deferred today                   FABER* test: deferred today  S-I anterior distraction/compression test: deferred today         S-I lateral compression test: deferred today         S-I Thigh-thrust test: deferred today         S-I Gaenslen's test: deferred today         *(Flexion, ABduction and External Rotation)  Gait & Posture Assessment  Ambulation: Unassisted Gait: Relatively normal for age and body habitus Posture: WNL   Lower Extremity Exam    Side: Right lower extremity  Side: Left lower extremity  Stability: No instability observed          Stability: No instability observed          Skin & Extremity Inspection: Skin color, temperature, and hair growth are WNL. No peripheral edema or cyanosis. No masses, redness, swelling, asymmetry, or associated skin lesions. No contractures.  Skin & Extremity Inspection: Skin color, temperature, and hair growth are WNL. No peripheral edema or cyanosis. No masses, redness,  swelling, asymmetry, or associated skin lesions. No contractures.  Functional ROM: Improved after treatment                  Functional ROM: Improved after treatment                  Muscle Tone/Strength: Functionally intact. No obvious neuro-muscular anomalies detected.  Muscle Tone/Strength: Functionally intact. No obvious neuro-muscular anomalies detected.  Sensory (Neurological): Arthropathic arthralgia        Sensory (Neurological): Arthropathic arthralgia        DTR: Patellar: deferred today Achilles: deferred today Plantar: deferred today  DTR: Patellar: deferred today Achilles: deferred today Plantar: deferred today  Palpation: No palpable anomalies  Palpation: No palpable anomalies   Assessment  Primary Diagnosis & Pertinent Problem List: The primary encounter diagnosis was Bilateral primary osteoarthritis of knee. Diagnoses of Chronic pain of both knees and Chronic pain syndrome were also pertinent to this visit.  Status Diagnosis  Responding Responding Responding 1. Bilateral primary osteoarthritis of knee   2. Chronic pain of both knees   3. Chronic pain syndrome      53 year old male with a history of bilateral knee osteoarthritis who follows up status post bilateral knee genicular nerve block #2.  Patient endorses benefit in regards to his knee pain.  He has less throbbing and aching pain with weightbearing.  He states that his functional status is also improved mildly.  We discussed radiofrequency ablation of the genicular nerves.  Risks and benefits were discussed.  Patient states that he would like to discuss with his wife further and think about this.  This is very reasonable.  I will place PRN order for genicular nerve RFA.  Plan of Care  Lab-work, procedure(s), and/or referral(s): Orders Placed This Encounter  Procedures  . Radiofrequency,Genicular   Time Note: Greater than 50% of the 25 minute(s) of face-to-face time spent with Tommy Gaines, was spent in  counseling/coordination of care regarding: Tommy Gaines primary cause of pain, the treatment plan, treatment alternatives, the risks and possible complications of proposed treatment, the results, interpretation and significance of  his recent diagnostic interventional treatment(s), realistic expectations, the goals of pain management (increased in functionality) and the need to bring and keep the BMI below 30.  Provider-requested follow-up: Return if symptoms worsen or fail to improve.  No future appointments.  Primary Care Physician: Parke Poisson, DO Location: [facility] Note by: Gillis Santa, M.D Date: 03/10/2018; Time: 3:18 PM  There  are no Patient Instructions on file for this visit.

## 2018-03-10 NOTE — Progress Notes (Signed)
Safety precautions to be maintained throughout the outpatient stay will include: orient to surroundings, keep bed in low position, maintain call bell within reach at all times, provide assistance with transfer out of bed and ambulation.  

## 2019-07-21 ENCOUNTER — Ambulatory Visit: Payer: Medicaid Other | Admitting: Student in an Organized Health Care Education/Training Program

## 2019-08-02 ENCOUNTER — Ambulatory Visit (HOSPITAL_BASED_OUTPATIENT_CLINIC_OR_DEPARTMENT_OTHER): Payer: Medicaid Other | Admitting: Student in an Organized Health Care Education/Training Program

## 2019-08-02 ENCOUNTER — Other Ambulatory Visit: Payer: Self-pay

## 2019-08-02 ENCOUNTER — Ambulatory Visit
Admission: RE | Admit: 2019-08-02 | Discharge: 2019-08-02 | Disposition: A | Discharge status: Home / Self Care | Payer: Medicaid Other | Source: Ambulatory Visit | Attending: Student in an Organized Health Care Education/Training Program | Admitting: Student in an Organized Health Care Education/Training Program

## 2019-08-02 ENCOUNTER — Encounter: Payer: Self-pay | Admitting: Student in an Organized Health Care Education/Training Program

## 2019-08-02 DIAGNOSIS — M170 Bilateral primary osteoarthritis of knee: Secondary | ICD-10-CM

## 2019-08-02 MED ORDER — FENTANYL CITRATE (PF) 100 MCG/2ML IJ SOLN
25.0000 ug | INTRAMUSCULAR | Status: DC | PRN
  Administered 2019-08-02: 100 ug via INTRAVENOUS

## 2019-08-02 MED ORDER — DEXAMETHASONE SODIUM PHOSPHATE 10 MG/ML IJ SOLN
INTRAMUSCULAR | Status: AC
  Filled 2019-08-02: qty 1

## 2019-08-02 MED ORDER — ROPIVACAINE HCL 2 MG/ML IJ SOLN
INTRAMUSCULAR | Status: AC
  Filled 2019-08-02: qty 10

## 2019-08-02 MED ORDER — FENTANYL CITRATE (PF) 100 MCG/2ML IJ SOLN
INTRAMUSCULAR | Status: AC
Start: 2019-08-02 — End: ?
  Filled 2019-08-02: qty 2

## 2019-08-02 MED ORDER — LIDOCAINE HCL 2 % IJ SOLN
INTRAMUSCULAR | Status: AC
  Filled 2019-08-02: qty 20

## 2019-08-02 MED ORDER — DEXAMETHASONE SODIUM PHOSPHATE 10 MG/ML IJ SOLN
10.0000 mg | Freq: Once | INTRAMUSCULAR | Status: AC
  Administered 2019-08-02: 10 mg

## 2019-08-02 MED ORDER — LIDOCAINE HCL 2 % IJ SOLN
20.0000 mL | Freq: Once | INTRAMUSCULAR | Status: AC
  Administered 2019-08-02: 400 mg

## 2019-08-02 MED ORDER — ROPIVACAINE HCL 2 MG/ML IJ SOLN
9.0000 mL | Freq: Once | INTRAMUSCULAR | Status: AC
  Administered 2019-08-02: 10 mL via PERINEURAL

## 2019-08-02 NOTE — Progress Notes (Signed)
Safety precautions to be maintained throughout the outpatient stay will include: orient to surroundings, keep bed in low position, maintain call bell within reach at all times, provide assistance with transfer out of bed and ambulation.  

## 2019-08-02 NOTE — Patient Instructions (Signed)

## 2019-08-02 NOTE — Progress Notes (Signed)
PROVIDER NOTE: Information contained herein reflects review and annotations entered in association with encounter. Interpretation of such information and data should be left to medically-trained personnel. Information provided to patient can be located elsewhere in the medical record under "Patient Instructions". Document created using STT-dictation technology, any transcriptional errors that may result from process are unintentional.    Patient: Tommy Gaines  Service Category: Procedure  Provider: Edward Jolly, MD  DOB: [birth year]  DOS: 08/02/2019  Location: [facility]  MRN: [id number]  Setting: Ambulatory - outpatient  Referring Provider: Edward Jolly, MD  Type: Established Patient  Specialty: Interventional Pain Management  PCP: Marta Antu, DO   Primary Reason for Visit: Interventional Pain Management Treatment. CC: Knee Pain (left )  Procedure:          Anesthesia, Analgesia, Anxiolysis:  Type: Therapeutic Superolateral, Superomedial, and Inferomedial, Genicular Nerve Radiofrequency Ablation (destruction).   #1  Region: Lateral, Anterior, and Medial aspects of the knee joint, above and below the knee joint proper. Level: Superior and inferior to the knee joint. Laterality: Left  Type: Moderate (Conscious) Sedation combined with Local Anesthesia Indication(s): Analgesia and Anxiety Route: Intravenous (IV) IV Access: Secured Sedation: Meaningful verbal contact was maintained at all times during the procedure  Local Anesthetic: Lidocaine 1-2%  Position: Supine   Indications: 1. Bilateral primary osteoarthritis of knee    Tommy Gaines has been dealing with the above chronic pain for longer than three months and has either failed to respond, was unable to tolerate, or simply did not get enough benefit from other more conservative therapies including, but not limited to: 1. Over-the-counter medications 2. Anti-inflammatory medications 3. Muscle relaxants 4. Membrane  stabilizers 5. Opioids 6. Physical therapy and/or chiropractic manipulation 7. Modalities (Heat, ice, etc.) 8. Invasive techniques such as nerve blocks. Tommy Gaines has attained more than 50% relief of the pain from a series of diagnostic injections conducted in separate occasions.  Pain Score: Pre-procedure: 7 /10 Post-procedure: 5 /10  Pre-op Assessment:  Tommy Gaines is a 54 y.o. (year old), male patient, seen today for interventional treatment. He  has a past surgical history that includes intestinal rupture (2012) and Joint replacement (Bilateral). Tommy Gaines has a current medication list which includes the following prescription(s): albuterol, amlodipine, aspirin ec, atorvastatin, baclofen, fluoxetine, fluoxetine, furosemide, lisinopril, omeprazole, tiotropium, tizanidine, atorvastatin, budesonide-formoterol, diclofenac sodium, furosemide, and gabapentin, and the following Facility-Administered Medications: fentanyl. His primarily concern today is the Knee Pain (left )  Initial Vital Signs:  Pulse/HCG Rate: 83ECG Heart Rate: 76 Temp: 98.4 F (36.9 C) Resp: 16 BP: (!) 172/97 SpO2: 97 %  BMI: Estimated body mass index is 39.04 kg/m as calculated from the following:   Height as of this encounter: 6' 1.5" (1.867 m).   Weight as of this encounter: 300 lb (136.1 kg).  Risk Assessment: Allergies: Reviewed. He is allergic to acetaminophen and lidocaine.  Allergy Precautions: None required Coagulopathies: Reviewed. None identified.  Blood-thinner therapy: None at this time Active Infection(s): Reviewed. None identified. Tommy Gaines is afebrile  Site Confirmation: Tommy Gaines was asked to confirm the procedure and laterality before marking the site Procedure checklist: Completed Consent: Before the procedure and under the influence of no sedative(s), amnesic(s), or anxiolytics, the patient was informed of the treatment options, risks and possible complications. To fulfill our ethical  and legal obligations, as recommended by the American Medical Association's Code of Ethics, I have informed the patient of my clinical impression; the nature and purpose of the treatment or  procedure; the risks, benefits, and possible complications of the intervention; the alternatives, including doing nothing; the risk(s) and benefit(s) of the alternative treatment(s) or procedure(s); and the risk(s) and benefit(s) of doing nothing. The patient was provided information about the general risks and possible complications associated with the procedure. These may include, but are not limited to: failure to achieve desired goals, infection, bleeding, organ or nerve damage, allergic reactions, paralysis, and death. In addition, the patient was informed of those risks and complications associated to the procedure, such as failure to decrease pain; infection; bleeding; organ or nerve damage with subsequent damage to sensory, motor, and/or autonomic systems, resulting in permanent pain, numbness, and/or weakness of one or several areas of the body; allergic reactions; (i.e.: anaphylactic reaction); and/or death. Furthermore, the patient was informed of those risks and complications associated with the medications. These include, but are not limited to: allergic reactions (i.e.: anaphylactic or anaphylactoid reaction(s)); adrenal axis suppression; blood sugar elevation that in diabetics may result in ketoacidosis or comma; water retention that in patients with history of congestive heart failure may result in shortness of breath, pulmonary edema, and decompensation with resultant heart failure; weight gain; swelling or edema; medication-induced neural toxicity; particulate matter embolism and blood vessel occlusion with resultant organ, and/or nervous system infarction; and/or aseptic necrosis of one or more joints. Finally, the patient was informed that Medicine is not an exact science; therefore, there is also the  possibility of unforeseen or unpredictable risks and/or possible complications that may result in a catastrophic outcome. The patient indicated having understood very clearly. We have given the patient no guarantees and we have made no promises. Enough time was given to the patient to ask questions, all of which were answered to the patient's satisfaction. Tommy Gaines has indicated that he wanted to continue with the procedure. Attestation: I, the ordering provider, attest that I have discussed with the patient the benefits, risks, side-effects, alternatives, likelihood of achieving goals, and potential problems during recovery for the procedure that I have provided informed consent. Date  Time: 08/02/2019  8:59 AM  Pre-Procedure Preparation:  Monitoring: As per clinic protocol. Respiration, ETCO2, SpO2, BP, heart rate and rhythm monitor placed and checked for adequate function Safety Precautions: Patient was assessed for positional comfort and pressure points before starting the procedure. Time-out: I initiated and conducted the "Time-out" before starting the procedure, as per protocol. The patient was asked to participate by confirming the accuracy of the "Time Out" information. Verification of the correct person, site, and procedure were performed and confirmed by me, the nursing staff, and the patient. "Time-out" conducted as per Joint Commission's Universal Protocol (UP.01.01.01). Time: 0950  Description of Procedure:          Target Area: For Genicular Nerve radiofrequency ablation (destruction), the targets are: the superolateral genicular nerve, located in the lateral distal portion of the femoral shaft as it curves to form the lateral epicondyle, in the region of the distal femoral metaphysis; the superomedial genicular nerve, located in the medial distal portion of the femoral shaft as it curves to form the medial epicondyle; and the inferomedial genicular nerve, located in the medial, proximal  portion of the tibial shaft, as it curves to form the medial epicondyle, in the region of the proximal tibial metaphysis. Approach: Anterior, ipsilateral approach. Area Prepped: Entire knee area, from mid-thigh to mid-shin, lateral, anterior, and medial aspects. DuraPrep (Iodine Povacrylex [0.7% available iodine] and Isopropyl Alcohol, 74% w/w) Safety Precautions: Aspiration looking for blood return  was conducted prior to all injections. At no point did we inject any substances, as a needle was being advanced. No attempts were made at seeking any paresthesias. Safe injection practices and needle disposal techniques used. Medications properly checked for expiration dates. SDV (single dose vial) medications used. Description of the Procedure: Protocol guidelines were followed. The patient was placed in position over the procedure table. The target area was identified and the area prepped in the usual manner. The skin and muscle were infiltrated with local anesthetic. Appropriate amount of time allowed to pass for local anesthetics to take effect. Radiofrequency needles were introduced to the target area using fluoroscopic guidance. Using the NeuroTherm NT1100 Radiofrequency Generator, sensory stimulation using 50 Hz was used to locate & identify the nerve, making sure that the needle was positioned such that there was no sensory stimulation below 0.3 V or above 0.7 V. Stimulation using 2 Hz was used to evaluate the motor component. Care was taken not to lesion any nerves that demonstrated motor stimulation of the lower extremities at an output of less than 2.5 times that of the sensory threshold, or a maximum of 2.0 V. Once satisfactory placement of the needles was achieved, the numbing solution was slowly injected after negative aspiration. After waiting for at least 2 minutes, the ablation was performed at 80 degrees C for 60 seconds, using regular Radiofrequency settings. Once the procedure was completed, the  needles were then removed and the area cleansed, making sure to leave some of the prepping solution back to take advantage of its long term bactericidal properties. Intra-operative Compliance: Compliant Vitals:   08/02/19 1002 08/02/19 1007 08/02/19 1017 08/02/19 1028  BP: (!) 168/95 (!) 168/92 (!) 142/75 (!) 141/74  Pulse:      Resp: 19 18 18 18   Temp:  98 F (36.7 C)  98.1 F (36.7 C)  TempSrc:      SpO2: 95% 96% 99% 99%  Weight:      Height:        Start Time: 0950 hrs. End Time: 1006 hrs. Materials & Medications:  Needle(s) Type: Teflon-coated, curved tip, Radiofrequency needle(s) Gauge: 22G Length: 10cm Medication(s): Please see orders for medications and dosing details.   6 cc solution made of 5 cc of 0.2% ropivacaine, 1 cc of Decadron 10 mg/cc.  2 cc injected at each level above on the left after sensorimotor testing prior to lesioning. Imaging Guidance (Non-Spinal):          Type of Imaging Technique: Fluoroscopy Guidance (Non-Spinal) Indication(s): Assistance in needle guidance and placement for procedures requiring needle placement in or near specific anatomical locations not easily accessible without such assistance. Exposure Time: Please see nurses notes. Contrast: Before injecting any contrast, we confirmed that the patient did not have an allergy to iodine, shellfish, or radiological contrast. Once satisfactory needle placement was completed at the desired level, radiological contrast was injected. Contrast injected under live fluoroscopy. No contrast complications. See chart for type and volume of contrast used. Fluoroscopic Guidance: I was personally present during the use of fluoroscopy. "Tunnel Vision Technique" used to obtain the best possible view of the target area. Parallax error corrected before commencing the procedure. "Direction-depth-direction" technique used to introduce the needle under continuous pulsed fluoroscopy. Once target was reached, antero-posterior,  oblique, and lateral fluoroscopic projection used confirm needle placement in all planes. Images permanently stored in EMR. Interpretation: I personally interpreted the imaging intraoperatively. Adequate needle placement confirmed in multiple planes. Appropriate spread of contrast into desired  area was observed. No evidence of afferent or efferent intravascular uptake. Permanent images saved into the patient's record.  Antibiotic Prophylaxis:   Anti-infectives (From admission, onward)   None     Indication(s): None identified  Post-operative Assessment:  Post-procedure Vital Signs:  Pulse/HCG Rate: 8376 Temp: 98.1 F (36.7 C) Resp: 18 BP: (!) 141/74 SpO2: 99 %  EBL: None  Complications: No immediate post-treatment complications observed by team, or reported by patient.  Note: The patient tolerated the entire procedure well. A repeat set of vitals were taken after the procedure and the patient was kept under observation following institutional policy, for this type of procedure. Post-procedural neurological assessment was performed, showing return to baseline, prior to discharge. The patient was provided with post-procedure discharge instructions, including a section on how to identify potential problems. Should any problems arise concerning this procedure, the patient was given instructions to immediately contact us, at any time, without hesitation. In any case, we plan to contact the patient by telephone for a follow-up status report regarding this interventional procedure.  Comments:  No additional relevant information.  Plan of Care  Orders:  Orders Placed This Encounter  Procedures  . DG PAIN CLINIC C-ARM 1-60 MIN NO REPORT    Intraoperative interpretation by procedural physician at [facility].    Standing Status:   Standing    Number of Occurrences:   1    Order Specific Question:   Reason for exam:    Answer:   Assistance in needle guidance and placement for  procedures requiring needle placement in or near specific anatomical locations not easily accessible without such assistance.   Medications ordered for procedure: Meds ordered this encounter  Medications  . lidocaine (XYLOCAINE) 2 % (with pres) injection 400 mg  . fentaNYL (SUBLIMAZE) injection 25-50 mcg    Make sure Narcan is available in the pyxis when using this medication. In the event of respiratory depression (RR< 8/min): Titrate NARCAN (naloxone) in increments of 0.1 to 0.2 mg IV at 2-3 minute intervals, until desired degree of reversal.  . ropivacaine (PF) 2 mg/mL (0.2%) (NAROPIN) injection 9 mL  . dexamethasone (DECADRON) injection 10 mg   Medications administered: We administered lidocaine, fentaNYL, ropivacaine (PF) 2 mg/mL (0.2%), and dexamethasone.  See the medical record for exact dosing, route, and time of administration.  Follow-up plan:   Return in about 8 weeks (around 09/27/2019) for Post Procedure Evaluation, virtual.      Status post left genicular nerve RFA on 08/02/2019   Recent Visits No visits were found meeting these conditions. Showing recent visits within past 90 days and meeting all other requirements Today's Visits Date Type Provider Dept  08/02/19 Procedure visit Edward Jolly, MD [facility]  Showing today's visits and meeting all other requirements Future Appointments Date Type Provider Dept  09/27/19 Appointment Edward Jolly, MD [facility]  Showing future appointments within next 90 days and meeting all other requirements  Disposition: Discharge home  Discharge (Date  Time): 08/02/2019; 1028 hrs.   Primary Care Physician: Marta Antu, DO Location: [facility] Note by: Edward Jolly, MD Date: 08/02/2019; Time: 11:25 AM  Disclaimer:  Medicine is not an exact science. The only guarantee in medicine is that nothing is guaranteed. It is important to note that the decision to proceed with this  intervention was based on the information collected from the patient. The Data and conclusions were drawn from the patient's questionnaire, the interview, and the physical examination.  Because the information was provided in large part by the patient, it cannot be guaranteed that it has not been purposely or unconsciously manipulated. Every effort has been made to obtain as much relevant data as possible for this evaluation. It is important to note that the conclusions that lead to this procedure are derived in large part from the available data. Always take into account that the treatment will also be dependent on availability of resources and existing treatment guidelines, considered by other Pain Management Practitioners as being common knowledge and practice, at the time of the intervention. For Medico-Legal purposes, it is also important to point out that variation in procedural techniques and pharmacological choices are the acceptable norm. The indications, contraindications, technique, and results of the above procedure should only be interpreted and judged by a Board-Certified Interventional Pain Specialist with extensive familiarity and expertise in the same exact procedure and technique.

## 2019-08-03 ENCOUNTER — Telehealth: Payer: Self-pay

## 2019-08-03 NOTE — Telephone Encounter (Signed)
Post procedure phone call.   No answer.  

## 2019-09-24 ENCOUNTER — Encounter: Payer: Self-pay | Admitting: Student in an Organized Health Care Education/Training Program

## 2019-09-24 ENCOUNTER — Telehealth: Payer: Self-pay

## 2019-09-24 NOTE — Telephone Encounter (Signed)
Attempted to call for pre virtual appointment questions.  There was no answer and no answering machine to leave a message.

## 2019-09-27 ENCOUNTER — Other Ambulatory Visit: Payer: Self-pay

## 2019-09-27 ENCOUNTER — Ambulatory Visit
Payer: Medicaid Other | Attending: Student in an Organized Health Care Education/Training Program | Admitting: Student in an Organized Health Care Education/Training Program

## 2019-09-27 DIAGNOSIS — M25561 Pain in right knee: Secondary | ICD-10-CM

## 2019-09-27 DIAGNOSIS — M170 Bilateral primary osteoarthritis of knee: Secondary | ICD-10-CM

## 2019-09-27 DIAGNOSIS — G8929 Other chronic pain: Secondary | ICD-10-CM

## 2019-09-27 DIAGNOSIS — M25562 Pain in left knee: Secondary | ICD-10-CM

## 2019-09-27 NOTE — Progress Notes (Signed)
I attempted to call the patient however no response. No VM option -Dr Sahory Nordling  

## 2020-01-29 ENCOUNTER — Inpatient Hospital Stay
Admission: EM | Admit: 2020-01-29 | Discharge: 2020-01-29 | DRG: 871 | Disposition: A | Discharge status: Other Acute Inpatient Hospital | Payer: Medicaid Other | Attending: Internal Medicine | Admitting: Internal Medicine

## 2020-01-29 ENCOUNTER — Emergency Department: Payer: Medicaid Other

## 2020-01-29 ENCOUNTER — Inpatient Hospital Stay (HOSPITAL_COMMUNITY)
Admission: AD | Admit: 2020-01-29 | Discharge: 2020-02-03 | DRG: 246 | Disposition: A | Discharge status: Home / Self Care | Payer: Medicaid Other | Source: Other Acute Inpatient Hospital | Attending: Cardiology | Admitting: Cardiology

## 2020-01-29 DIAGNOSIS — R739 Hyperglycemia, unspecified: Secondary | ICD-10-CM | POA: Diagnosis present

## 2020-01-29 DIAGNOSIS — J9602 Acute respiratory failure with hypercapnia: Secondary | ICD-10-CM | POA: Diagnosis present

## 2020-01-29 DIAGNOSIS — Z8249 Family history of ischemic heart disease and other diseases of the circulatory system: Secondary | ICD-10-CM

## 2020-01-29 DIAGNOSIS — I2109 ST elevation (STEMI) myocardial infarction involving other coronary artery of anterior wall: Secondary | ICD-10-CM | POA: Diagnosis present

## 2020-01-29 DIAGNOSIS — A419 Sepsis, unspecified organism: Secondary | ICD-10-CM | POA: Diagnosis not present

## 2020-01-29 DIAGNOSIS — G4733 Obstructive sleep apnea (adult) (pediatric): Secondary | ICD-10-CM | POA: Diagnosis present

## 2020-01-29 DIAGNOSIS — J449 Chronic obstructive pulmonary disease, unspecified: Secondary | ICD-10-CM | POA: Diagnosis present

## 2020-01-29 DIAGNOSIS — G928 Other toxic encephalopathy: Secondary | ICD-10-CM | POA: Diagnosis present

## 2020-01-29 DIAGNOSIS — I255 Ischemic cardiomyopathy: Secondary | ICD-10-CM | POA: Diagnosis not present

## 2020-01-29 DIAGNOSIS — R6521 Severe sepsis with septic shock: Secondary | ICD-10-CM | POA: Diagnosis present

## 2020-01-29 DIAGNOSIS — G9340 Encephalopathy, unspecified: Secondary | ICD-10-CM | POA: Diagnosis not present

## 2020-01-29 DIAGNOSIS — Z7951 Long term (current) use of inhaled steroids: Secondary | ICD-10-CM

## 2020-01-29 DIAGNOSIS — I214 Non-ST elevation (NSTEMI) myocardial infarction: Secondary | ICD-10-CM | POA: Diagnosis not present

## 2020-01-29 DIAGNOSIS — I5023 Acute on chronic systolic (congestive) heart failure: Secondary | ICD-10-CM

## 2020-01-29 DIAGNOSIS — N179 Acute kidney failure, unspecified: Secondary | ICD-10-CM | POA: Diagnosis present

## 2020-01-29 DIAGNOSIS — F1090 Alcohol use, unspecified, uncomplicated: Secondary | ICD-10-CM

## 2020-01-29 DIAGNOSIS — I272 Pulmonary hypertension, unspecified: Secondary | ICD-10-CM | POA: Diagnosis present

## 2020-01-29 DIAGNOSIS — Z7982 Long term (current) use of aspirin: Secondary | ICD-10-CM

## 2020-01-29 DIAGNOSIS — E785 Hyperlipidemia, unspecified: Secondary | ICD-10-CM | POA: Diagnosis present

## 2020-01-29 DIAGNOSIS — I5021 Acute systolic (congestive) heart failure: Secondary | ICD-10-CM | POA: Diagnosis present

## 2020-01-29 DIAGNOSIS — Z20822 Contact with and (suspected) exposure to covid-19: Secondary | ICD-10-CM | POA: Diagnosis present

## 2020-01-29 DIAGNOSIS — I25110 Atherosclerotic heart disease of native coronary artery with unstable angina pectoris: Secondary | ICD-10-CM | POA: Diagnosis present

## 2020-01-29 DIAGNOSIS — E872 Acidosis: Secondary | ICD-10-CM | POA: Diagnosis present

## 2020-01-29 DIAGNOSIS — R570 Cardiogenic shock: Secondary | ICD-10-CM | POA: Diagnosis present

## 2020-01-29 DIAGNOSIS — J440 Chronic obstructive pulmonary disease with acute lower respiratory infection: Secondary | ICD-10-CM | POA: Diagnosis present

## 2020-01-29 DIAGNOSIS — R0603 Acute respiratory distress: Secondary | ICD-10-CM

## 2020-01-29 DIAGNOSIS — F17200 Nicotine dependence, unspecified, uncomplicated: Secondary | ICD-10-CM | POA: Diagnosis present

## 2020-01-29 DIAGNOSIS — Z79899 Other long term (current) drug therapy: Secondary | ICD-10-CM | POA: Diagnosis not present

## 2020-01-29 DIAGNOSIS — J9601 Acute respiratory failure with hypoxia: Secondary | ICD-10-CM | POA: Diagnosis present

## 2020-01-29 DIAGNOSIS — J189 Pneumonia, unspecified organism: Secondary | ICD-10-CM | POA: Diagnosis present

## 2020-01-29 DIAGNOSIS — Z4659 Encounter for fitting and adjustment of other gastrointestinal appliance and device: Secondary | ICD-10-CM

## 2020-01-29 DIAGNOSIS — Z833 Family history of diabetes mellitus: Secondary | ICD-10-CM

## 2020-01-29 DIAGNOSIS — F1290 Cannabis use, unspecified, uncomplicated: Secondary | ICD-10-CM | POA: Diagnosis not present

## 2020-01-29 DIAGNOSIS — R0602 Shortness of breath: Secondary | ICD-10-CM

## 2020-01-29 DIAGNOSIS — Z789 Other specified health status: Secondary | ICD-10-CM

## 2020-01-29 DIAGNOSIS — J441 Chronic obstructive pulmonary disease with (acute) exacerbation: Secondary | ICD-10-CM | POA: Diagnosis present

## 2020-01-29 DIAGNOSIS — Z96611 Presence of right artificial shoulder joint: Secondary | ICD-10-CM | POA: Diagnosis present

## 2020-01-29 DIAGNOSIS — I110 Hypertensive heart disease with heart failure: Secondary | ICD-10-CM | POA: Diagnosis present

## 2020-01-29 DIAGNOSIS — Z6841 Body Mass Index (BMI) 40.0 and over, adult: Secondary | ICD-10-CM

## 2020-01-29 DIAGNOSIS — Z8673 Personal history of transient ischemic attack (TIA), and cerebral infarction without residual deficits: Secondary | ICD-10-CM | POA: Diagnosis not present

## 2020-01-29 DIAGNOSIS — Z886 Allergy status to analgesic agent status: Secondary | ICD-10-CM

## 2020-01-29 DIAGNOSIS — Z96612 Presence of left artificial shoulder joint: Secondary | ICD-10-CM | POA: Diagnosis present

## 2020-01-29 DIAGNOSIS — I420 Dilated cardiomyopathy: Secondary | ICD-10-CM | POA: Diagnosis present

## 2020-01-29 DIAGNOSIS — R0902 Hypoxemia: Secondary | ICD-10-CM

## 2020-01-29 DIAGNOSIS — F1910 Other psychoactive substance abuse, uncomplicated: Secondary | ICD-10-CM | POA: Diagnosis present

## 2020-01-29 DIAGNOSIS — F1010 Alcohol abuse, uncomplicated: Secondary | ICD-10-CM | POA: Diagnosis not present

## 2020-01-29 DIAGNOSIS — J9690 Respiratory failure, unspecified, unspecified whether with hypoxia or hypercapnia: Secondary | ICD-10-CM | POA: Diagnosis present

## 2020-01-29 DIAGNOSIS — Z978 Presence of other specified devices: Secondary | ICD-10-CM | POA: Diagnosis not present

## 2020-01-29 DIAGNOSIS — Z884 Allergy status to anesthetic agent status: Secondary | ICD-10-CM

## 2020-01-29 DIAGNOSIS — J9600 Acute respiratory failure, unspecified whether with hypoxia or hypercapnia: Secondary | ICD-10-CM | POA: Diagnosis present

## 2020-01-29 DIAGNOSIS — Z955 Presence of coronary angioplasty implant and graft: Secondary | ICD-10-CM

## 2020-01-29 DIAGNOSIS — I340 Nonrheumatic mitral (valve) insufficiency: Secondary | ICD-10-CM | POA: Diagnosis not present

## 2020-01-29 DIAGNOSIS — M549 Dorsalgia, unspecified: Secondary | ICD-10-CM | POA: Diagnosis present

## 2020-01-29 DIAGNOSIS — F32A Depression, unspecified: Secondary | ICD-10-CM | POA: Diagnosis present

## 2020-01-29 DIAGNOSIS — Z7289 Other problems related to lifestyle: Secondary | ICD-10-CM | POA: Diagnosis not present

## 2020-01-29 DIAGNOSIS — K219 Gastro-esophageal reflux disease without esophagitis: Secondary | ICD-10-CM | POA: Diagnosis present

## 2020-01-29 LAB — COMPREHENSIVE METABOLIC PANEL
ALT: 60 U/L — ABNORMAL HIGH (ref 0–44)
AST: 119 U/L — ABNORMAL HIGH (ref 15–41)
Albumin: 3.5 g/dL (ref 3.5–5.0)
Alkaline Phosphatase: 86 U/L (ref 38–126)
Anion gap: 13 (ref 5–15)
BUN: 14 mg/dL (ref 6–20)
CO2: 24 mmol/L (ref 22–32)
Calcium: 8.8 mg/dL — ABNORMAL LOW (ref 8.9–10.3)
Chloride: 96 mmol/L — ABNORMAL LOW (ref 98–111)
Creatinine, Ser: 1.24 mg/dL (ref 0.61–1.24)
GFR, Estimated: >60 mL/min (ref >60)
Glucose, Bld: 270 mg/dL — ABNORMAL HIGH (ref 70–99)
Potassium: 5.1 mmol/L (ref 3.5–5.1)
Sodium: 133 mmol/L — ABNORMAL LOW (ref 135–145)
Total Bilirubin: 1 mg/dL (ref 0.3–1.2)
Total Protein: 7.6 g/dL (ref 6.5–8.1)

## 2020-01-29 LAB — BLOOD GAS, ARTERIAL
Acid-base deficit: 2.9 mmol/L — ABNORMAL HIGH (ref 0.0–2.0)
Acid-base deficit: 1.5 mmol/L (ref 0.0–2.0)
Acid-base deficit: 1.3 mmol/L (ref 0.0–2.0)
Bicarbonate: 28.6 mmol/L — ABNORMAL HIGH (ref 20.0–28.0)
Bicarbonate: 26.8 mmol/L (ref 20.0–28.0)
Bicarbonate: 27.2 mmol/L (ref 20.0–28.0)
FIO2: 0.5
FIO2: 0.5
FIO2: 0.5
MECHVT: 500 mL
MECHVT: 500 mL
MECHVT: 500 mL
Mechanical Rate: 20
O2 Saturation: 87.2 %
O2 Saturation: 98.5 %
O2 Saturation: 99.1 %
PEEP: 8 cmH2O
PEEP: 12 cmH2O
PEEP: 8 cmH2O
Patient temperature: 37
Patient temperature: 37
Patient temperature: 37
RATE: 24 resp/min
RATE: 20 resp/min
RATE: 20 resp/min
pCO2 arterial: 86 mmHg (ref 32.0–48.0)
pCO2 arterial: 61 mmHg — ABNORMAL HIGH (ref 32.0–48.0)
pCO2 arterial: 62 mmHg — ABNORMAL HIGH (ref 32.0–48.0)
pH, Arterial: 7.13 — CL (ref 7.350–7.450)
pH, Arterial: 7.25 — ABNORMAL LOW (ref 7.350–7.450)
pH, Arterial: 7.25 — ABNORMAL LOW (ref 7.350–7.450)
pO2, Arterial: 70 mmHg — ABNORMAL LOW (ref 83.0–108.0)
pO2, Arterial: 130 mmHg — ABNORMAL HIGH (ref 83.0–108.0)
pO2, Arterial: 152 mmHg — ABNORMAL HIGH (ref 83.0–108.0)

## 2020-01-29 LAB — URINALYSIS, COMPLETE (UACMP) WITH MICROSCOPIC
Bilirubin Urine: NEGATIVE
Glucose, UA: 150 mg/dL — AB
Ketones, ur: NEGATIVE mg/dL
Leukocytes,Ua: NEGATIVE
Nitrite: NEGATIVE
Protein, ur: >=300 mg/dL — AB
Specific Gravity, Urine: 1.019 (ref 1.005–1.030)
pH: 5 (ref 5.0–8.0)

## 2020-01-29 LAB — CBC WITH DIFFERENTIAL/PLATELET
Abs Immature Granulocytes: 0.13 10*3/uL — ABNORMAL HIGH (ref 0.00–0.07)
Basophils Absolute: 0.1 10*3/uL (ref 0.0–0.1)
Basophils Relative: 0 %
Eosinophils Absolute: 0.1 10*3/uL (ref 0.0–0.5)
Eosinophils Relative: 1 %
HCT: 43.3 % (ref 39.0–52.0)
Hemoglobin: 14.2 g/dL (ref 13.0–17.0)
Immature Granulocytes: 1 %
Lymphocytes Relative: 19 %
Lymphs Abs: 3 10*3/uL (ref 0.7–4.0)
MCH: 33.3 pg (ref 26.0–34.0)
MCHC: 32.8 g/dL (ref 30.0–36.0)
MCV: 101.6 fL — ABNORMAL HIGH (ref 80.0–100.0)
Monocytes Absolute: 1.2 10*3/uL — ABNORMAL HIGH (ref 0.1–1.0)
Monocytes Relative: 8 %
Neutro Abs: 11.1 10*3/uL — ABNORMAL HIGH (ref 1.7–7.7)
Neutrophils Relative %: 71 %
Platelets: 450 10*3/uL — ABNORMAL HIGH (ref 150–400)
RBC: 4.26 MIL/uL (ref 4.22–5.81)
RDW: 13.6 % (ref 11.5–15.5)
WBC: 15.6 10*3/uL — ABNORMAL HIGH (ref 4.0–10.5)
nRBC: 0 % (ref 0.0–0.2)

## 2020-01-29 LAB — URINE DRUG SCREEN, QUALITATIVE (ARMC ONLY)
Amphetamines, Ur Screen: POSITIVE — AB
Barbiturates, Ur Screen: NOT DETECTED
Benzodiazepine, Ur Scrn: NOT DETECTED
Cannabinoid 50 Ng, Ur ~~LOC~~: POSITIVE — AB
Cocaine Metabolite,Ur ~~LOC~~: NOT DETECTED
MDMA (Ecstasy)Ur Screen: NOT DETECTED
Methadone Scn, Ur: NOT DETECTED
Opiate, Ur Screen: NOT DETECTED
Phencyclidine (PCP) Ur S: NOT DETECTED
Tricyclic, Ur Screen: NOT DETECTED

## 2020-01-29 LAB — PROTIME-INR
INR: 1.1 (ref 0.8–1.2)
Prothrombin Time: 13.9 seconds (ref 11.4–15.2)

## 2020-01-29 LAB — RESP PANEL BY RT-PCR (FLU A&B, COVID) ARPGX2
Influenza A by PCR: NEGATIVE
Influenza B by PCR: NEGATIVE
SARS Coronavirus 2 by RT PCR: NEGATIVE

## 2020-01-29 LAB — LACTIC ACID, PLASMA: Lactic Acid, Venous: 1.3 mmol/L (ref 0.5–1.9)

## 2020-01-29 LAB — APTT: aPTT: 32 seconds (ref 24–36)

## 2020-01-29 LAB — MAGNESIUM
Magnesium: 2.6 mg/dL — ABNORMAL HIGH (ref 1.7–2.4)
Magnesium: 2.3 mg/dL (ref 1.7–2.4)

## 2020-01-29 LAB — BRAIN NATRIURETIC PEPTIDE: B Natriuretic Peptide: 377.3 pg/mL — ABNORMAL HIGH (ref 0.0–100.0)

## 2020-01-29 LAB — TROPONIN I (HIGH SENSITIVITY)
Troponin I (High Sensitivity): 3145 ng/L (ref <18)
Troponin I (High Sensitivity): 11188 ng/L (ref <18)
Troponin I (High Sensitivity): 5177 ng/L (ref <18)
Troponin I (High Sensitivity): 6819 ng/L (ref <18)
Troponin I (High Sensitivity): 13150 ng/L (ref <18)

## 2020-01-29 LAB — HEPARIN LEVEL (UNFRACTIONATED): Heparin Unfractionated: 0.14 IU/mL — ABNORMAL LOW (ref 0.30–0.70)

## 2020-01-29 LAB — GLUCOSE, CAPILLARY: Glucose-Capillary: 165 mg/dL — ABNORMAL HIGH (ref 70–99)

## 2020-01-29 MED ORDER — ALBUTEROL SULFATE (2.5 MG/3ML) 0.083% IN NEBU
10.0000 mg | INHALATION_SOLUTION | Freq: Once | RESPIRATORY_TRACT | Status: AC
  Administered 2020-01-29: 13:00:00 10 mg via RESPIRATORY_TRACT
  Filled 2020-01-29: qty 12

## 2020-01-29 MED ORDER — LACTATED RINGERS IV SOLN: INTRAVENOUS | Status: DC

## 2020-01-29 MED ORDER — ROCURONIUM BROMIDE 50 MG/5ML IV SOLN
100.0000 mg | Freq: Once | INTRAVENOUS | Status: DC
  Filled 2020-01-29: qty 10

## 2020-01-29 MED ORDER — VECURONIUM BROMIDE 10 MG IV SOLR
10.0000 mg | Freq: Once | INTRAVENOUS | Status: AC
  Administered 2020-01-29: 14:00:00 10 mg via INTRAVENOUS

## 2020-01-29 MED ORDER — ORAL CARE MOUTH RINSE
15.0000 mL | OROMUCOSAL | Status: DC
  Administered 2020-01-30 – 2020-01-31 (×11): 15 mL via OROMUCOSAL

## 2020-01-29 MED ORDER — DOCUSATE SODIUM 100 MG PO CAPS: 100.0000 mg | ORAL_CAPSULE | Freq: Two times a day (BID) | ORAL | Status: DC | PRN

## 2020-01-29 MED ORDER — VANCOMYCIN HCL 1500 MG/300ML IV SOLN
1500.0000 mg | Freq: Once | INTRAVENOUS | Status: AC
  Administered 2020-01-29: 17:00:00 1500 mg via INTRAVENOUS
  Filled 2020-01-29: qty 300

## 2020-01-29 MED ORDER — CHLORHEXIDINE GLUCONATE CLOTH 2 % EX PADS
6.0000 | MEDICATED_PAD | Freq: Every day | CUTANEOUS | Status: DC
  Administered 2020-01-29 – 2020-01-30 (×2): 6 via TOPICAL

## 2020-01-29 MED ORDER — SODIUM CHLORIDE 0.9% FLUSH: 3.0000 mL | INTRAVENOUS | Status: DC | PRN

## 2020-01-29 MED ORDER — FUROSEMIDE 10 MG/ML IJ SOLN
80.0000 mg | Freq: Once | INTRAMUSCULAR | Status: AC
  Administered 2020-01-29: 14:00:00 80 mg via INTRAVENOUS
  Filled 2020-01-29: qty 8

## 2020-01-29 MED ORDER — ALBUTEROL SULFATE (2.5 MG/3ML) 0.083% IN NEBU
10.0000 mg | INHALATION_SOLUTION | Freq: Once | RESPIRATORY_TRACT | Status: AC
  Administered 2020-01-29: 11:00:00 10 mg via RESPIRATORY_TRACT
  Filled 2020-01-29: qty 12

## 2020-01-29 MED ORDER — SODIUM CHLORIDE 0.9 % IV SOLN: 250.0000 mL | INTRAVENOUS | Status: DC | PRN

## 2020-01-29 MED ORDER — FENTANYL 2500MCG IN NS 250ML (10MCG/ML) PREMIX INFUSION
50.0000 ug/h | INTRAVENOUS | Status: DC
  Administered 2020-01-30: 16:00:00 50 ug/h via INTRAVENOUS
  Administered 2020-01-30: 13:00:00 175 ug/h via INTRAVENOUS
  Administered 2020-01-30 – 2020-01-31 (×2): 200 ug/h via INTRAVENOUS
  Filled 2020-01-29 (×3): qty 250

## 2020-01-29 MED ORDER — HEPARIN (PORCINE) 25000 UT/250ML-% IV SOLN
1400.0000 [IU]/h | INTRAVENOUS | Status: DC
  Administered 2020-01-29: 15:00:00 1400 [IU]/h via INTRAVENOUS
  Filled 2020-01-29: qty 250

## 2020-01-29 MED ORDER — HEPARIN SODIUM (PORCINE) 5000 UNIT/ML IJ SOLN: 4000.0000 [IU] | Freq: Once | INTRAMUSCULAR | Status: DC

## 2020-01-29 MED ORDER — PROPOFOL 1000 MG/100ML IV EMUL
0.0000 ug/kg/min | INTRAVENOUS | Status: DC
  Administered 2020-01-30: 13:00:00 40 ug/kg/min via INTRAVENOUS
  Administered 2020-01-30: 17:00:00 20 ug/kg/min via INTRAVENOUS
  Administered 2020-01-30 (×2): 50 ug/kg/min via INTRAVENOUS
  Administered 2020-01-30 (×4): 20 ug/kg/min via INTRAVENOUS
  Administered 2020-01-30: 02:00:00 50 ug/kg/min via INTRAVENOUS
  Administered 2020-01-30: 16:00:00 20 ug/kg/min via INTRAVENOUS
  Administered 2020-01-31: 02:00:00 50 ug/kg/min via INTRAVENOUS
  Administered 2020-01-31: 05:00:00 40 ug/kg/min via INTRAVENOUS
  Filled 2020-01-29 (×12): qty 100

## 2020-01-29 MED ORDER — ETOMIDATE 2 MG/ML IV SOLN
INTRAVENOUS | Status: AC | PRN
  Administered 2020-01-29: 20 mg via INTRAVENOUS

## 2020-01-29 MED ORDER — IPRATROPIUM BROMIDE 0.02 % IN SOLN
0.5000 mg | Freq: Once | RESPIRATORY_TRACT | Status: AC
  Administered 2020-01-29: 11:00:00 0.5 mg via RESPIRATORY_TRACT
  Filled 2020-01-29: qty 2.5

## 2020-01-29 MED ORDER — ACETAMINOPHEN 650 MG RE SUPP
650.0000 mg | Freq: Once | RECTAL | Status: AC
  Administered 2020-01-29: 12:00:00 650 mg via RECTAL
  Filled 2020-01-29: qty 1

## 2020-01-29 MED ORDER — POLYETHYLENE GLYCOL 3350 17 G PO PACK: 17.0000 g | PACK | Freq: Every day | ORAL | Status: DC | PRN

## 2020-01-29 MED ORDER — SODIUM CHLORIDE 0.9 % IV BOLUS (SEPSIS)
1000.0000 mL | Freq: Once | INTRAVENOUS | Status: AC
  Administered 2020-01-29: 11:00:00 1000 mL via INTRAVENOUS

## 2020-01-29 MED ORDER — NOREPINEPHRINE 4 MG/250ML-% IV SOLN
0.0000 ug/min | INTRAVENOUS | Status: DC
  Administered 2020-01-29: 16:00:00 2 ug/min via INTRAVENOUS

## 2020-01-29 MED ORDER — ONDANSETRON HCL 4 MG/2ML IJ SOLN: 4.0000 mg | Freq: Four times a day (QID) | INTRAMUSCULAR | Status: DC | PRN

## 2020-01-29 MED ORDER — SODIUM CHLORIDE 0.9 % IV SOLN
2.0000 g | Freq: Three times a day (TID) | INTRAVENOUS | Status: DC
  Administered 2020-01-30 (×2): 2 g via INTRAVENOUS
  Filled 2020-01-29 (×2): qty 2

## 2020-01-29 MED ORDER — VANCOMYCIN HCL IN DEXTROSE 1-5 GM/200ML-% IV SOLN
1000.0000 mg | Freq: Once | INTRAVENOUS | Status: AC
  Administered 2020-01-29: 13:00:00 1000 mg via INTRAVENOUS
  Filled 2020-01-29: qty 200

## 2020-01-29 MED ORDER — ROCURONIUM BROMIDE 50 MG/5ML IV SOLN
100.0000 mg | Freq: Once | INTRAVENOUS | Status: AC
  Administered 2020-01-29: 20:00:00 100 mg via INTRAVENOUS
  Filled 2020-01-29: qty 10

## 2020-01-29 MED ORDER — IPRATROPIUM-ALBUTEROL 0.5-2.5 (3) MG/3ML IN SOLN
3.0000 mL | RESPIRATORY_TRACT | Status: DC
  Administered 2020-01-29 (×2): 3 mL via RESPIRATORY_TRACT
  Filled 2020-01-29 (×2): qty 3

## 2020-01-29 MED ORDER — FENTANYL 2500MCG IN NS 250ML (10MCG/ML) PREMIX INFUSION
0.0000 ug/h | INTRAVENOUS | Status: DC
  Administered 2020-01-29: 12:00:00 25 ug/h via INTRAVENOUS
  Filled 2020-01-29: qty 250

## 2020-01-29 MED ORDER — SODIUM CHLORIDE 0.9 % IV SOLN: 1000.0000 mL | INTRAVENOUS | Status: DC

## 2020-01-29 MED ORDER — SODIUM CHLORIDE 0.9 % IV SOLN
2.0000 g | Freq: Once | INTRAVENOUS | Status: AC
  Administered 2020-01-29: 12:00:00 2 g via INTRAVENOUS
  Filled 2020-01-29: qty 2

## 2020-01-29 MED ORDER — PANTOPRAZOLE SODIUM 40 MG IV SOLR
40.0000 mg | Freq: Every day | INTRAVENOUS | Status: DC
  Administered 2020-01-30 (×2): 40 mg via INTRAVENOUS
  Filled 2020-01-29 (×2): qty 40

## 2020-01-29 MED ORDER — ACETAMINOPHEN 325 MG PO TABS: 650.0000 mg | ORAL_TABLET | ORAL | Status: DC | PRN

## 2020-01-29 MED ORDER — FENTANYL CITRATE (PF) 100 MCG/2ML IJ SOLN
100.0000 ug | Freq: Once | INTRAMUSCULAR | Status: AC
  Administered 2020-01-29: 13:00:00 100 ug via INTRAVENOUS

## 2020-01-29 MED ORDER — HEPARIN BOLUS VIA INFUSION
4000.0000 [IU] | Freq: Once | INTRAVENOUS | Status: AC
  Administered 2020-01-29: 15:00:00 4000 [IU] via INTRAVENOUS
  Filled 2020-01-29: qty 4000

## 2020-01-29 MED ORDER — POLYETHYLENE GLYCOL 3350 17 G PO PACK
17.0000 g | PACK | Freq: Every day | ORAL | Status: DC
  Administered 2020-01-30: 10:00:00 17 g
  Filled 2020-01-29: qty 1

## 2020-01-29 MED ORDER — ASPIRIN EC 325 MG PO TBEC
325.0000 mg | DELAYED_RELEASE_TABLET | Freq: Every day | ORAL | Status: DC
  Filled 2020-01-29: qty 1

## 2020-01-29 MED ORDER — VECURONIUM BROMIDE 10 MG IV SOLR
10.0000 mg | Freq: Once | INTRAVENOUS | Status: AC
  Administered 2020-01-29: 21:00:00 10 mg via INTRAVENOUS

## 2020-01-29 MED ORDER — FENTANYL CITRATE (PF) 100 MCG/2ML IJ SOLN
100.0000 ug | Freq: Once | INTRAMUSCULAR | Status: AC
  Administered 2020-01-29: 12:00:00 100 ug via INTRAVENOUS

## 2020-01-29 MED ORDER — FAMOTIDINE IN NACL 20-0.9 MG/50ML-% IV SOLN: 20.0000 mg | Freq: Two times a day (BID) | INTRAVENOUS | Status: DC

## 2020-01-29 MED ORDER — DOCUSATE SODIUM 50 MG/5ML PO LIQD
100.0000 mg | Freq: Two times a day (BID) | ORAL | Status: DC
  Filled 2020-01-29 (×2): qty 10

## 2020-01-29 MED ORDER — ROCURONIUM BROMIDE 50 MG/5ML IV SOLN
INTRAVENOUS | Status: AC | PRN
  Administered 2020-01-29: 100 mg via INTRAVENOUS

## 2020-01-29 MED ORDER — FENTANYL BOLUS VIA INFUSION
50.0000 ug | INTRAVENOUS | Status: DC | PRN
  Filled 2020-01-29: qty 50

## 2020-01-29 MED ORDER — SODIUM CHLORIDE 0.9 % IV BOLUS (SEPSIS): 1000.0000 mL | Freq: Once | INTRAVENOUS | Status: DC

## 2020-01-29 MED ORDER — DOCUSATE SODIUM 50 MG/5ML PO LIQD
100.0000 mg | Freq: Two times a day (BID) | ORAL | Status: DC
  Administered 2020-01-30 (×3): 100 mg
  Filled 2020-01-29 (×3): qty 10

## 2020-01-29 MED ORDER — PROPOFOL 1000 MG/100ML IV EMUL
5.0000 ug/kg/min | INTRAVENOUS | Status: DC
  Administered 2020-01-29 (×2): 50 ug/kg/min via INTRAVENOUS
  Filled 2020-01-29 (×6): qty 100

## 2020-01-29 MED ORDER — METHYLPREDNISOLONE SODIUM SUCC 40 MG IJ SOLR
40.0000 mg | Freq: Two times a day (BID) | INTRAMUSCULAR | Status: DC
  Administered 2020-01-29: 17:00:00 40 mg via INTRAVENOUS
  Filled 2020-01-29: qty 1

## 2020-01-29 MED ORDER — ALBUTEROL SULFATE (2.5 MG/3ML) 0.083% IN NEBU
2.5000 mg | INHALATION_SOLUTION | RESPIRATORY_TRACT | Status: DC
  Administered 2020-01-30 – 2020-01-31 (×9): 2.5 mg via RESPIRATORY_TRACT
  Filled 2020-01-29 (×9): qty 3

## 2020-01-29 MED ORDER — VANCOMYCIN HCL 1500 MG/300ML IV SOLN
1500.0000 mg | Freq: Two times a day (BID) | INTRAVENOUS | Status: DC
  Administered 2020-01-30: 04:00:00 1500 mg via INTRAVENOUS
  Filled 2020-01-29: qty 300

## 2020-01-29 MED ORDER — BUDESONIDE 0.5 MG/2ML IN SUSP
0.5000 mg | Freq: Two times a day (BID) | RESPIRATORY_TRACT | Status: DC
  Administered 2020-01-29: 21:00:00 0.5 mg via RESPIRATORY_TRACT
  Filled 2020-01-29: qty 2

## 2020-01-29 MED ORDER — SODIUM CHLORIDE 0.9 % IV BOLUS (SEPSIS)
1000.0000 mL | Freq: Once | INTRAVENOUS | Status: AC
  Administered 2020-01-29: 1000 mL via INTRAVENOUS

## 2020-01-29 MED ORDER — PROPOFOL 1000 MG/100ML IV EMUL: 5.0000 ug/kg/min | INTRAVENOUS | Status: DC

## 2020-01-29 MED ORDER — CHLORHEXIDINE GLUCONATE 0.12% ORAL RINSE (MEDLINE KIT)
15.0000 mL | Freq: Two times a day (BID) | OROMUCOSAL | Status: DC
  Administered 2020-01-30 – 2020-01-31 (×5): 15 mL via OROMUCOSAL

## 2020-01-29 MED ORDER — SODIUM CHLORIDE 0.9% FLUSH: 3.0000 mL | Freq: Two times a day (BID) | INTRAVENOUS | Status: DC

## 2020-01-29 MED ORDER — PROPOFOL 1000 MG/100ML IV EMUL
INTRAVENOUS | Status: AC
  Administered 2020-01-29: 11:00:00 20 ug/kg/min via INTRAVENOUS
  Filled 2020-01-29: qty 100

## 2020-01-29 MED ORDER — ENOXAPARIN SODIUM 40 MG/0.4ML ~~LOC~~ SOLN: 40.0000 mg | Freq: Every day | SUBCUTANEOUS | Status: DC

## 2020-01-29 MED ORDER — ASPIRIN 300 MG RE SUPP
300.0000 mg | Freq: Once | RECTAL | Status: AC
  Administered 2020-01-29: 15:00:00 300 mg via RECTAL
  Filled 2020-01-29: qty 1

## 2020-01-29 MED ORDER — LORAZEPAM 2 MG/ML IJ SOLN: 2.0000 mg | INTRAMUSCULAR | Status: DC | PRN

## 2020-01-29 MED ORDER — POLYETHYLENE GLYCOL 3350 17 G PO PACK
17.0000 g | PACK | Freq: Every day | ORAL | Status: DC
  Administered 2020-01-29: 20:00:00 17 g
  Filled 2020-01-29: qty 1

## 2020-01-29 MED ORDER — FENTANYL CITRATE (PF) 100 MCG/2ML IJ SOLN: 50.0000 ug | Freq: Once | INTRAMUSCULAR | Status: DC

## 2020-01-29 NOTE — Progress Notes (Signed)
ANTICOAGULATION CONSULT NOTE - Initial Consult  Pharmacy Consult for heparin Indication: chest pain/ACS  Allergies  Allergen Reactions  . Acetaminophen Nausea And Vomiting  . Lidocaine Rash    Patient Measurements: Height: 6\' 1"  (185.4 cm) Weight: (!) 142 kg (313 lb 0.9 oz) IBW/kg (Calculated) : 79.9  Height 6' 1.5" from Care Everywhere (pt unable to provide) Heparin Dosing Weight: 114.5 kg  Vital Signs: Temp: 98.6 F (37 C) (12/11 2124) Temp Source: Axillary (12/11 2124) BP: 102/64 (12/11 2124) Pulse Rate: 89 (12/11 2124)  Labs: Recent Labs    01/29/20 1041 01/29/20 1216 01/29/20 2021 01/29/20 2115  HGB 14.2  --   --   --   HCT 43.3  --   --   --   PLT 450*  --   --   --   APTT 32  --   --   --   LABPROT 13.9  --   --   --   INR 1.1  --   --   --   HEPARINUNFRC  --   --   --  0.14*  CREATININE 1.24  --   --   --   TROPONINIHS 3,145* 11,188*  6,819*  5,177* 13,150*  --     Estimated Creatinine Clearance: 98.5 mL/min (by C-G formula based on SCr of 1.24 mg/dL).   Medical History: Past Medical History:  Diagnosis Date  . Arthritis   . COPD (chronic obstructive pulmonary disease) (HCC)   . Depression   . Diverticulitis   . Hyperlipidemia   . Hypertension   . Stroke [facility]) 2016   confirmed by CT scan     Assessment: 54 yo male here with respiratory distress starting on heparin drip; troponin 3145. No oral anticoagulants noted on PTA med list.   Goal of Therapy:  Heparin level 0.3-0.7 units/ml Monitor platelets by anticoagulation protocol: Yes   Plan:  This pharmacist called the ED nurse about the heparin level on 0.14 @ 2140, to see if heparin was interrupted, but was informed the patient is in the process of transferring to [facility]  Adream Parzych R Antwan Pandya 01/29/2020,9:59 PM

## 2020-01-29 NOTE — Progress Notes (Signed)
Error- see other notation.

## 2020-01-29 NOTE — Progress Notes (Signed)
Elink monitoring for code sepsis 

## 2020-01-29 NOTE — Progress Notes (Signed)
Error, see other notation

## 2020-01-29 NOTE — ED Notes (Signed)
Lab notified of need for venipuncture assist.  

## 2020-01-29 NOTE — ED Notes (Signed)
EMTALA reviewed by this RN.  

## 2020-01-29 NOTE — ED Notes (Signed)
Attempted to obtain consent for transfer from wife via phone. No answer at this time.

## 2020-01-29 NOTE — Progress Notes (Addendum)
PHARMACY -  BRIEF ANTIBIOTIC NOTE   Pharmacy has received consult(s) for cefepime and vancomycin from an ED provider.  The patient's profile has been reviewed for ht/wt/allergies/indication/available labs.    One time order(s) placed for cefepime 2g IV x1 and vancomycin 1 g IV x1 already placed by EDP. Weight not available at this time.   Further antibiotics/pharmacy consults should be ordered by admitting physician if indicated.                       Thank you, Marty Heck 01/29/2020  11:28 AM   Addendum: Weight 142 kg Will order another vancomycin 1500 mg IV x1 to complete total loading dose of 2500 mg  Crist Fat, PharmD, BCPS Clinical Pharmacist 01/29/2020 12:25 PM

## 2020-01-29 NOTE — ED Provider Notes (Signed)
[facility] Emergency Department Provider Note ____________________________________________   Event Date/Time   First MD Initiated Contact with Patient 01/29/20 1053     (approximate)  I have reviewed the triage vital signs and the nursing notes.  HISTORY  Chief Complaint Respiratory Distress   HPI Tommy Gaines is a 54 y.o. malewho presents to the ED for evaluation of respiratory distress.  Chart review indicates history of HTN, HLD, obesity, COPD, CHF and stroke.  This information was not available at the time of presentation due to acuity of condition.  Patient presents to the ED in extremis on EMS CPAP.  Patient is unable to provide any history due to acuity of condition his respiratory status.   Past Medical History:  Diagnosis Date  . Arthritis   . COPD (chronic obstructive pulmonary disease) (HCC)   . Depression   . Diverticulitis   . Hyperlipidemia   . Hypertension   . Stroke [facility](HCC) 2016   confirmed by CT scan     Patient Active Problem List   Diagnosis Date Noted  . Chronic pain syndrome 09/10/2017  . Primary osteoarthritis of both knees 09/10/2017  . Primary osteoarthritis of both shoulders 09/10/2017  . GERD (gastroesophageal reflux disease) 10/17/2016  . Chronic pain of both knees 10/03/2015  . Chronic pain of both shoulders 10/03/2015  . History of replacement of both shoulder joints 10/03/2015  . Tendonitis of elbow, left 08/13/2012  . Obesity 08/15/2011  . Chronic airway obstruction (HCC) 07/30/2011    Past Surgical History:  Procedure Laterality Date  . intestinal rupture  2012   colostomy with take down as a result of this surgery.  colostomy reversed 6 months post surgery.  Marland Kitchen. JOINT REPLACEMENT Bilateral    shoulders    Prior to Admission medications   Medication Sig Start Date End Date Taking? Authorizing Provider  albuterol (PROVENTIL HFA;VENTOLIN HFA) 108 (90 Base) MCG/ACT inhaler Inhale 2 puffs into the lungs 3  (three) times daily. 09/18/15   [provider]  amLODipine (NORVASC) 5 MG tablet Take 5 mg by mouth daily. 09/18/15   [provider]  aspirin EC 81 MG tablet Take 81 mg by mouth.    [provider]  atorvastatin (LIPITOR) 10 MG tablet Take 10 mg by mouth daily. 09/30/16 10/12/18  [provider]  atorvastatin (LIPITOR) 10 MG tablet Take 10 mg by mouth daily. 11/21/17   [provider]  baclofen (LIORESAL) 10 MG tablet Take 10 mg by mouth as needed. 10/03/15   [provider]  budesonide-formoterol (SYMBICORT) 160-4.5 MCG/ACT inhaler Inhale 1 puff into the lungs 2 (two) times daily. 09/30/16 03/10/18  [provider]  FLUoxetine (PROZAC) 20 MG capsule Take 20 mg by mouth daily. 09/18/15   [provider]  FLUoxetine (PROZAC) 40 MG capsule Take 40 mg by mouth daily. 11/24/17   [provider]  furosemide (LASIX) 20 MG tablet Take 20 mg by mouth daily as needed.  09/18/15   [provider]  gabapentin (NEURONTIN) 300 MG capsule Take 2 capsules (600 mg total) by mouth 3 (three) times daily. 10/17/16 03/10/18  Edward JollyLateef, Bilal, MD  lisinopril (ZESTRIL) 5 MG tablet Take 5 mg by mouth daily. 11/09/18 11/09/19  [provider]  omeprazole (PRILOSEC) 20 MG capsule Take 20 mg by mouth daily. 09/18/15   [provider]  tiotropium (SPIRIVA) 18 MCG inhalation capsule Place 18 mcg into inhaler and inhale daily. 09/18/15   [provider]  tiZANidine (ZANAFLEX) 4  MG tablet Take 1 tablet (4 mg total) by mouth every 8 (eight) hours as needed for muscle spasms. 01/05/18   Edward Jolly, MD    Allergies Acetaminophen and Lidocaine  Family History  Problem Relation Age of Onset  . Cancer Mother   . Diabetes Mother   . Heart disease Father     Social History Social History   Tobacco Use  . Smoking status: Current Some Day Smoker  . Smokeless tobacco: Former Neurosurgeon  . Tobacco comment: quit 2 years ago   Vaping Use  . Vaping Use: Never used  Substance Use Topics  . Alcohol use: No  . Drug use: No    Review of Systems  Unable to be obtained due to patient's respiratory status and acuity  ____________________________________________   PHYSICAL EXAM:  VITAL SIGNS: Vitals:   01/29/20 1430 01/29/20 1500  BP: 90/61 (!) 76/56  Pulse: 96 92  Resp: (!) 27 20  Temp:  98.8 F (37.1 C)  SpO2: 99% 98%     Constitutional: Obese, unresponsive.  Diaphoretic, tachypneic to the 40s on CMS CPAP.  Does not follow commands or open eyes to voice or noxious stimulation. Eyes: Conjunctivae are normal. Head: Atraumatic. Nose: No congestion/rhinnorhea. Mouth/Throat: Mucous membranes are dry.  Oropharynx non-erythematous.  Poor dentition noted with multiple caries. Neck: No stridor. No cervical spine tenderness to palpation. Cardiovascular: Tachycardic rate, regular rhythm. Grossly normal heart sounds.  Good peripheral circulation. Respiratory: Tachypneic into the 40s on EMS CPAP.  Poor air movement throughout with diffuse expiratory wheezes and bibasilar crackles.  Obviously respiratory distress. Gastrointestinal: Soft , nondistended.  Obese with multiple well-healed surgical incisions. Musculoskeletal: No signs of acute trauma to the lower extremities, no deformity noted to the extremities. Neurologic:  GCS (E1,V1,M4) GCS 6.  Skin:  Skin is warm, and moist with diaphoresis Psychiatric: Unable to be assessed  ____________________________________________   LABS (all labs ordered are listed, but only abnormal results are displayed)  Labs Reviewed  MAGNESIUM - Abnormal; Notable for the following components:      Result Value   Magnesium 2.6 (*)    All other components within normal limits  CBC WITH DIFFERENTIAL/PLATELET - Abnormal; Notable for the following components:   WBC 15.6 (*)    MCV 101.6 (*)    Platelets 450 (*)    Neutro Abs 11.1 (*)    Monocytes Absolute 1.2 (*)    Abs  Immature Granulocytes 0.13 (*)    All other components within normal limits  COMPREHENSIVE METABOLIC PANEL - Abnormal; Notable for the following components:   Sodium 133 (*)    Chloride 96 (*)    Glucose, Bld 270 (*)    Calcium 8.8 (*)    AST 119 (*)    ALT 60 (*)    All other components within normal limits  BLOOD GAS, ARTERIAL - Abnormal; Notable for the following components:   pH, Arterial 7.13 (*)    pCO2 arterial 86 (*)    pO2, Arterial 70 (*)    Bicarbonate 28.6 (*)    Acid-base deficit 2.9 (*)    All other components within normal limits  BRAIN NATRIURETIC PEPTIDE - Abnormal; Notable for the following components:   B Natriuretic Peptide 377.3 (*)    All other components within normal limits  URINALYSIS, COMPLETE (UACMP) WITH MICROSCOPIC - Abnormal; Notable for the following components:   Color, Urine AMBER (*)    APPearance CLOUDY (*)    Glucose, UA 150 (*)  Hgb urine dipstick SMALL (*)    Protein, ur >=300 (*)    Bacteria, UA RARE (*)    All other components within normal limits  BLOOD GAS, ARTERIAL - Abnormal; Notable for the following components:   pH, Arterial 7.25 (*)    pCO2 arterial 61 (*)    pO2, Arterial 130 (*)    All other components within normal limits  TROPONIN I (HIGH SENSITIVITY) - Abnormal; Notable for the following components:   Troponin I (High Sensitivity) 3,145 (*)    All other components within normal limits  TROPONIN I (HIGH SENSITIVITY) - Abnormal; Notable for the following components:   Troponin I (High Sensitivity) 5,177 (*)    All other components within normal limits  RESP PANEL BY RT-PCR (FLU A&B, COVID) ARPGX2  CULTURE, BLOOD (ROUTINE X 2)  CULTURE, BLOOD (ROUTINE X 2)  LACTIC ACID, PLASMA  APTT  PROTIME-INR  HEPARIN LEVEL (UNFRACTIONATED)  TROPONIN I (HIGH SENSITIVITY)   ____________________________________________  12 Lead EKG  Sinus rhythm, rate of 125 bpm.  Normal axis.  Normal intervals.  Submillimeter ST depressions to  inferior and lateral leads.  No STEMI criteria. ____________________________________________  RADIOLOGY  ED MD interpretation: KUB reviewed by me with OGT in appropriate position of the stomach.  Post intubation CXR reviewed by me with evidence of pulmonary vascular congestion, ETT in good position.  Official radiology report(s): CT Head Wo Contrast  Result Date: 01/29/2020 CLINICAL DATA:  54 year old male with history of altered mental status. Evaluate for intracranial hemorrhage. EXAM: CT HEAD WITHOUT CONTRAST TECHNIQUE: Contiguous axial images were obtained from the base of the skull through the vertex without intravenous contrast. COMPARISON:  None. FINDINGS: Brain: Well-defined areas of low attenuation in the basal ganglia bilaterally, compatible with old lacunar infarcts. Patchy and confluent areas of decreased attenuation are noted throughout the deep and periventricular white matter of the cerebral hemispheres bilaterally, compatible with chronic microvascular ischemic disease. Larger well-defined area of low attenuation in the right occipital lobe also compatible with an area of encephalomalacia from remote right occipital infarct. No evidence of acute infarction, hemorrhage, hydrocephalus, extra-axial collection or mass lesion/mass effect. Vascular: No hyperdense vessel or unexpected calcification. Skull: Normal. Negative for fracture or focal lesion. Sinuses/Orbits: No acute finding. Other: None. IMPRESSION: 1. No acute intracranial abnormalities. 2. Mild chronic microvascular ischemic changes in the cerebral white matter with multifocal bilateral old lacunar basal ganglia infarcts and old right occipital infarct, as detailed above. Electronically Signed   By: Trudie Reed M.D.   On: 01/29/2020 13:25   DG Chest Portable 1 View  Result Date: 01/29/2020 CLINICAL DATA:  Post intubation, shortness of breath EXAM: PORTABLE CHEST 1 VIEW COMPARISON:  04/04/2011 FINDINGS: Endotracheal tube  6.4 cm above the carina. NG tube enters the stomach with the tip not visualized. Monitor leads and defibrillator pad over the left chest. Heart is enlarged with diffuse bilateral interstitial opacities and peripheral Kerley B lines favored to be interstitial edema. CHF is favored. Streaky bibasilar opacities compatible with atelectasis. No large effusion or pneumothorax. IMPRESSION: Endotracheal tube 6.4 cm above the carina. Cardiomegaly with new interstitial edema pattern and bibasilar atelectasis. Electronically Signed   By: Judie Petit.  Shick M.D.   On: 01/29/2020 11:10   DG Abd Portable 1 View  Result Date: 01/29/2020 CLINICAL DATA:  OG tube placement EXAM: PORTABLE ABDOMEN - 1 VIEW COMPARISON:  01/29/2020 FINDINGS: OG tube enters the proximal stomach.  Mild gastric distension noted. Degenerative changes of the spine. Cardiomegaly evident with diffuse  interstitial opacities/edema and basilar atelectasis. IMPRESSION: OG tube within the proximal stomach. Electronically Signed   By: Judie Petit.  Shick M.D.   On: 01/29/2020 11:11    ____________________________________________   PROCEDURES and INTERVENTIONS  Procedure(s) performed (including Critical Care):  .1-3 Lead EKG Interpretation Performed by: Delton Prairie, MD Authorized by: Delton Prairie, MD     Interpretation: abnormal     ECG rate:  124   ECG rate assessment: tachycardic     Rhythm: sinus tachycardia     Ectopy: none     Conduction: normal   Procedure Name: Intubation Date/Time: 01/29/2020 12:46 PM Performed by: Delton Prairie, MD Pre-anesthesia Checklist: Patient identified, Patient being monitored, Emergency Drugs available, Timeout performed and Suction available Oxygen Delivery Method: Ambu bag Preoxygenation: Pre-oxygenation with 100% oxygen Induction Type: Rapid sequence Ventilation: Mask ventilation without difficulty Laryngoscope Size: Glidescope and 4 Tube size: 8.0 mm Number of attempts: 1 Placement Confirmation: ETT inserted  through vocal cords under direct vision,  CO2 detector and Breath sounds checked- equal and bilateral Secured at: 25 (teeth) cm Tube secured with: ETT holder Dental Injury: Teeth and Oropharynx as per pre-operative assessment  Comments: Poor dentition    .Critical Care Performed by: Delton Prairie, MD Authorized by: Delton Prairie, MD   Critical care provider statement:    Critical care time (minutes):  95   Critical care was necessary to treat or prevent imminent or life-threatening deterioration of the following conditions:  Sepsis, respiratory failure and cardiac failure   Critical care was time spent personally by me on the following activities:  Discussions with consultants, evaluation of patient's response to treatment, examination of patient, ordering and performing treatments and interventions, ordering and review of laboratory studies, ordering and review of radiographic studies, pulse oximetry, re-evaluation of patient's condition, obtaining history from patient or surrogate and review of old charts    Medications  etomidate (AMIDATE) injection (20 mg Intravenous Given 01/29/20 1039)  rocuronium (ZEMURON) injection (100 mg Intravenous Given 01/29/20 1039)  fentaNYL in NS (25mcg/ml) infusion-PREMIX (100 mcg/hr Intravenous Rate/Dose Change 01/29/20 1239)  lactated ringers infusion ( Intravenous New Bag/Given 01/29/20 1438)  heparin bolus via infusion 4,000 Units (4,000 Units Intravenous Bolus from Bag 01/29/20 1432)    Followed by  heparin ADULT infusion 100 units/mL (25000 units/233mL sodium chloride 0.45%) (1,400 Units/hr Intravenous New Bag/Given 01/29/20 1431)  propofol (DIPRIVAN) 1000 MG/100ML infusion (50 mcg/kg/min  142 kg Intravenous New Bag/Given 01/29/20 1335)  vancomycin (VANCOREADY) IVPB 1500 mg/300 mL (has no administration in time range)  rocuronium (ZEMURON) injection 100 mg (has no administration in time range)  albuterol (PROVENTIL) (2.5 MG/3ML)  0.083% nebulizer solution 10 mg (10 mg Nebulization Given by Other 01/29/20 1107)  ipratropium (ATROVENT) nebulizer solution 0.5 mg (0.5 mg Nebulization Given by Other 01/29/20 1107)  sodium chloride 0.9 % bolus 1,000 mL (0 mLs Intravenous Stopped 01/29/20 1225)  ceFEPIme (MAXIPIME) 2 g in sodium chloride 0.9 % 100 mL IVPB (0 g Intravenous Stopped 01/29/20 1300)  vancomycin (VANCOCIN) IVPB 1000 mg/200 mL premix (0 mg Intravenous Stopped 01/29/20 1440)  acetaminophen (TYLENOL) suppository 650 mg (650 mg Rectal Given 01/29/20 1158)  fentaNYL (SUBLIMAZE) injection 100 mcg (100 mcg Intravenous Given 01/29/20 1145)  fentaNYL (SUBLIMAZE) injection 100 mcg (100 mcg Intravenous Given 01/29/20 1238)  albuterol (PROVENTIL) (2.5 MG/3ML) 0.083% nebulizer solution 10 mg (10 mg Nebulization Given 01/29/20 1316)  furosemide (LASIX) injection 80 mg (80 mg Intravenous Given 01/29/20 1346)  vecuronium (NORCURON) injection 10 mg (10 mg Intravenous Given  01/29/20 1345)  aspirin suppository 300 mg (300 mg Rectal Given 01/29/20 1514)    ____________________________________________   MDM / ED COURSE   Obese 54 year old male presents to the ED from home in respiratory distress with evidence of COPD/CHF exacerbation requiring intubation, with evidence of sepsis and secondary NSTEMI, requiring transfer for ICU admission.  Patient presented on EMS CPAP with a GCS of 6 necessitating urgent intubation.  Found with evidence of both COPD and CHF exacerbations, contributing to his respiratory status.  Work-up with evidence of NSTEMI necessitating heparin drip.  Fever indicating sepsis necessitating blood cultures and broad-spectrum antibiotics.  First ABG with pH of 7.13 and evidence of respiratory acidosis, for which ventilator changes were made to increase his minute ventilation, and second ABG demonstrates improvement.  CT head without evidence of ICH or CVA as the source of his altered mentation.  Likely primarily  respiratory etiology.  Pressures are softening at the time of signout to oncoming physician, and additional liter of fluids is ordered at this time with expectation that any further hypotension would necessitate pressors.  Patient signed out to oncoming physician, Dr. Antoine Primas, due to no ICU beds available throughout the state to facilitate continued work-up in the ED and disposition to an ICU.   Clinical Course as of 01/29/20 1527  Sat Jan 29, 2020  1059 Xrays reviewed on portable machine [DS]  1117 I speak with [facility] and ICU physician at [facility] who is happy to accept, but they have no beds. He is Number 2 on their waiting list and a "high priority."  [DS]  1120 RN informs me of fever. Cultures and abx ordered [DS]  1142 Trop noted. Heparin ordered. EKG reviewed again, I ask RN to acquire another EKG [DS]  1207 Reassessed. RT making tweeks to the vent going from volume to pressure control. [facility], [facility] and [facility] are all full and not excepting transfers and do not have a wait list [DS]  1208 . [DS]  1229 Spoke with patient's wife and brother in our family conference room.  Explained his severe illness, intubated status and need for ICU admission.  Answered questions. [DS]  1235 Return to the bedside because patient was bucking the vent.  Increase propofol to 50 mics per kilo per minute and increased fentanyl drip to 100 mics per hour after bolus of 100 mics of fentanyl.  Continues to have poor compliance on the ventilator and continued expiratory wheezes with poor air movement.  We will do additional albuterol. [DS]  1337 Continues to have high peak and plateau pressures despite multiple tweeks to the ventilator.  Discussed with wife and brother, who are now at the bedside, my recommendation for paralysis to facilitate his respiratory compliance.  They are agreeable. [DS]    Clinical Course User Index [DS] Delton Prairie, MD     ____________________________________________   FINAL CLINICAL IMPRESSION(S) / ED DIAGNOSES  Final diagnoses:  Hypoxia  Shortness of breath  COPD exacerbation (HCC)  Respiratory distress  Sepsis with acute hypoxic respiratory failure without septic shock, due to unspecified organism [facility])  Acute respiratory failure with hypoxia and hypercapnia (HCC)  NSTEMI (non-ST elevated myocardial infarction) [facility])     ED Discharge Orders    None       Tyleigh Mahn   Note:  This document was prepared using Dragon voice recognition software and may include unintentional dictation errors.   Delton Prairie, MD 01/29/20 [phone]

## 2020-01-29 NOTE — Progress Notes (Signed)
eLink Physician-Brief Progress Note Patient Name: Tommy Gaines DOB: [birth year] MRN: [id number]   Date of Service  01/29/2020  HPI/Events of Note  Patient transferred from [facility] ER where he was admitted for acute hypoxic / hypercapnic respiratory failure secondary to COPD exacerbation and suspected pneumonia .  eICU Interventions  New Patient Evaluation completed.        [name] 01/29/2020, 10:54 PM

## 2020-01-29 NOTE — Progress Notes (Signed)
Critical care acceptance note.  Patient was evaluated and chart was reviewed. Patient being in admitted by critical care group earlier in the afternoon was transferred from [facility] to [facility] intensive care unit.  This is a 54 year old white male who presented to the emergency room at [facility] for respiratory distress.  He was found to have acute exacerbation of COPD which is complicated with acute systolic cardiac heart failure and NSTEMI.  Patient required mechanical ventilation and low-dose vasoactive support with Levophed.  HEENT: Atraumatic/normocephalic mucous membranes are moist patient is orally intubated with endotracheal tube. Heart: Regular rate no murmur rub or gallop Lungs: Bilateral wheezing heard in all lung fields.  No rales or rhonchi noted. Abdomen: Soft, distended, hypoactive bowel sounds, obese, no rebound/rigidity/guarding though limited by neurologic status. Extremities: Distal pulse intact x4.  +2 edema of the lower extremities.  No cyanosis.  Assessment: Acute respiratory failure COPD exacerbation NSTEMI Acute decompensated systolic heart failure Obesity Acute kidney injury  Plan: Patient is transferred to the intensive care unit from [facility]. Continue with current ventilator support.  Peak airway pressures are now down from the 50s to the 30s. Wean FiO2 for oxygen saturations greater than 90%. Wean Levophed for MAP greater than 60. Bedside echocardiogram was completed.  Demonstrated used ejection fraction approximately 25%.  There is apical hypokinesis.  RV is mildly dilated.  No pericardial effusion seen. Monitor I/os. N.p.o. Continue empiric antibiotics.

## 2020-01-29 NOTE — ED Notes (Signed)
britton lee, np notified regarding repeat troponin level.

## 2020-01-29 NOTE — ED Notes (Signed)
Family brought back from family wait room. Primary RN made aware

## 2020-01-29 NOTE — Progress Notes (Signed)
ANTICOAGULATION CONSULT NOTE - Initial Consult  Pharmacy Consult for heparin Indication: chest pain/ACS  Allergies  Allergen Reactions  . Acetaminophen Nausea And Vomiting  . Lidocaine Rash    Patient Measurements: Weight: (!) 142 kg (313 lb 0.9 oz)  Height 6' 1.5" from Care Everywhere (pt unable to provide) Heparin Dosing Weight: 114.5 kg  Vital Signs: Temp: 101.7 F (38.7 C) (12/11 1100) Temp Source: Rectal (12/11 1100) BP: 190/129 (12/11 1115) Pulse Rate: 136 (12/11 1115)  Labs: Recent Labs    01/29/20 1041  HGB 14.2  HCT 43.3  PLT 450*  CREATININE 1.24  TROPONINIHS 3,145*    CrCl cannot be calculated (Unknown ideal weight.).   Medical History: Past Medical History:  Diagnosis Date  . Arthritis   . COPD (chronic obstructive pulmonary disease) (HCC)   . Depression   . Diverticulitis   . Hyperlipidemia   . Hypertension   . Stroke [facility]) 2016   confirmed by CT scan     Assessment: 54 yo male here with respiratory distress starting on heparin drip; troponin 3145. No oral anticoagulants noted on PTA med list.   Goal of Therapy:  Heparin level 0.3-0.7 units/ml Monitor platelets by anticoagulation protocol: Yes   Plan:  Give 4000 units bolus x 1 Start heparin infusion at 1400 units/hr Check anti-Xa level in 6 hours and daily while on heparin Continue to monitor H&H and platelets  Crist Fat L 01/29/2020,12:06 PM

## 2020-01-29 NOTE — ED Provider Notes (Signed)
I seen care of this patient by [facility] provider approximately 1500.  Please see Afrin prior sent for full details.  Patient initial evaluation assessment.  In brief patient presented in respiratory distress and was intubated with concerns for acute hypoxic hypercarbic respiratory failure.  Concern for NSTEMI and COPD exacerbation.  We will plan to admit to medical ICU service for evaluation management.   Gilles Chiquito, MD 01/29/20 [phone]

## 2020-01-29 NOTE — ED Triage Notes (Addendum)
Pt from home via [facility] w/respiratory distress, sats 50% on RA. Arrives on CPAP, sats in 70%'s, limited air movement. Given 2g Mg en route, 0.3 Epi, 2 duonebs, 125mg  Solumedrol, and 2 Albuterol trx PTA. GCS 5 on arrival, preparing for intubation. Line to LFA. Rectal temp 101.7, EMS mentioned tooth abcess development x 1 wk

## 2020-01-29 NOTE — Progress Notes (Addendum)
Pharmacy Antibiotic Note  Tommy Gaines is a 54 y.o. male admitted on 01/29/2020 with sepsis,PNA.  Pharmacy has been consulted for Cefepime and Vancomycin dosing. Pt transferred from [facility] ED - 2gm Cefepime ~1300, 1gm Vanc ~1300 and Vanc 1.5 gm ~1640.  Plan: Cefepime 2gm IV q8h Vancomycin 1500 mg IV Q 12 hrs. Will f/u renal function, micro data, and pt's clinical condition Vanc levels prn      Temp (24hrs), Avg:99.1 F (37.3 C), Min:98.1 F (36.7 C), Max:101.7 F (38.7 C)  Recent Labs  Lab 01/29/20 1041 01/29/20 1216  WBC 15.6*  --   CREATININE 1.24  --   LATICACIDVEN  --  1.3    Estimated Creatinine Clearance: 98.5 mL/min (by C-G formula based on SCr of 1.24 mg/dL).    Allergies  Allergen Reactions  . Acetaminophen Nausea And Vomiting  . Lidocaine Rash    Antimicrobials this admission: 12/11 Vanc >>  12/11 Cefepime >>   Microbiology results: 12/11 BCx [facility]):   Thank you for allowing pharmacy to be a part of this patient's care.  Christoper Fabian, PharmD, BCPS Please see amion for complete clinical pharmacist phone list 01/29/2020 11:25 PM

## 2020-01-29 NOTE — Progress Notes (Signed)
CODE SEPSIS - PHARMACY COMMUNICATION  **Broad Spectrum Antibiotics should be administered within 1 hour of Sepsis diagnosis**  Time Code Sepsis Called/Page Received: 1119  Antibiotics Ordered: cefepime and vancomycin  Time of 1st antibiotic administration: 1214  Additional action taken by pharmacy: Called ED RN @ 1158, informed RN of ~20 minutes left for abx administration  If necessary, Name of Provider/Nurse Contacted:     Marty Heck ,PharmD Clinical Pharmacist  01/29/2020  12:04 PM

## 2020-01-29 NOTE — H&P (Signed)
Name: Tommy Gaines MRN: [id number] DOB: 01-Oct-1965     CONSULTATION DATE: 01/29/2020  REFERRING MD : Katrinka Blazing CHIEF COMPLAINT: resp failure  HISTORY OF PRESENT ILLNESS:   54 y.o. malewho presents to the ED for evaluation of respiratory distress.  Chart review indicates history of HTN, HLD, obesity, COPD, CHF and stroke.   Patient presents to the ED in extremis and severe criticall illness with severe hypoxia failed CPAP. Patient is unable to provide any history due to acuity of condition his respiratory status.  H/o of Severe ETOH abuse    ER Course Patient failed CPAP 7.13/86/70 Creat 1.24 TROP 6800 LA 1.3  Emergently intubated Dx of COPD exacerbation Dx of SEPSIS on admission Dx of NSTEMI Dx of PNEUMONIA    PAST MEDICAL HISTORY :   has a past medical history of Arthritis, COPD (chronic obstructive pulmonary disease) (HCC), Depression, Diverticulitis, Hyperlipidemia, Hypertension, and Stroke (HCC) (2016).  has a past surgical history that includes intestinal rupture (2012) and Joint replacement (Bilateral). Prior to Admission medications   Medication Sig Start Date End Date Taking? Authorizing Provider  albuterol (PROVENTIL HFA;VENTOLIN HFA) 108 (90 Base) MCG/ACT inhaler Inhale 2 puffs into the lungs 3 (three) times daily. 09/18/15   [provider]  amLODipine (NORVASC) 5 MG tablet Take 5 mg by mouth daily. 09/18/15   [provider]  aspirin EC 81 MG tablet Take 81 mg by mouth.    [provider]  atorvastatin (LIPITOR) 10 MG tablet Take 10 mg by mouth daily. 09/30/16 10/12/18  [provider]  atorvastatin (LIPITOR) 10 MG tablet Take 10 mg by mouth daily. 11/21/17   [provider]  baclofen (LIORESAL) 10 MG tablet Take 10 mg by mouth as needed. 10/03/15   [provider]  budesonide-formoterol (SYMBICORT) 160-4.5 MCG/ACT inhaler Inhale 1 puff into the lungs 2 (two) times daily. 09/30/16 03/10/18  [provider]  FLUoxetine (PROZAC) 20 MG capsule Take 20 mg by mouth daily. 09/18/15   [provider]  FLUoxetine (PROZAC) 40 MG capsule Take 40 mg by mouth daily. 11/24/17   [provider]  furosemide (LASIX) 20 MG tablet Take 20 mg by mouth daily as needed.  09/18/15   [provider]  gabapentin (NEURONTIN) 300 MG capsule Take 2 capsules (600 mg total) by mouth 3 (three) times daily. 10/17/16 03/10/18  Edward Jolly, MD  lisinopril (ZESTRIL) 5 MG tablet Take 5 mg by mouth daily. 11/09/18 11/09/19  [provider]  omeprazole (PRILOSEC) 20 MG capsule Take 20 mg by mouth daily. 09/18/15   [provider]  tiotropium (SPIRIVA) 18 MCG inhalation capsule Place 18 mcg into inhaler and inhale daily. 09/18/15   [provider]  tiZANidine (ZANAFLEX) 4 MG tablet Take 1 tablet (4 mg total) by mouth every 8 (eight) hours as needed for muscle spasms. 01/05/18   Edward Jolly, MD   Allergies  Allergen Reactions  . Acetaminophen Nausea And Vomiting  . Lidocaine Rash    FAMILY HISTORY:  family history includes Cancer in his mother; Diabetes in his mother; Heart disease in his father. SOCIAL HISTORY:  reports that he has been smoking. He has quit using smokeless tobacco. He reports that he does not drink alcohol and does not use drugs.  REVIEW OF SYSTEMS:   Unable to obtain due to critical illness      Estimated body mass index is 40.74 kg/m as calculated from the following:   Height as of 08/02/19: 6' 1.5" (1.867 m).  Weight as of this encounter: 142 kg.    VITAL SIGNS: Temp:  [98.8 F (37.1 C)-101.7 F (38.7 C)] 98.8 F (37.1 C) (12/11 1500) Pulse Rate:  [85-140] 85 (12/11 1645) Resp:  [0-30] 20 (12/11 1645) BP: (66-203)/(52-129) 85/62 (12/11 1645) SpO2:  [93 %-100 %] 100 % (12/11 1645) FiO2 (%):  [50 %] 50 % (12/11 1500) Weight:  [142 kg-145.2 kg] 142 kg (12/11 1200)   No intake/output data recorded. Total I/O In: 2000 [IV  Piggyback:2000] Out: 770 [Urine:270; Emesis/NG output:500]   SpO2: 100 % FiO2 (%): 50 %   Physical Examination:  GENERAL:critically ill appearing, +resp distress NECK: Supple. No JVD.  PULMONARY: +rhonchi, +wheezing CARDIOVASCULAR: S1 and S2. Regular rate and rhythm. No murmurs, rubs, or gallops.  GASTROINTESTINAL: Soft, nontender, -distended.  Positive bowel sounds.  MUSCULOSKELETAL: No swelling, clubbing, or edema.  NEUROLOGIC: obtunded SKIN:intact,warm,dry  MEDICATIONS: I have reviewed all medications and confirmed regimen as documented   CULTURE RESULTS   Recent Results (from the past 240 hour(s))  Resp Panel by RT-PCR (Flu A&B, Covid) Nasopharyngeal Swab     Status: None   Collection Time: 01/29/20 10:55 AM   Specimen: Nasopharyngeal Swab; Nasopharyngeal(NP) swabs in vial transport medium  Result Value Ref Range Status   SARS Coronavirus 2 by RT PCR NEGATIVE NEGATIVE Final    Comment: (NOTE) SARS-CoV-2 target nucleic acids are NOT DETECTED.  The SARS-CoV-2 RNA is generally detectable in upper respiratory specimens during the acute phase of infection. The lowest concentration of SARS-CoV-2 viral copies this assay can detect is 138 copies/mL. A negative result does not preclude SARS-Cov-2 infection and should not be used as the sole basis for treatment or other patient management decisions. A negative result may occur with  improper specimen collection/handling, submission of specimen other than nasopharyngeal swab, presence of viral mutation(s) within the areas targeted by this assay, and inadequate number of viral copies(<138 copies/mL). A negative result must be combined with clinical observations, patient history, and epidemiological information. The expected result is Negative.  Fact Sheet for Patients:  BloggerCourse.com  Fact Sheet for Healthcare Providers:  SeriousBroker.it  This test is no t yet approved  or cleared by the [place] FDA and  has been authorized for detection and/or diagnosis of SARS-CoV-2 by FDA under an Emergency Use Authorization (EUA). This EUA will remain  in effect (meaning this test can be used) for the duration of the COVID-19 declaration under Section 564(b)(1) of the Act, 21 U.S.C.section 360bbb-3(b)(1), unless the authorization is terminated  or revoked sooner.       Influenza A by PCR NEGATIVE NEGATIVE Final   Influenza B by PCR NEGATIVE NEGATIVE Final    Comment: (NOTE) The Xpert Xpress SARS-CoV-2/FLU/RSV plus assay is intended as an aid in the diagnosis of influenza from Nasopharyngeal swab specimens and should not be used as a sole basis for treatment. Nasal washings and aspirates are unacceptable for Xpert Xpress SARS-CoV-2/FLU/RSV testing.  Fact Sheet for Patients: BloggerCourse.com  Fact Sheet for Healthcare Providers: SeriousBroker.it  This test is not yet approved or cleared by the [place] FDA and has been authorized for detection and/or diagnosis of SARS-CoV-2 by FDA under an Emergency Use Authorization (EUA). This EUA will remain in effect (meaning this test can be used) for the duration of the COVID-19 declaration under Section 564(b)(1) of the Act, 21 U.S.C. section 360bbb-3(b)(1), unless the authorization is terminated or revoked.  Performed at [facility], [address], [place], [place] [address]  CBC    Component Value Date/Time   WBC 15.6 (H) 01/29/2020 1041   RBC 4.26 01/29/2020 1041   HGB 14.2 01/29/2020 1041   HGB 14.4 04/04/2011 1047   HCT 43.3 01/29/2020 1041   HCT 42.3 04/04/2011 1047   PLT 450 (H) 01/29/2020 1041   PLT 236 04/04/2011 1047   MCV 101.6 (H) 01/29/2020 1041   MCV 99 04/04/2011 1047   MCH 33.3 01/29/2020 1041   MCHC 32.8 01/29/2020 1041   RDW 13.6 01/29/2020 1041   RDW 13.4 04/04/2011 1047   LYMPHSABS 3.0 01/29/2020 1041    MONOABS 1.2 (H) 01/29/2020 1041   EOSABS 0.1 01/29/2020 1041   BASOSABS 0.1 01/29/2020 1041   BMP Latest Ref Rng & Units 01/29/2020 04/04/2011  Glucose 70 - 99 mg/dL 299(M) 426(S)  BUN 6 - 20 mg/dL 14 10  Creatinine 3.41 - 1.24 mg/dL 9.62 2.29  Sodium 798 - 145 mmol/L 133(L) 140  Potassium 3.5 - 5.1 mmol/L 5.1 4.1  Chloride 98 - 111 mmol/L 96(L) 106  CO2 22 - 32 mmol/L 24 28  Calcium 8.9 - 10.3 mg/dL 9.2(J) 8.7      IMAGING    CT Head Wo Contrast  Result Date: 01/29/2020 CLINICAL DATA:  54 year old male with history of altered mental status. Evaluate for intracranial hemorrhage. EXAM: CT HEAD WITHOUT CONTRAST TECHNIQUE: Contiguous axial images were obtained from the base of the skull through the vertex without intravenous contrast. COMPARISON:  None. FINDINGS: Brain: Well-defined areas of low attenuation in the basal ganglia bilaterally, compatible with old lacunar infarcts. Patchy and confluent areas of decreased attenuation are noted throughout the deep and periventricular white matter of the cerebral hemispheres bilaterally, compatible with chronic microvascular ischemic disease. Larger well-defined area of low attenuation in the right occipital lobe also compatible with an area of encephalomalacia from remote right occipital infarct. No evidence of acute infarction, hemorrhage, hydrocephalus, extra-axial collection or mass lesion/mass effect. Vascular: No hyperdense vessel or unexpected calcification. Skull: Normal. Negative for fracture or focal lesion. Sinuses/Orbits: No acute finding. Other: None. IMPRESSION: 1. No acute intracranial abnormalities. 2. Mild chronic microvascular ischemic changes in the cerebral white matter with multifocal bilateral old lacunar basal ganglia infarcts and old right occipital infarct, as detailed above. Electronically Signed   By: Trudie Reed M.D.   On: 01/29/2020 13:25   DG Chest Portable 1 View  Result Date: 01/29/2020 CLINICAL DATA:  Post  intubation, shortness of breath EXAM: PORTABLE CHEST 1 VIEW COMPARISON:  04/04/2011 FINDINGS: Endotracheal tube 6.4 cm above the carina. NG tube enters the stomach with the tip not visualized. Monitor leads and defibrillator pad over the left chest. Heart is enlarged with diffuse bilateral interstitial opacities and peripheral Kerley B lines favored to be interstitial edema. CHF is favored. Streaky bibasilar opacities compatible with atelectasis. No large effusion or pneumothorax. IMPRESSION: Endotracheal tube 6.4 cm above the carina. Cardiomegaly with new interstitial edema pattern and bibasilar atelectasis. Electronically Signed   By: Judie Petit.  Shick M.D.   On: 01/29/2020 11:10   DG Abd Portable 1 View  Result Date: 01/29/2020 CLINICAL DATA:  OG tube placement EXAM: PORTABLE ABDOMEN - 1 VIEW COMPARISON:  01/29/2020 FINDINGS: OG tube enters the proximal stomach.  Mild gastric distension noted. Degenerative changes of the spine. Cardiomegaly evident with diffuse interstitial opacities/edema and basilar atelectasis. IMPRESSION: OG tube within the proximal stomach. Electronically Signed   By: Judie Petit.  Shick M.D.   On: 01/29/2020 11:11     Indwelling Urinary Catheter continued, requirement  due to   Reason to continue Indwelling Urinary Catheter strict Intake/Output monitoring for hemodynamic instability         Ventilator continued, requirement due to severe respiratory failure   Ventilator Sedation RASS 0 to -2      ASSESSMENT AND PLAN SYNOPSIS  54 yo morbidly obese white male with acute and severe hypoxia resp failure and hypercapnic resp failure with acute COPD exacerbation with pneumonia and Sepsis complicated by NSTEMI  Severe ACUTE Hypoxic and Hypercapnic Respiratory Failure -continue Full MV support -continue Bronchodilator Therapy -Wean Fio2 and PEEP as tolerated -VAP/VENT bundle implementation  ACUTE SYSTOLIC CARDIAC FAILURE- with NSTEMI -oxygen as needed -Lasix as tolerated -follow up  cardiac enzymes as indicated -follow up cardiology recs Continue IV heparin  SEVERE COPD EXACERBATION -continue IV steroids as prescribed -continue NEB THERAPY as prescribed -morphine as needed -wean fio2 as needed and tolerated    Morbid obesity, possible OSA.   Will certainly impact respiratory mechanics, ventilator weaning Suspect will need to consider additional PEEP  ACUTE KIDNEY INJURY/Renal Failure -continue Foley Catheter-assess need -Avoid nephrotoxic agents -Follow urine output, BMP -Ensure adequate renal perfusion, optimize oxygenation -Renal dose medications     NEUROLOGY Acute toxic metabolic encephalopathy, need for sedation Goal RASS -2 to -3 Heavy ETOH abuse is noted   SHOCK-SEPSIS -use vasopressors to keep MAP>65  CARDIAC ICU monitoring  ID -continue IV abx as prescibed -follow up cultures  GI GI PROPHYLAXIS as indicated  NUTRITIONAL STATUS DIET-->NPO Constipation protocol as indicated   ENDO - will use ICU hypoglycemic\Hyperglycemia protocol if needed    ELECTROLYTES -follow labs as needed -replace as needed -pharmacy consultation and following    DVT/GI PRX ordered and assessed TRANSFUSIONS AS NEEDED MONITOR FSBS I Assessed the need for Labs I Assessed the need for Foley I Assessed the need for Central Venous Line Family Discussion when available I Assessed the need for Mobilization I made an Assessment of medications to be adjusted accordingly Safety Risk assessment Completed  CASE DISCUSSED IN MULTIDISCIPLINARY ROUNDS WITH ICU TEAM   Critical Care Time devoted to patient care services described in this note is 75 minutes.   Overall, patient is critically ill, prognosis is guarded.  Patient with Multiorgan failure and at high risk for cardiac arrest and death.    Lucie LeatherKurian David Karrington Mccravy, M.D.  [facility]  Medical Director [facility] Medical Director [facility]  [facility]

## 2020-01-30 ENCOUNTER — Inpatient Hospital Stay (HOSPITAL_COMMUNITY): Payer: Medicaid Other

## 2020-01-30 ENCOUNTER — Encounter (HOSPITAL_COMMUNITY)
Admission: AD | Disposition: A | Discharge status: Home / Self Care | Payer: Self-pay | Source: Other Acute Inpatient Hospital | Attending: Cardiology

## 2020-01-30 DIAGNOSIS — I214 Non-ST elevation (NSTEMI) myocardial infarction: Secondary | ICD-10-CM

## 2020-01-30 DIAGNOSIS — I340 Nonrheumatic mitral (valve) insufficiency: Secondary | ICD-10-CM

## 2020-01-30 DIAGNOSIS — I5021 Acute systolic (congestive) heart failure: Secondary | ICD-10-CM

## 2020-01-30 DIAGNOSIS — I5023 Acute on chronic systolic (congestive) heart failure: Secondary | ICD-10-CM

## 2020-01-30 DIAGNOSIS — J9601 Acute respiratory failure with hypoxia: Secondary | ICD-10-CM

## 2020-01-30 DIAGNOSIS — R570 Cardiogenic shock: Secondary | ICD-10-CM

## 2020-01-30 HISTORY — PX: CORONARY STENT INTERVENTION: CATH118234

## 2020-01-30 HISTORY — PX: RIGHT/LEFT HEART CATH AND CORONARY ANGIOGRAPHY: CATH118266

## 2020-01-30 LAB — POCT I-STAT 7, (LYTES, BLD GAS, ICA,H+H)
Acid-Base Excess: 0 mmol/L (ref 0.0–2.0)
Acid-Base Excess: 2 mmol/L (ref 0.0–2.0)
Acid-Base Excess: 3 mmol/L — ABNORMAL HIGH (ref 0.0–2.0)
Bicarbonate: 26.7 mmol/L (ref 20.0–28.0)
Bicarbonate: 27.9 mmol/L (ref 20.0–28.0)
Bicarbonate: 28.3 mmol/L — ABNORMAL HIGH (ref 20.0–28.0)
Calcium, Ion: 1.15 mmol/L (ref 1.15–1.40)
Calcium, Ion: 1.15 mmol/L (ref 1.15–1.40)
Calcium, Ion: 1.15 mmol/L (ref 1.15–1.40)
HCT: 33 % — ABNORMAL LOW (ref 39.0–52.0)
HCT: 34 % — ABNORMAL LOW (ref 39.0–52.0)
HCT: 36 % — ABNORMAL LOW (ref 39.0–52.0)
Hemoglobin: 11.2 g/dL — ABNORMAL LOW (ref 13.0–17.0)
Hemoglobin: 11.6 g/dL — ABNORMAL LOW (ref 13.0–17.0)
Hemoglobin: 12.2 g/dL — ABNORMAL LOW (ref 13.0–17.0)
O2 Saturation: 83 %
O2 Saturation: 98 %
O2 Saturation: 98 %
Patient temperature: 98
Patient temperature: 98
Patient temperature: 98.5
Potassium: 4.1 mmol/L (ref 3.5–5.1)
Potassium: 4.1 mmol/L (ref 3.5–5.1)
Potassium: 4.7 mmol/L (ref 3.5–5.1)
Sodium: 135 mmol/L (ref 135–145)
Sodium: 135 mmol/L (ref 135–145)
Sodium: 137 mmol/L (ref 135–145)
TCO2: 28 mmol/L (ref 22–32)
TCO2: 30 mmol/L (ref 22–32)
TCO2: 30 mmol/L (ref 22–32)
pCO2 arterial: 38.7 mmHg (ref 32.0–48.0)
pCO2 arterial: 43.1 mmHg (ref 32.0–48.0)
pCO2 arterial: 56.9 mmHg — ABNORMAL HIGH (ref 32.0–48.0)
pH, Arterial: 7.298 — ABNORMAL LOW (ref 7.350–7.450)
pH, Arterial: 7.424 (ref 7.350–7.450)
pH, Arterial: 7.445 (ref 7.350–7.450)
pO2, Arterial: 107 mmHg (ref 83.0–108.0)
pO2, Arterial: 112 mmHg — ABNORMAL HIGH (ref 83.0–108.0)
pO2, Arterial: 44 mmHg — ABNORMAL LOW (ref 83.0–108.0)

## 2020-01-30 LAB — GLUCOSE, CAPILLARY
Glucose-Capillary: 174 mg/dL — ABNORMAL HIGH (ref 70–99)
Glucose-Capillary: 139 mg/dL — ABNORMAL HIGH (ref 70–99)
Glucose-Capillary: 142 mg/dL — ABNORMAL HIGH (ref 70–99)

## 2020-01-30 LAB — BASIC METABOLIC PANEL
Anion gap: 11 (ref 5–15)
BUN: 15 mg/dL (ref 6–20)
CO2: 24 mmol/L (ref 22–32)
Calcium: 8.2 mg/dL — ABNORMAL LOW (ref 8.9–10.3)
Chloride: 99 mmol/L (ref 98–111)
Creatinine, Ser: 1.37 mg/dL — ABNORMAL HIGH (ref 0.61–1.24)
GFR, Estimated: >60 mL/min (ref >60)
Glucose, Bld: 176 mg/dL — ABNORMAL HIGH (ref 70–99)
Potassium: 4.7 mmol/L (ref 3.5–5.1)
Sodium: 134 mmol/L — ABNORMAL LOW (ref 135–145)

## 2020-01-30 LAB — ECHOCARDIOGRAM COMPLETE
Area-P 1/2: 4.8 cm2
Height: 73 in
S' Lateral: 5.4 cm
Weight: 5319.26 oz

## 2020-01-30 LAB — COMPREHENSIVE METABOLIC PANEL
ALT: 70 U/L — ABNORMAL HIGH (ref 0–44)
AST: 152 U/L — ABNORMAL HIGH (ref 15–41)
Albumin: 2.7 g/dL — ABNORMAL LOW (ref 3.5–5.0)
Alkaline Phosphatase: 56 U/L (ref 38–126)
Anion gap: 11 (ref 5–15)
BUN: 17 mg/dL (ref 6–20)
CO2: 26 mmol/L (ref 22–32)
Calcium: 8.1 mg/dL — ABNORMAL LOW (ref 8.9–10.3)
Chloride: 100 mmol/L (ref 98–111)
Creatinine, Ser: 1.21 mg/dL (ref 0.61–1.24)
GFR, Estimated: >60 mL/min (ref >60)
Glucose, Bld: 160 mg/dL — ABNORMAL HIGH (ref 70–99)
Potassium: 4 mmol/L (ref 3.5–5.1)
Sodium: 137 mmol/L (ref 135–145)
Total Bilirubin: 0.5 mg/dL (ref 0.3–1.2)
Total Protein: 6.2 g/dL — ABNORMAL LOW (ref 6.5–8.1)

## 2020-01-30 LAB — CBC
HCT: 37.6 % — ABNORMAL LOW (ref 39.0–52.0)
Hemoglobin: 12.2 g/dL — ABNORMAL LOW (ref 13.0–17.0)
MCH: 32.6 pg (ref 26.0–34.0)
MCHC: 32.4 g/dL (ref 30.0–36.0)
MCV: 100.5 fL — ABNORMAL HIGH (ref 80.0–100.0)
Platelets: 267 10*3/uL (ref 150–400)
RBC: 3.74 MIL/uL — ABNORMAL LOW (ref 4.22–5.81)
RDW: 13.6 % (ref 11.5–15.5)
WBC: 9.9 10*3/uL (ref 4.0–10.5)
nRBC: 0 % (ref 0.0–0.2)

## 2020-01-30 LAB — PHOSPHORUS: Phosphorus: 3 mg/dL (ref 2.5–4.6)

## 2020-01-30 LAB — MRSA PCR SCREENING: MRSA by PCR: POSITIVE — AB

## 2020-01-30 LAB — HEPARIN LEVEL (UNFRACTIONATED): Heparin Unfractionated: 0.17 IU/mL — ABNORMAL LOW (ref 0.30–0.70)

## 2020-01-30 LAB — MAGNESIUM: Magnesium: 2.1 mg/dL (ref 1.7–2.4)

## 2020-01-30 LAB — LACTIC ACID, PLASMA
Lactic Acid, Venous: 2.6 mmol/L (ref 0.5–1.9)
Lactic Acid, Venous: 2.5 mmol/L (ref 0.5–1.9)

## 2020-01-30 LAB — TROPONIN I (HIGH SENSITIVITY)
Troponin I (High Sensitivity): 22634 ng/L (ref <18)
Troponin I (High Sensitivity): 15675 ng/L (ref <18)

## 2020-01-30 LAB — HIV ANTIBODY (ROUTINE TESTING W REFLEX): HIV Screen 4th Generation wRfx: NONREACTIVE

## 2020-01-30 LAB — TRIGLYCERIDES: Triglycerides: 115 mg/dL (ref <150)

## 2020-01-30 SURGERY — RIGHT/LEFT HEART CATH AND CORONARY ANGIOGRAPHY

## 2020-01-30 MED ORDER — HEPARIN (PORCINE) IN NACL 1000-0.9 UT/500ML-% IV SOLN
INTRAVENOUS | Status: DC | PRN
  Administered 2020-01-30 (×2): 500 mL

## 2020-01-30 MED ORDER — TICAGRELOR 90 MG PO TABS
ORAL_TABLET | ORAL | Status: DC | PRN
  Administered 2020-01-30: 180 mg

## 2020-01-30 MED ORDER — SODIUM CHLORIDE 0.9% FLUSH: 3.0000 mL | INTRAVENOUS | Status: DC | PRN

## 2020-01-30 MED ORDER — IOHEXOL 350 MG/ML SOLN
INTRAVENOUS | Status: AC
  Filled 2020-01-30: qty 1

## 2020-01-30 MED ORDER — ONDANSETRON HCL 4 MG/2ML IJ SOLN: 4.0000 mg | Freq: Four times a day (QID) | INTRAMUSCULAR | Status: DC | PRN

## 2020-01-30 MED ORDER — LIDOCAINE HCL (PF) 1 % IJ SOLN
INTRAMUSCULAR | Status: AC
  Filled 2020-01-30: qty 30

## 2020-01-30 MED ORDER — METHYLPREDNISOLONE SODIUM SUCC 40 MG IJ SOLR
40.0000 mg | Freq: Every day | INTRAMUSCULAR | Status: DC
  Administered 2020-01-30: 13:00:00 40 mg via INTRAVENOUS
  Filled 2020-01-30: qty 1

## 2020-01-30 MED ORDER — FUROSEMIDE 10 MG/ML IJ SOLN
40.0000 mg | Freq: Once | INTRAMUSCULAR | Status: AC
  Administered 2020-01-30: 22:00:00 40 mg via INTRAVENOUS
  Filled 2020-01-30: qty 4

## 2020-01-30 MED ORDER — VERAPAMIL HCL 2.5 MG/ML IV SOLN
INTRAVENOUS | Status: AC
  Filled 2020-01-30: qty 2

## 2020-01-30 MED ORDER — SODIUM CHLORIDE 0.9 % IV SOLN
500.0000 mg | INTRAVENOUS | Status: DC
  Filled 2020-01-30: qty 500

## 2020-01-30 MED ORDER — NOREPINEPHRINE 4 MG/250ML-% IV SOLN
2.0000 ug/min | INTRAVENOUS | Status: DC
  Administered 2020-01-30: 04:00:00 3 ug/min via INTRAVENOUS
  Filled 2020-01-30: qty 250

## 2020-01-30 MED ORDER — FUROSEMIDE 10 MG/ML IJ SOLN
INTRAMUSCULAR | Status: DC | PRN
  Administered 2020-01-30: 40 mg via INTRAVENOUS

## 2020-01-30 MED ORDER — NITROGLYCERIN 1 MG/10 ML FOR IR/CATH LAB
INTRA_ARTERIAL | Status: AC
  Filled 2020-01-30: qty 10

## 2020-01-30 MED ORDER — LABETALOL HCL 5 MG/ML IV SOLN: 10.0000 mg | INTRAVENOUS | Status: AC | PRN

## 2020-01-30 MED ORDER — PHENOBARBITAL 20 MG/5ML PO ELIX: 60.0000 mg | ORAL_SOLUTION | Freq: Once | ORAL | Status: DC

## 2020-01-30 MED ORDER — HYDRALAZINE HCL 20 MG/ML IJ SOLN: 10.0000 mg | INTRAMUSCULAR | Status: AC | PRN

## 2020-01-30 MED ORDER — HEPARIN (PORCINE) IN NACL 1000-0.9 UT/500ML-% IV SOLN
INTRAVENOUS | Status: AC
  Filled 2020-01-30: qty 1000

## 2020-01-30 MED ORDER — CHLORHEXIDINE GLUCONATE CLOTH 2 % EX PADS
6.0000 | MEDICATED_PAD | Freq: Every day | CUTANEOUS | Status: DC
  Administered 2020-02-01 – 2020-02-02 (×2): 6 via TOPICAL

## 2020-01-30 MED ORDER — IOHEXOL 350 MG/ML SOLN
INTRAVENOUS | Status: DC | PRN
  Administered 2020-01-30: 18:00:00 220 mL via INTRA_ARTERIAL

## 2020-01-30 MED ORDER — HEPARIN (PORCINE) 25000 UT/250ML-% IV SOLN: 1400.0000 [IU]/h | INTRAVENOUS | Status: DC

## 2020-01-30 MED ORDER — PHENOBARBITAL 20 MG/5ML PO ELIX
60.0000 mg | ORAL_SOLUTION | Freq: Once | ORAL | Status: AC
  Administered 2020-01-30: 22:00:00 60 mg
  Filled 2020-01-30: qty 15

## 2020-01-30 MED ORDER — LIDOCAINE HCL (PF) 1 % IJ SOLN
INTRAMUSCULAR | Status: DC | PRN
  Administered 2020-01-30 (×2): 2 mL via SUBCUTANEOUS
  Administered 2020-01-30: 5 mL via SUBCUTANEOUS

## 2020-01-30 MED ORDER — SODIUM CHLORIDE 0.9% FLUSH
3.0000 mL | Freq: Two times a day (BID) | INTRAVENOUS | Status: DC
  Administered 2020-01-30 – 2020-02-02 (×6): 3 mL via INTRAVENOUS

## 2020-01-30 MED ORDER — SODIUM CHLORIDE 0.9 % IV SOLN
2.0000 g | INTRAVENOUS | Status: AC
  Administered 2020-01-30 – 2020-02-02 (×4): 2 g via INTRAVENOUS
  Filled 2020-01-30 (×2): qty 2
  Filled 2020-01-30: qty 20
  Filled 2020-01-30: qty 2

## 2020-01-30 MED ORDER — HEPARIN (PORCINE) 25000 UT/250ML-% IV SOLN
2100.0000 [IU]/h | INTRAVENOUS | Status: DC
  Administered 2020-01-30: 03:00:00 1800 [IU]/h via INTRAVENOUS
  Filled 2020-01-30 (×2): qty 250

## 2020-01-30 MED ORDER — ASPIRIN 81 MG PO CHEW
81.0000 mg | CHEWABLE_TABLET | Freq: Every day | ORAL | Status: DC
  Administered 2020-01-31 – 2020-02-03 (×4): 81 mg via ORAL
  Filled 2020-01-30 (×5): qty 1

## 2020-01-30 MED ORDER — HEPARIN BOLUS VIA INFUSION
3000.0000 [IU] | Freq: Once | INTRAVENOUS | Status: AC
  Administered 2020-01-30: 12:00:00 3000 [IU] via INTRAVENOUS
  Filled 2020-01-30: qty 3000

## 2020-01-30 MED ORDER — DIPHENHYDRAMINE HCL 50 MG/ML IJ SOLN
INTRAMUSCULAR | Status: AC
  Filled 2020-01-30: qty 1

## 2020-01-30 MED ORDER — FUROSEMIDE 10 MG/ML IJ SOLN
INTRAMUSCULAR | Status: AC
  Filled 2020-01-30: qty 4

## 2020-01-30 MED ORDER — PERFLUTREN LIPID MICROSPHERE
1.0000 mL | INTRAVENOUS | Status: AC | PRN
  Administered 2020-01-30: 12:00:00 2 mL via INTRAVENOUS
  Filled 2020-01-30: qty 10

## 2020-01-30 MED ORDER — HEPARIN SODIUM (PORCINE) 1000 UNIT/ML IJ SOLN
INTRAMUSCULAR | Status: AC
  Filled 2020-01-30: qty 1

## 2020-01-30 MED ORDER — NITROGLYCERIN 1 MG/10 ML FOR IR/CATH LAB
INTRA_ARTERIAL | Status: DC | PRN
  Administered 2020-01-30: 200 ug via INTRACORONARY

## 2020-01-30 MED ORDER — NOREPINEPHRINE 4 MG/250ML-% IV SOLN: 0.0000 ug/min | INTRAVENOUS | Status: DC

## 2020-01-30 MED ORDER — SODIUM CHLORIDE 0.9 % IV SOLN
INTRAVENOUS | Status: AC | PRN
  Administered 2020-01-30: 10 mL/h via INTRAVENOUS

## 2020-01-30 MED ORDER — ATORVASTATIN CALCIUM 80 MG PO TABS
80.0000 mg | ORAL_TABLET | Freq: Every day | ORAL | Status: DC
  Administered 2020-01-30: 22:00:00 80 mg
  Filled 2020-01-30: qty 1

## 2020-01-30 MED ORDER — BUPIVACAINE HCL (PF) 0.25 % IJ SOLN
INTRAMUSCULAR | Status: AC
  Filled 2020-01-30: qty 30

## 2020-01-30 MED ORDER — SODIUM CHLORIDE 0.9 % IV SOLN
250.0000 mL | INTRAVENOUS | Status: DC
  Administered 2020-01-30: 04:00:00 250 mL via INTRAVENOUS

## 2020-01-30 MED ORDER — SODIUM CHLORIDE 0.9 % IV SOLN: 250.0000 mL | INTRAVENOUS | Status: DC | PRN

## 2020-01-30 MED ORDER — TICAGRELOR 90 MG PO TABS
90.0000 mg | ORAL_TABLET | Freq: Two times a day (BID) | ORAL | Status: DC
  Administered 2020-01-31 – 2020-02-03 (×7): 90 mg via ORAL
  Filled 2020-01-30 (×8): qty 1

## 2020-01-30 MED ORDER — TICAGRELOR 90 MG PO TABS
ORAL_TABLET | ORAL | Status: AC
  Filled 2020-01-30: qty 2

## 2020-01-30 MED ORDER — HEPARIN SODIUM (PORCINE) 1000 UNIT/ML IJ SOLN
INTRAMUSCULAR | Status: DC | PRN
  Administered 2020-01-30: 3000 [IU] via INTRAVENOUS
  Administered 2020-01-30: 10000 [IU] via INTRAVENOUS
  Administered 2020-01-30: 3000 [IU] via INTRAVENOUS

## 2020-01-30 MED ORDER — DIPHENHYDRAMINE HCL 50 MG/ML IJ SOLN
INTRAMUSCULAR | Status: DC | PRN
  Administered 2020-01-30: 50 mg via INTRAVENOUS

## 2020-01-30 MED ORDER — HEPARIN BOLUS VIA INFUSION
3000.0000 [IU] | Freq: Once | INTRAVENOUS | Status: AC
  Administered 2020-01-30: 02:00:00 3000 [IU] via INTRAVENOUS
  Filled 2020-01-30: qty 3000

## 2020-01-30 MED ORDER — PHENOBARBITAL 32.4 MG PO TABS
64.8000 mg | ORAL_TABLET | Freq: Once | ORAL | Status: DC
  Filled 2020-01-30: qty 2

## 2020-01-30 MED ORDER — MUPIROCIN 2 % EX OINT
1.0000 "application " | TOPICAL_OINTMENT | Freq: Two times a day (BID) | CUTANEOUS | Status: AC
  Administered 2020-01-30 – 2020-02-03 (×10): 1 via NASAL
  Filled 2020-01-30 (×6): qty 22

## 2020-01-30 SURGICAL SUPPLY — 33 items
BALLN EMERGE MR 3.5X15 (BALLOONS) ×3
BALLN SAPPHIRE 2.5X12 (BALLOONS) ×3
BALLN SAPPHIRE ~~LOC~~ 3.75X15 (BALLOONS) ×3 IMPLANT
BALLOON EMERGE MR 3.5X15 (BALLOONS) ×1 IMPLANT
BALLOON SAPPHIRE 2.5X12 (BALLOONS) ×1 IMPLANT
CATH 5FR JL3.5 JR4 ANG PIG MP (CATHETERS) ×3 IMPLANT
CATH ACUITY  CS-ST 49CM 7080 (CATHETERS) ×2
CATH ACUITY CS-ST 49CM 7080 (CATHETERS) ×1 IMPLANT
CATH LAUNCHER 6FR EBU 3 (CATHETERS) ×3 IMPLANT
CATH LAUNCHER 6FR EBU3.5 (CATHETERS) ×3 IMPLANT
CATH SWAN GANZ VIP 7.5F (CATHETERS) ×3 IMPLANT
CATH VISTA GUIDE 6FR JR4 (CATHETERS) ×3 IMPLANT
DEVICE RAD COMP TR BAND LRG (VASCULAR PRODUCTS) ×3 IMPLANT
ELECT DEFIB PAD ADLT CADENCE (PAD) ×3 IMPLANT
GLIDESHEATH SLEND A-KIT 6F 22G (SHEATH) ×3 IMPLANT
GUIDEWIRE INQWIRE 1.5J.035X260 (WIRE) ×1 IMPLANT
INQWIRE 1.5J .035X260CM (WIRE) ×3
KIT ENCORE 26 ADVANTAGE (KITS) ×3 IMPLANT
KIT HEART LEFT (KITS) ×3 IMPLANT
KIT MICROPUNCTURE NIT STIFF (SHEATH) ×3 IMPLANT
PACK CARDIAC CATHETERIZATION (CUSTOM PROCEDURE TRAY) ×3 IMPLANT
SHEATH PINNACLE 6F 10CM (SHEATH) ×6 IMPLANT
SHEATH PINNACLE 8F 10CM (SHEATH) ×3 IMPLANT
SLEEVE REPOSITIONING LENGTH 30 (MISCELLANEOUS) ×3 IMPLANT
STENT RESOLUTE ONYX 3.5X18 (Permanent Stent) IMPLANT
STENT RESOLUTE ONYX 3.5X26 (Permanent Stent) ×3 IMPLANT
SYR MEDRAD MARK 7 150ML (SYRINGE) ×3 IMPLANT
TRANSDUCER W/STOPCOCK (MISCELLANEOUS) ×3 IMPLANT
TUBING CIL FLEX 10 FLL-RA (TUBING) ×3 IMPLANT
WIRE ASAHI PROWATER 180CM (WIRE) ×3 IMPLANT
WIRE COUGAR XT STRL 190CM (WIRE) ×3 IMPLANT
WIRE EMERALD 3MM-J .035X150CM (WIRE) ×3 IMPLANT
WIRE HI TORQ WHISPER MS 190CM (WIRE) ×3 IMPLANT

## 2020-01-30 NOTE — Progress Notes (Signed)
eLink Physician-Brief Progress Note Patient Name: Tommy Gaines DOB: September 03, 1965 MRN: [id number]   Date of Service  01/30/2020  HPI/Events of Note  Patient with  NSTEMI who came up to the floor with a Heparin infusion without orders in the chart for the Heparin infusion. She also has an order for Lovenox 40 mg sq Q HS.  eICU Interventions  Lovenox order discontinued. Heparin infusion ordered.        [name] 01/30/2020, 1:46 AM

## 2020-01-30 NOTE — Consult Note (Signed)
Interventional cardiology consult note:  54 year old Caucasian male with hypertension, hyperlipidemia, h/p PDA repair at age 42, tobacco, alcohol and polysubstance abuse, h/o prior strokes, transferred from [facility] on 01/29/2020 after admission there with acute respiratory failure.  Patient was evaluated by cardiologist on-call Dr. Algie Coffer who contacted me on 12/122021 around 1:30 PM.  I spoke with patient's mother Tommy Gaines, and fianc Tommy Gaines over the phone.  Briefly, patient has had progressive worsening dyspnea over the last couple of days, worse yesterday.  He is currently intubated and sedated, on 3 mics of norepinephrine with lactic acid elevated at 2.6.  He is on empiric treatment for possible pneumonia.  Work-up shows serial EKGs with anteroseptal MI, age indeterminate, escalating troponin up to 22,000, echocardiogram with EF 25-30% with global hypokinesis, worse in anterior anteroseptal wall.   Physical exam: Patient intubated, sedated S1-S2 norma B/l rhonchi No appreciable JVD. Bilateral 1+ pitting edema. Femoral artery 2+ bilaterally DP 2+, PT 1+ bilaterally  Assessment: ACS Cardiogenic shock:    High risk of mortality, close to 50% Acute systolic heart failure Possible pneumonia  Plan: Urgent coronary angiography and possible intervention, right heart catheterization, possible hemodynamic support placement.  I explained patient's condition, prognosis, and procedures planned to patient's mother Tommy Gaines, over the phone, gave verbal consent.  CRITICAL CARE Performed by: Truett Mainland   Total critical care time: 35 minutes   Critical care time was exclusive of separately billable procedures and treating other patients.   Critical care was necessary to treat or prevent imminent or life-threatening deterioration.   Critical care was time spent personally by me on the following activities: development of treatment plan with patient and/or surrogate as well  as nursing, discussions with consultants, evaluation of patient's response to treatment, examination of patient, obtaining history from patient or surrogate, ordering and performing treatments and interventions, ordering and review of laboratory studies, ordering and review of radiographic studies, pulse oximetry and re-evaluation of patient's condition.      Elder Negus, MD Pager: [phone] Office: [phone]

## 2020-01-30 NOTE — Interval H&P Note (Signed)
History and Physical Interval Note:  01/30/2020 3:18 PM  Tommy Gaines  has presented today for surgery, with the diagnosis of STEMI.  The various methods of treatment have been discussed with the patient and family. After consideration of risks, benefits and other options for treatment, the patient has consented to  Procedure(s): RIGHT HEART CATH (N/A) VENTRICULAR ASSIST DEVICE INSERTION (N/A) as a surgical intervention.  The patient's history has been reviewed, patient examined, no change in status, stable for surgery.  I have reviewed the patient's chart and labs.  Questions were answered to the patient's satisfaction.    2016 Appropriate Use Criteria for Coronary Revascularization in Patients With Acute Coronary Syndrome NSTEMI/UA Cardiogenic Shock NSTEMI/Unstable angina with cardiogenic shock Link Here: https://www.mendez-maxwell.com/ Indication:  Immediate revascularization by PCI or CABG of 1 or more arteries in a patient with NSTEMI or unstable angina with evidence of cardiogenic shock  A (9) Indication: 15; Score 9   Allysson Rinehimer J Danniel Tones

## 2020-01-30 NOTE — Progress Notes (Signed)
eLink Physician-Brief Progress Note Patient Name: Tommy Gaines DOB: 12/17/1965 MRN: [id number]   Date of Service  01/30/2020  HPI/Events of Note  OGT clogged and not functioning.   eICU Interventions  Plan: 1. Place new OGT.      Intervention Category Major Interventions: Other:  Lenell Antu 01/30/2020, 11:42 PM

## 2020-01-30 NOTE — Consult Note (Signed)
Referring Physician: Everardo All, MD  Tommy Gaines is an 54 y.o. male.                       Chief Complaint: Acute MI/CHF/COPD exacerbation  HPI: 54 years old male transferred form [facility] with COPD exacerbation, intubated, sedated and with NSTEMI. He has poor LV systolic function on recent echocardiogram. HS-Troponin I levels are elevated since yesterday with EKG evidence of anteroseptal wall MI. Patient has PMH of tobacco abuse, alcohol abuse, polysubstance abuse, hypertension, hyperlipidemia, depression and chronic pain. Repeat EKG shows evidence of septal MI, recent by history. HS-troponin is apparently still climbing from about 3K to 5K to 11 K to 22 K.  Past Medical History:  Diagnosis Date  . Arthritis   . COPD (chronic obstructive pulmonary disease) (HCC)   . Depression   . Diverticulitis   . Hyperlipidemia   . Hypertension   . Stroke [facility]) 2016   confirmed by CT scan       Past Surgical History:  Procedure Laterality Date  . intestinal rupture  2012   colostomy with take down as a result of this surgery.  colostomy reversed 6 months post surgery.  Marland Kitchen JOINT REPLACEMENT Bilateral    shoulders    Family History  Problem Relation Age of Onset  . Cancer Mother   . Diabetes Mother   . Heart disease Father    Social History:  reports that he has been smoking. He has quit using smokeless tobacco. He reports that he does not drink alcohol and does not use drugs.  Allergies:  Allergies  Allergen Reactions  . Acetaminophen Nausea And Vomiting  . Lidocaine Rash    Medications Prior to Admission  Medication Sig Dispense Refill  . albuterol (PROVENTIL HFA;VENTOLIN HFA) 108 (90 Base) MCG/ACT inhaler Inhale 2 puffs into the lungs 3 (three) times daily.    Marland Kitchen amLODipine (NORVASC) 5 MG tablet Take 5 mg by mouth daily.    Marland Kitchen aspirin EC 81 MG tablet Take 81 mg by mouth.    Marland Kitchen atorvastatin (LIPITOR) 10 MG tablet Take 10 mg by mouth daily.    . budesonide-formoterol  (SYMBICORT) 160-4.5 MCG/ACT inhaler Inhale 1 puff into the lungs 2 (two) times daily.    . furosemide (LASIX) 20 MG tablet Take 20 mg by mouth daily.    Marland Kitchen gabapentin (NEURONTIN) 300 MG capsule Take 300 mg by mouth 3 (three) times daily.    Marland Kitchen lisinopril (ZESTRIL) 5 MG tablet Take 5 mg by mouth daily.    Marland Kitchen omeprazole (PRILOSEC) 20 MG capsule Take 20 mg by mouth daily as needed (Acid reflux).    Marland Kitchen tiotropium (SPIRIVA) 18 MCG inhalation capsule Place 18 mcg into inhaler and inhale daily.    Marland Kitchen atorvastatin (LIPITOR) 10 MG tablet Take 10 mg by mouth daily. (Patient not taking: No sig reported)    . tiZANidine (ZANAFLEX) 4 MG tablet Take 1 tablet (4 mg total) by mouth every 8 (eight) hours as needed for muscle spasms. (Patient not taking: No sig reported) 90 tablet 3    Results for orders placed or performed during the hospital encounter of 01/29/20 (from the past 48 hour(s))  Glucose, capillary     Status: Abnormal   Collection Time: 01/29/20 10:43 PM  Result Value Ref Range   Glucose-Capillary 165 (H) 70 - 99 mg/dL    Comment: Glucose reference range applies only to samples taken after fasting for at least 8 hours.  MRSA  PCR Screening     Status: Abnormal   Collection Time: 01/29/20 10:48 PM   Specimen: Nasal Mucosa; Nasopharyngeal  Result Value Ref Range   MRSA by PCR POSITIVE (A) NEGATIVE    Comment:        The GeneXpert MRSA Assay (FDA approved for NASAL specimens only), is one component of a comprehensive MRSA colonization surveillance program. It is not intended to diagnose MRSA infection nor to guide or monitor treatment for MRSA infections. RESULT CALLED TO, READ BACK BY AND VERIFIED WITH: HIGH,M RN 01/30/2020 AT 0100 SKEEN,P Performed at [facility], [address] [place], [place] [address]   I-STAT 7, (LYTES, BLD GAS, ICA, H+H)     Status: Abnormal   Collection Time: 01/30/20 12:07 AM  Result Value Ref Range   pH, Arterial 7.298 (L) 7.350 - 7.450   pCO2 arterial  56.9 (H) 32.0 - 48.0 mmHg   pO2, Arterial 112 (H) 83.0 - 108.0 mmHg   Bicarbonate 27.9 20.0 - 28.0 mmol/L   TCO2 30 22 - 32 mmol/L   O2 Saturation 98.0 %   Acid-Base Excess 0.0 0.0 - 2.0 mmol/L   Sodium 135 135 - 145 mmol/L   Potassium 4.7 3.5 - 5.1 mmol/L   Calcium, Ion 1.15 1.15 - 1.40 mmol/L   HCT 36.0 (L) 39.0 - 52.0 %   Hemoglobin 12.2 (L) 13.0 - 17.0 g/dL   Patient temperature 83.1 F    Collection site Radial    Drawn by HIDE    Sample type ARTERIAL   CBC     Status: Abnormal   Collection Time: 01/30/20 12:33 AM  Result Value Ref Range   WBC 9.9 4.0 - 10.5 K/uL   RBC 3.74 (L) 4.22 - 5.81 MIL/uL   Hemoglobin 12.2 (L) 13.0 - 17.0 g/dL   HCT 51.7 (L) 61.6 - 07.3 %   MCV 100.5 (H) 80.0 - 100.0 fL   MCH 32.6 26.0 - 34.0 pg   MCHC 32.4 30.0 - 36.0 g/dL   RDW 71.0 62.6 - 94.8 %   Platelets 267 150 - 400 K/uL   nRBC 0.0 0.0 - 0.2 %    Comment: Performed at [facility], [address] [place], [place] [address]  Basic metabolic panel     Status: Abnormal   Collection Time: 01/30/20 12:33 AM  Result Value Ref Range   Sodium 134 (L) 135 - 145 mmol/L   Potassium 4.7 3.5 - 5.1 mmol/L   Chloride 99 98 - 111 mmol/L   CO2 24 22 - 32 mmol/L   Glucose, Bld 176 (H) 70 - 99 mg/dL    Comment: Glucose reference range applies only to samples taken after fasting for at least 8 hours.   BUN 15 6 - 20 mg/dL   Creatinine, Ser 0.35 (H) 0.61 - 1.24 mg/dL   Calcium 8.2 (L) 8.9 - 10.3 mg/dL   GFR, Estimated >00 >93 mL/min    Comment: (NOTE) Calculated using the CKD-EPI Creatinine Equation (2021)    Anion gap 11 5 - 15    Comment: Performed at [facility], [address], [place], [place] [address]  Magnesium     Status: None   Collection Time: 01/30/20 12:33 AM  Result Value Ref Range   Magnesium 2.1 1.7 - 2.4 mg/dL    Comment: Performed at [facility], [address] [place], [place] [address]  Phosphorus     Status: None   Collection Time:  01/30/20 12:33 AM   Result Value Ref Range   Phosphorus 3.0 2.5 - 4.6 mg/dL    Comment: Performed at [facility], [address] [place], [place] [address]  Triglycerides     Status: None   Collection Time: 01/30/20 12:33 AM  Result Value Ref Range   Triglycerides 115 <150 mg/dL    Comment: Performed at [facility], [address] [place], [place] [address]  Glucose, capillary     Status: Abnormal   Collection Time: 01/30/20  3:28 AM  Result Value Ref Range   Glucose-Capillary 174 (H) 70 - 99 mg/dL    Comment: Glucose reference range applies only to samples taken after fasting for at least 8 hours.  Glucose, capillary     Status: Abnormal   Collection Time: 01/30/20  7:43 AM  Result Value Ref Range   Glucose-Capillary 139 (H) 70 - 99 mg/dL    Comment: Glucose reference range applies only to samples taken after fasting for at least 8 hours.  Heparin level (unfractionated)     Status: Abnormal   Collection Time: 01/30/20  8:05 AM  Result Value Ref Range   Heparin Unfractionated 0.17 (L) 0.30 - 0.70 IU/mL    Comment: (NOTE) If heparin results are below expected values, and patient dosage has  been confirmed, suggest follow up testing of antithrombin III levels. Performed at [facility], [address], [place], [place] [address]   Glucose, capillary     Status: Abnormal   Collection Time: 01/30/20 12:24 PM  Result Value Ref Range   Glucose-Capillary 142 (H) 70 - 99 mg/dL    Comment: Glucose reference range applies only to samples taken after fasting for at least 8 hours.   CT Head Wo Contrast  Result Date: 01/29/2020 CLINICAL DATA:  54 year old male with history of altered mental status. Evaluate for intracranial hemorrhage. EXAM: CT HEAD WITHOUT CONTRAST TECHNIQUE: Contiguous axial images were obtained from the base of the skull through the vertex without intravenous contrast. COMPARISON:  None. FINDINGS: Brain: Well-defined areas of low attenuation in the basal ganglia  bilaterally, compatible with old lacunar infarcts. Patchy and confluent areas of decreased attenuation are noted throughout the deep and periventricular white matter of the cerebral hemispheres bilaterally, compatible with chronic microvascular ischemic disease. Larger well-defined area of low attenuation in the right occipital lobe also compatible with an area of encephalomalacia from remote right occipital infarct. No evidence of acute infarction, hemorrhage, hydrocephalus, extra-axial collection or mass lesion/mass effect. Vascular: No hyperdense vessel or unexpected calcification. Skull: Normal. Negative for fracture or focal lesion. Sinuses/Orbits: No acute finding. Other: None. IMPRESSION: 1. No acute intracranial abnormalities. 2. Mild chronic microvascular ischemic changes in the cerebral white matter with multifocal bilateral old lacunar basal ganglia infarcts and old right occipital infarct, as detailed above. Electronically Signed   By: Trudie Reed M.D.   On: 01/29/2020 13:25   DG Chest Portable 1 View  Result Date: 01/29/2020 CLINICAL DATA:  Post intubation, shortness of breath EXAM: PORTABLE CHEST 1 VIEW COMPARISON:  04/04/2011 FINDINGS: Endotracheal tube 6.4 cm above the carina. NG tube enters the stomach with the tip not visualized. Monitor leads and defibrillator pad over the left chest. Heart is enlarged with diffuse bilateral interstitial opacities and peripheral Kerley B lines favored to be interstitial edema. CHF is favored. Streaky bibasilar opacities compatible with atelectasis. No large effusion or pneumothorax. IMPRESSION: Endotracheal tube 6.4 cm above the carina. Cardiomegaly with new interstitial edema pattern  and bibasilar atelectasis. Electronically Signed   By: Judie Petit.  Shick M.D.   On: 01/29/2020 11:10   DG Abd Portable 1 View  Result Date: 01/29/2020 CLINICAL DATA:  OG tube placement EXAM: PORTABLE ABDOMEN - 1 VIEW COMPARISON:  01/29/2020 FINDINGS: OG tube enters the  proximal stomach.  Mild gastric distension noted. Degenerative changes of the spine. Cardiomegaly evident with diffuse interstitial opacities/edema and basilar atelectasis. IMPRESSION: OG tube within the proximal stomach. Electronically Signed   By: Judie Petit.  Shick M.D.   On: 01/29/2020 11:11   ECHOCARDIOGRAM COMPLETE  Result Date: 01/30/2020    ECHOCARDIOGRAM REPORT   Patient Name:   Tommy Gaines Date of Exam: 01/30/2020 Medical Rec #:  [id number]       Height:       73.0 in Accession #:    [id number]      Weight:       332.5 lb Date of Birth:  Mar 26, 1965       BSA:          2.670 m Patient Age:    54 years        BP:           128/79 mmHg Patient Gender: M               HR:           87 bpm. Exam Location:  Inpatient Procedure: 2D Echo, Cardiac Doppler, Color Doppler and Intracardiac            Opacification Agent Indications:    CHF  History:        Patient has no prior history of Echocardiogram examinations.                 CHF, Acute MI, COPD and Stroke; Risk Factors:Hypertension and                 Dyslipidemia. Severe alcohol abuse.  Sonographer:    Lavenia Atlas Referring Phys: [id number] CHRISTOPHER R DOROTHY  Sonographer Comments: Patient is morbidly obese and echo performed with patient supine and on artificial respirator. Image acquisition challenging due to patient body habitus. IMPRESSIONS  1. Severe left ventricular systolic dysfunction with moderate dilatation and global hypokinesis, there is no definite apical thrombus on the Definity echocontrast images. Grade 2 diastolic dysfunction with evidence for elevated filling pressures. RVEF is mildly decreased.  2. Left ventricular ejection fraction, by estimation, is 25 to 30%. The left ventricle has severely decreased function. The left ventricle demonstrates global hypokinesis. The left ventricular internal cavity size was moderately dilated. There is mild concentric left ventricular hypertrophy. Left ventricular diastolic parameters are consistent  with Grade II diastolic dysfunction (pseudonormalization). Elevated left atrial pressure.  3. Right ventricular systolic function is mildly reduced. The right ventricular size is mildly enlarged. There is normal pulmonary artery systolic pressure.  4. Left atrial size was moderately dilated.  5. Right atrial size was mildly dilated.  6. The mitral valve is normal in structure. Mild mitral valve regurgitation. No evidence of mitral stenosis.  7. The aortic valve is normal in structure. Aortic valve regurgitation is not visualized. No aortic stenosis is present.  8. The inferior vena cava is dilated in size with <50% respiratory variability, suggesting right atrial pressure of 15 mmHg. FINDINGS  Left Ventricle: Left ventricular ejection fraction, by estimation, is 25 to 30%. The left ventricle has severely decreased function. The left ventricle demonstrates global hypokinesis. Definity contrast agent was given IV to delineate the left ventricular  endocardial borders. The left ventricular internal cavity size was moderately dilated. There is mild concentric left ventricular hypertrophy. Left ventricular diastolic parameters are consistent with Grade II diastolic dysfunction (pseudonormalization). Elevated left atrial pressure. Right Ventricle: The right ventricular size is mildly enlarged. No increase in right ventricular wall thickness. Right ventricular systolic function is mildly reduced. There is normal pulmonary artery systolic pressure. Left Atrium: Left atrial size was moderately dilated. Right Atrium: Right atrial size was mildly dilated. Pericardium: There is no evidence of pericardial effusion. Mitral Valve: The mitral valve is normal in structure. Mild mitral valve regurgitation. No evidence of mitral valve stenosis. Tricuspid Valve: The tricuspid valve is normal in structure. Tricuspid valve regurgitation is trivial. No evidence of tricuspid stenosis. Aortic Valve: The aortic valve is normal in structure.  Aortic valve regurgitation is not visualized. No aortic stenosis is present. Pulmonic Valve: The pulmonic valve was normal in structure. Pulmonic valve regurgitation is not visualized. No evidence of pulmonic stenosis. Aorta: The aortic root is normal in size and structure. Venous: The inferior vena cava is dilated in size with less than 50% respiratory variability, suggesting right atrial pressure of 15 mmHg. IAS/Shunts: No atrial level shunt detected by color flow Doppler.  LEFT VENTRICLE PLAX 2D LVIDd:         6.30 cm  Diastology LVIDs:         5.40 cm  LV e' medial:    6.20 cm/s LV PW:         1.20 cm  LV E/e' medial:  12.8 LV IVS:        1.20 cm  LV e' lateral:   6.74 cm/s LVOT diam:     2.20 cm  LV E/e' lateral: 11.8 LV SV:         54 LV SV Index:   20 LVOT Area:     3.80 cm  RIGHT VENTRICLE RV Basal diam:  3.50 cm RV S prime:     13.10 cm/s TAPSE (M-mode): 3.3 cm LEFT ATRIUM             Index       RIGHT ATRIUM           Index LA diam:        3.60 cm 1.35 cm/m  RA Area:     16.50 cm LA Vol (A2C):   77.8 ml 29.13 ml/m RA Volume:   44.10 ml  16.51 ml/m LA Vol (A4C):   88.4 ml 33.10 ml/m LA Biplane Vol: 87.6 ml 32.80 ml/m  AORTIC VALVE LVOT Vmax:   77.00 cm/s LVOT Vmean:  49.500 cm/s LVOT VTI:    0.142 m  AORTA Ao Root diam: 3.20 cm MITRAL VALVE MV Area (PHT): 4.80 cm    SHUNTS MV Decel Time: 158 msec    Systemic VTI:  0.14 m MV E velocity: 79.30 cm/s  Systemic Diam: 2.20 cm MV A velocity: 76.70 cm/s MV E/A ratio:  1.03 Tommy Alexander MD Electronically signed by Tommy Alexander MD Signature Date/Time: 01/30/2020/12:01:27 PM    Final     Review Of Systems Constitutional: No fever, chills, weight loss or gain. Eyes: No vision change, wears glasses. No discharge or pain. Ears: No hearing loss, No tinnitus. Respiratory: No asthma, COPD, pneumonias. No shortness of breath. No hemoptysis. Cardiovascular: No chest pain, palpitation, leg edema. Gastrointestinal: No nausea, vomiting, diarrhea,  constipation. No GI bleed. No hepatitis. Genitourinary: No dysuria, hematuria, kidney stone. No incontinance. Neurological: No headache, stroke, seizures.  Psychiatry: No  psych facility admission for anxiety, depression, suicide. No detox. Skin: No rash. Musculoskeletal: No joint pain, fibromyalgia. No neck pain, back pain. Lymphadenopathy: No lymphadenopathy. Hematology: No anemia or easy bruising.   Blood pressure (!) 122/109, pulse 96, temperature 98.8 F (37.1 C), temperature source Axillary, resp. rate (!) 30, height 6\' 1"  (1.854 m), weight (!) 150.8 kg, SpO2 97 %. Body mass index is 43.86 kg/m. General appearance: alert, cooperative, appears stated age and no distress Head: Normocephalic, atraumatic. Eyes: Brown/Blue/Hazel eyes, pink conjunctiva, corneas clear. PERRL, EOM's intact. Neck: No adenopathy, no carotid bruit, no JVD, supple, symmetrical, trachea midline and thyroid not enlarged. Resp: Clear to auscultation bilaterally. Cardio: Regular rate and rhythm, S1, S2 normal, II/VI systolic murmur, no click, rub or gallop GI: Soft, non-tender; bowel sounds normal; no organomegaly. Extremities: No edema, cyanosis or clubbing. Skin: Warm and dry.  Neurologic: Alert and oriented X 3, normal strength. Normal coordination and gait.  Assessment/Plan Acute anteroseptal wall MI Acute systolic left heart failure Dilated cardiomyopathy Acute respiratory failure with hypoxia Ventilator dependence Tobacco use disorder Alcohol use disorder Polysubstance use disorder Hypertension Hyperlipidemia  Agree with supportive care. Agree with IV heparin. Discuss care with interventional cardiology for urgent cardiac cath. Dr. Jacinto HalimGanji and Dr. Rosemary HolmsPatwardhan notified. Patient is high risk for interventions but intubated and sedated.  Time spent: Review of old records, Lab, x-rays, EKG, other cardiac tests, examination, discussion with patient/nurse/physicians over 70 minutes.  Ricki RodriguezAjay S Rogan Wigley,  MD  01/30/2020, 1:14 PM

## 2020-01-30 NOTE — Progress Notes (Signed)
[facility] arrived to transport patient to [facility].  It was discovered that there had been a transfer order in place earlier in the day and an ICU bed had opened up. Called [facility] and was connected to Dr. Nicole Cella, intensivist at [facility]. Gave signout report, EMTALA completed and patient transported in stable condition on ventilator.

## 2020-01-30 NOTE — Progress Notes (Signed)
CRITICAL VALUE ALERT  Critical Value: MRSA positive  Date & Time Notied:  01/30/20 0104  Provider Notified: Pola Corn  Orders Received/Actions taken: MRSA positive order set entered

## 2020-01-30 NOTE — Progress Notes (Signed)
Critical care attending progress note  54 year old man who presented with acute hypoxemic hypercarbic respiratory failure and shock initially thought to be due to COPD exacerbation.  Found to have had a large anteroseptal MI with decreased EF to 25% with global hypokinesis with regional wall motion abnormalities  Brought to Cath Lab for urgent coronary revascularization possible placement of mechanical cardiac support.  Underwent placement of DES to culprit 95% mid RCA lesion with TIMI-3 flow. Started on ASA and ticagrelor.  Heparin given intra procedurally  Given furosemide in Cath Lab.  On my examination on return to the ICU patient is morbidly obese intubated and sedated on propofol and fentanyl.  He is in sinus rhythm at 85 bpm.  He is hypertensivesystolic blood pressure 150.  Chest is clear to auscultation bilaterally.  Heart sounds are distant.  Abdomen is protuberant but soft.  There is a left femoral venous sheath still in place.  There is some erythema of the inner aspect of both thighs with some warmth.  Distal pulses are present and the patient is warm and well-perfused with good capillary refill.   Assessment:  Successful percutaneous revascularization of culprit RCA lesion with improvement in hemodynamics.  Patient no longer appears to be in cardiogenic shock.  Obtaining hemodynamic measurements now to assess for need for further diuresis Will repeat labs including ABG, oximetry, metabolic panel CBC  Goal be to optimize cardiac filling pressures and relieve pulmonary edema overnight prior to SBT in a.m.  No evidence of to support COPD exacerbation.  Patient was rather in pulmonary edema Will stop steroids, simplify antibiotics to ceftriaxone alone.  History of alcohol abuse with risk of alcohol withdrawal.  This would complicate his recovery from myocardial infarction.  Preemptive phenobarbital load.    CRITICAL CARE Performed by: Lynnell Catalan   Total critical care  time: 40 minutes of additional time.  Critical care time was exclusive of separately billable procedures and treating other patients.  Critical care was necessary to treat or prevent imminent or life-threatening deterioration.  Critical care was time spent personally by me on the following activities: development of treatment plan with patient and/or surrogate as well as nursing, discussions with consultants, evaluation of patient's response to treatment, examination of patient, obtaining history from patient or surrogate, ordering and performing treatments and interventions, ordering and review of laboratory studies, ordering and review of radiographic studies, pulse oximetry, re-evaluation of patient's condition and participation in multidisciplinary rounds.  Lynnell Catalan, MD [facility] ICU Physician [facility]  Pager: [phone] Mobile: [phone] After hours: [phone].

## 2020-01-30 NOTE — Progress Notes (Signed)
Pt transported safely to Cath Lab 3 with Marisue Ivan, RRT.

## 2020-01-30 NOTE — Progress Notes (Addendum)
ANTICOAGULATION CONSULT NOTE  Pharmacy Consult for heparin Indication: chest pain/ACS  Allergies  Allergen Reactions  . Acetaminophen Nausea And Vomiting  . Lidocaine Rash    Patient Measurements:    Height 6' 1.5" from Care Everywhere (pt unable to provide) Heparin Dosing Weight: 114.5 kg  Vital Signs: Temp: 98.5 F (36.9 C) (12/11 2300) Temp Source: Axillary (12/11 2300) BP: 104/73 (12/12 0100) Pulse Rate: 92 (12/12 0100)  Labs: Recent Labs    01/29/20 1041 01/29/20 1216 01/29/20 2021 01/29/20 2115 01/30/20 0007 01/30/20 0033  HGB 14.2  --   --   --  12.2* 12.2*  HCT 43.3  --   --   --  36.0* 37.6*  PLT 450*  --   --   --   --  267  APTT 32  --   --   --   --   --   LABPROT 13.9  --   --   --   --   --   INR 1.1  --   --   --   --   --   HEPARINUNFRC  --   --   --  0.14*  --   --   CREATININE 1.24  --   --   --   --  1.37*  TROPONINIHS 3,145* 11,188*  3,557*  5,177* 13,150*  --   --   --     Estimated Creatinine Clearance: 89.2 mL/min (A) (by C-G formula based on SCr of 1.37 mg/dL (H)).   Assessment: 54 yo male here with respiratory distress starting on heparin drip for ACS; troponin 3145. No oral anticoagulants noted on PTA med list.   Heparin level 0.14 (at [facility] prior to transfer) - subtherapeutic on 1400 units/hr. No interruptions/issues noted with heparin gtt - was continued on transfer. No bleeding noted.   Goal of Therapy:  Heparin level 0.3-0.7 units/ml Monitor platelets by anticoagulation protocol: Yes   Plan:  Rebolus Heparin IV 3000 units Increase heparin gtt to 1800 units/hr Will f/u heparin level in 6 hours Daily heparin level and CBC  Christoper Fabian, PharmD, BCPS Please see amion for complete clinical pharmacist phone list 01/30/2020,1:46 AM

## 2020-01-30 NOTE — H&P (Signed)
NAME:  Tommy [facility], MRN:  [id number], DOB:  [birth year], LOS: 1 ADMISSION DATE:  01/29/2020, CONSULTATION DATE: 01/30/20  REFERRING MD:  Tommy Files, MD CHIEF COMPLAINT:  AHRF requiring MV  Brief History   54 year old male transferred from [facility] who presented with respiratory distress and intubated in ED. Transferred to [facility] for acute hypoxemic and hypercapneic failure secondary to COPD exacerbation due to pneumonia.  History of present illness   Tommy Gaines is a 54 year old male with tobacco abuse, alcohol abuse, polysubstance abuse, COPD (FEV1 58% mMRC 4 in 02/2019), HTN, HLD, depression and chronic pain who presented via EMS on CPAP in respiratory distress. In [facility] ED he was intubated for acute hypoxemic respiratory failure.   [place] labs reviewed. CBC and CMP appropriate. BNP 377. Covid and influenza negative. ABG 7.13/86/70. UDS + amphetamines and cannabinoid. Troponin elevated 3145>5177>6819>11188>13150  CXR 12/11 with diffuse bilateral interstitial opacities. ETT 6cm above carina. CT head no acute intracranial abnormalities  On evaluation by CCM team, depressed EF noted on bedside echo with apical hypokinesis.  Past Medical History  Tobacco abuse, alcohol abuse, polysubstance abuse, COPD (FEV1 58% mMRC 4 in 02/2019), HTN, HLD, depression and chronic pain   Significant [facility] Events   12/11 Admitted and intubated 12/12 Transferred to [facility] ICU  Consults:  PCCM  Procedures:  ETT 12/11>  Significant Diagnostic Tests:    Micro Data:   BCx 12/11>  Antimicrobials:  Vanc 12/11 Cefepime 12/11>12/12  Interim history/subjective:  Intubated and sedated  Objective   Blood pressure 128/79, pulse 87, temperature 98.5 F (36.9 C), temperature source Axillary, resp. rate (!) 30, height 6\' 1"  (1.854 m), weight (!) 150.8 kg, SpO2 100 %.    Vent Mode: PRVC FiO2 (%):  [30 %-60 %] 30 % Set Rate:  [20 bmp-30 bmp] 30 bmp Vt Set:  [460 mL-560 mL] 560 mL PEEP:   [5 cmH20-12 cmH20] 5 cmH20 Plateau Pressure:  [22 cmH20-28 cmH20] 25 cmH20   Intake/Output Summary (Last 24 hours) at 01/30/2020 1114 Last data filed at 01/30/2020 0900 Gross per 24 hour  Intake 1150.08 ml  Output 1950 ml  Net -799.92 ml   Filed Weights   01/30/20 0500 01/30/20 0800  Weight: (!) 150.8 kg (!) 150.8 kg    Physical Exam: General: Well-appearing, sedated HENT: [facility], AT, ETT in place Eyes: EOMI, no scleral icterus Respiratory: Anterior rhonchi present, scattered wheezing, diminished air entry Cardiovascular: RRR, -M/R/G, no JVD GI: BS+, soft, nontender Extremities:-Edema,-tenderness Neuro: Sedated, grimaces to noxious stimuli, no withdrawaly  Resolved [facility] Problem list     Assessment & Plan:   Acute hypoxemic and hypercarbic respiratory failure  Acute heart failure exacerbation COPD exacerbation Full vent support Steroids x 5 days De-escalate antibiotics to CAP coverage Diurese gently Echo ordered  Undifferentiated shock, likely cardiogenic with component of sepsis Continue levophed for MAP goal >65 Antibiotics as above  Acute toxic metabolic encephalopathy - CT head neg. UDS + amphetamines, cannabis Hx polysubstance and alcohol abuse - last drink 48 hours PTA Supportive care Will need CIWA post-extubation for possible EtOH withdrawal PAD protocol for RASS goal -1: Propofol  NSTEMI Trend trop 3145>5177>6819>11188>13150 Echocardiogram read pending Heparin gtt Consult Cardiology  AKI -normal Cr until this am Monitor UOP/Cr  Best practice (evaluated daily)   Diet: Tube feeds Pain/Anxiety/Delirium protocol (if indicated): Yes VAP protocol (if indicated): Yes DVT prophylaxis: Heparin gtt GI prophylaxis: PPI Glucose control: SSI Mobility: BR last date of multidisciplinary goals of care discussion -N/A Family  and staff present N/A Summary of discussion N/A Follow up goals of care discussion due Code Status: Full. Confirmed by  spouse Disposition: ICU  Labs   CBC: Recent Labs  Lab 01/29/20 1041 01/30/20 0007 01/30/20 0033  WBC 15.6*  --  9.9  NEUTROABS 11.1*  --   --   HGB 14.2 12.2* 12.2*  HCT 43.3 36.0* 37.6*  MCV 101.6*  --  100.5*  PLT 450*  --  267    Basic Metabolic Panel: Recent Labs  Lab 01/29/20 1041 01/29/20 2021 01/30/20 0007 01/30/20 0033  NA 133*  --  135 134*  K 5.1  --  4.7 4.7  CL 96*  --   --  99  CO2 24  --   --  24  GLUCOSE 270*  --   --  176*  BUN 14  --   --  15  CREATININE 1.24  --   --  1.37*  CALCIUM 8.8*  --   --  8.2*  MG 2.6* 2.3  --  2.1  PHOS  --   --   --  3.0   GFR: Estimated Creatinine Clearance: 92.2 mL/min (A) (by C-G formula based on SCr of 1.37 mg/dL (H)). Recent Labs  Lab 01/29/20 1041 01/29/20 1216 01/30/20 0033  WBC 15.6*  --  9.9  LATICACIDVEN  --  1.3  --     Liver Function Tests: Recent Labs  Lab 01/29/20 1041  AST 119*  ALT 60*  ALKPHOS 86  BILITOT 1.0  PROT 7.6  ALBUMIN 3.5   No results for input(s): LIPASE, AMYLASE in the last 168 hours. No results for input(s): AMMONIA in the last 168 hours.  ABG    Component Value Date/Time   PHART 7.298 (L) 01/30/2020 0007   PCO2ART 56.9 (H) 01/30/2020 0007   PO2ART 112 (H) 01/30/2020 0007   HCO3 27.9 01/30/2020 0007   TCO2 30 01/30/2020 0007   ACIDBASEDEF 1.3 01/29/2020 2021   O2SAT 98.0 01/30/2020 0007     Coagulation Profile: Recent Labs  Lab 01/29/20 1041  INR 1.1    Cardiac Enzymes: No results for input(s): CKTOTAL, CKMB, CKMBINDEX, TROPONINI in the last 168 hours.  HbA1C: No results found for: HGBA1C  CBG: Recent Labs  Lab 01/29/20 2243 01/30/20 0328 01/30/20 0743  GLUCAP 165* 174* 139*    Review of Systems:   Unable to obtain due to intubated status  Past Medical History  He,  has a past medical history of Arthritis, COPD (chronic obstructive pulmonary disease) (HCC), Depression, Diverticulitis, Hyperlipidemia, Hypertension, and Stroke (HCC) (2016).    Surgical History    Past Surgical History:  Procedure Laterality Date  . intestinal rupture  2012   colostomy with take down as a result of this Tommy.  colostomy reversed 6 months post Tommy.  Marland Kitchen JOINT REPLACEMENT Bilateral    shoulders     Social History   reports that he has been smoking. He has quit using smokeless tobacco. He reports that he does not drink alcohol and does not use drugs.   Family History   His family history includes Cancer in his mother; Diabetes in his mother; Heart disease in his father.   Allergies Allergies  Allergen Reactions  . Acetaminophen Nausea And Vomiting  . Lidocaine Rash     Home Medications  Prior to Admission medications   Medication Sig Start Date End Date Taking? Authorizing Provider  amLODipine (NORVASC) 5 MG tablet Take 5 mg by mouth daily.  09/18/15  Yes [provider]  aspirin EC 81 MG tablet Take 81 mg by mouth.   Yes [provider]  atorvastatin (LIPITOR) 10 MG tablet Take 10 mg by mouth daily. 09/30/16 01/30/20 Yes [provider]  budesonide-formoterol (SYMBICORT) 160-4.5 MCG/ACT inhaler Inhale 1 puff into the lungs 2 (two) times daily. 09/30/16 01/30/20 Yes [provider]  furosemide (LASIX) 20 MG tablet Take 20 mg by mouth daily as needed.  09/18/15  Yes [provider]  gabapentin (NEURONTIN) 300 MG capsule Take 2 capsules (600 mg total) by mouth 3 (three) times daily. 10/17/16 03/10/18 Yes Edward Jolly, MD  lisinopril (ZESTRIL) 5 MG tablet Take 5 mg by mouth daily. 11/09/18 01/30/20 Yes [provider]  albuterol (PROVENTIL HFA;VENTOLIN HFA) 108 (90 Base) MCG/ACT inhaler Inhale 2 puffs into the lungs 3 (three) times daily. 09/18/15   [provider]  atorvastatin (LIPITOR) 10 MG tablet Take 10 mg by mouth daily. 11/21/17   [provider]  omeprazole (PRILOSEC) 20 MG capsule Take 20 mg by mouth daily. 09/18/15   [provider]  tiotropium (SPIRIVA)  18 MCG inhalation capsule Place 18 mcg into inhaler and inhale daily. 09/18/15   [provider]  tiZANidine (ZANAFLEX) 4 MG tablet Take 1 tablet (4 mg total) by mouth every 8 (eight) hours as needed for muscle spasms. 01/05/18   Edward Jolly, MD     Critical care time: 45 min    The patient is critically ill with multiple organ systems failure and requires high complexity decision making for assessment and support, frequent evaluation and titration of therapies, application of advanced monitoring technologies and extensive interpretation of multiple databases.  Independent Critical Care Time: 45 Minutes.   Mechele Collin, M.D. [facility] 01/30/2020 11:14 AM   Please see Amion for pager number to reach on-call Pulmonary and Critical Care Team.

## 2020-01-30 NOTE — Progress Notes (Signed)
Called bedside as patient continues to have elevated peak pressures in the 50's.  Breath sounds auscultated-diminished with expiratory wheezes throughout.  Called respiratory to give scheduled DuoNeb early.  With RT bedside adjusted ventilator settings: PEEP decreased from 12-10 without impact. Attempted patient in pressure control, but the patient dropped his tidal volumes into the 200's.  Obtained a stat ABG: 7.25/ 62/ 152/ 27.2.  Adjusted ventilator settings: 50%/ PEEP 5/ 26/ 480. Obtained STAT Mag level: 2.3, will NOT administer Mag IV bolus. Discontinued LR IVF  Sedation increased and vecuronium administered IVP. Will obtain ABG in 1 hour, consider another dose of DuoNeb if peak pressures still elevated, and lung sounds still wheezes throughout.  Plan discussed with Kandee Keen, RT and April, care RN.   Cheryll Cockayne Rust-Chester, AGACNP-BC Acute Care Nurse Practitioner [facility]   [phone] / [phone] Please see Amion for pager details.

## 2020-01-30 NOTE — Progress Notes (Signed)
ANTICOAGULATION CONSULT NOTE  Pharmacy Consult for heparin Indication: chest pain/ACS  Allergies  Allergen Reactions  . Acetaminophen Nausea And Vomiting  . Lidocaine Rash    Patient Measurements: Height: 6\' 1"  (185.4 cm) Weight: (!) 150.8 kg (332 lb 7.3 oz) IBW/kg (Calculated) : 79.9  Height 6' 1.5" from Care Everywhere (pt unable to provide) Heparin Dosing Weight: 114.5 kg  Vital Signs: Temp: 98.5 F (36.9 C) (12/12 0746) Temp Source: Axillary (12/12 0746) BP: 128/79 (12/12 0900) Pulse Rate: 87 (12/12 0900)  Labs: Recent Labs    01/29/20 1041 01/29/20 1216 01/29/20 2021 01/29/20 2115 01/30/20 0007 01/30/20 0033 01/30/20 0805  HGB 14.2  --   --   --  12.2* 12.2*  --   HCT 43.3  --   --   --  36.0* 37.6*  --   PLT 450*  --   --   --   --  267  --   APTT 32  --   --   --   --   --   --   LABPROT 13.9  --   --   --   --   --   --   INR 1.1  --   --   --   --   --   --   HEPARINUNFRC  --   --   --  0.14*  --   --  0.17*  CREATININE 1.24  --   --   --   --  1.37*  --   TROPONINIHS 3,145* 11,188*  14/12/21*  5,177* 13,150*  --   --   --   --     Estimated Creatinine Clearance: 92.2 mL/min (A) (by C-G formula based on SCr of 1.37 mg/dL (H)).   Assessment: 54 yo male here with respiratory distress starting on heparin drip for ACS; troponin 3145. No oral anticoagulants noted on PTA med list.   Heparin level 0.17 - subtherapeutic on 1800 units/hr. No interruptions/issues noted  No bleeding noted.   Goal of Therapy:  Heparin level 0.3-0.7 units/ml Monitor platelets by anticoagulation protocol: Yes   Plan:  Rebolus Heparin IV 3000 units Increase heparin gtt to 2100 units/hr Will f/u heparin level in 6 hours Daily heparin level and CBC  57, PharmD, [facility] Clinical Pharmacist Please see AMION for all Pharmacists' Contact Phone Numbers 01/30/2020, 11:04 AM

## 2020-01-30 NOTE — Progress Notes (Signed)
eLink Physician-Brief Progress Note Patient Name: Tommy [facility] DOB: 04-07-1965 MRN: [id number]   Date of Service  01/30/2020  HPI/Events of Note  Patient transferred from [facility] to [facility] with a Norepinephrine infusion running without an order in the chart.  eICU Interventions  Norepinephrine infusion ordered.        [name] Astrid Vides 01/30/2020, 2:17 AM

## 2020-01-30 NOTE — Progress Notes (Signed)
eLink Physician-Brief Progress Note Patient Name: Tommy Gaines DOB: Mar 09, 1965 MRN: [id number]   Date of Service  01/30/2020  HPI/Events of Note  ABG reviewed, patient has a respiratory acidosis.  eICU Interventions  Respiratory rate ordered increased to 30.        [name] 01/30/2020, 12:33 AM

## 2020-01-30 NOTE — H&P (View-Only) (Signed)
Interventional cardiology consult note:  54-year-old Caucasian male with hypertension, hyperlipidemia, h/p PDA repair at age 3, tobacco, alcohol and polysubstance abuse, h/o prior strokes, transferred from [facility] on 01/29/2020 after admission there with acute respiratory failure.  Patient was evaluated by cardiologist on-call Dr. Kadakia who contacted me on 12/122021 around 1:30 PM.  I spoke with patient's mother Thelma, and fianc Debbie over the phone.  Briefly, patient has had progressive worsening dyspnea over the last couple of days, worse yesterday.  He is currently intubated and sedated, on 3 mics of norepinephrine with lactic acid elevated at 2.6.  He is on empiric treatment for possible pneumonia.  Work-up shows serial EKGs with anteroseptal MI, age indeterminate, escalating troponin up to 22,000, echocardiogram with EF 25-30% with global hypokinesis, worse in anterior anteroseptal wall.   Physical exam: Patient intubated, sedated S1-S2 norma B/l rhonchi No appreciable JVD. Bilateral 1+ pitting edema. Femoral artery 2+ bilaterally DP 2+, PT 1+ bilaterally  Assessment: ACS Cardiogenic shock:    High risk of mortality, close to 50% Acute systolic heart failure Possible pneumonia  Plan: Urgent coronary angiography and possible intervention, right heart catheterization, possible hemodynamic support placement.  I explained patient's condition, prognosis, and procedures planned to patient's mother Thelma, over the phone, gave verbal consent.  CRITICAL CARE Performed by: Nedra Mcinnis   Total critical care time: 35 minutes   Critical care time was exclusive of separately billable procedures and treating other patients.   Critical care was necessary to treat or prevent imminent or life-threatening deterioration.   Critical care was time spent personally by me on the following activities: development of treatment plan with patient and/or surrogate as well  as nursing, discussions with consultants, evaluation of patient's response to treatment, examination of patient, obtaining history from patient or surrogate, ordering and performing treatments and interventions, ordering and review of laboratory studies, ordering and review of radiographic studies, pulse oximetry and re-evaluation of patient's condition.      Juanice Warburton J Lanier Millon, MD Pager: [phone] Office: [phone]     

## 2020-01-30 NOTE — Progress Notes (Signed)
  Echocardiogram 2D Echocardiogram has been performed.  Tom[facility] 01/30/2020, 11:38 AM

## 2020-01-31 ENCOUNTER — Inpatient Hospital Stay (HOSPITAL_COMMUNITY): Payer: Medicaid Other

## 2020-01-31 ENCOUNTER — Encounter (HOSPITAL_COMMUNITY): Payer: Self-pay | Admitting: Cardiology

## 2020-01-31 DIAGNOSIS — Z7289 Other problems related to lifestyle: Secondary | ICD-10-CM

## 2020-01-31 DIAGNOSIS — G9340 Encephalopathy, unspecified: Secondary | ICD-10-CM

## 2020-01-31 DIAGNOSIS — Z978 Presence of other specified devices: Secondary | ICD-10-CM

## 2020-01-31 DIAGNOSIS — Z789 Other specified health status: Secondary | ICD-10-CM

## 2020-01-31 DIAGNOSIS — I214 Non-ST elevation (NSTEMI) myocardial infarction: Secondary | ICD-10-CM

## 2020-01-31 LAB — POCT ACTIVATED CLOTTING TIME
Activated Clotting Time: 231 seconds
Activated Clotting Time: 249 seconds
Activated Clotting Time: 470 seconds
Activated Clotting Time: 440 seconds

## 2020-01-31 LAB — POCT I-STAT EG7
Acid-Base Excess: 0 mmol/L (ref 0.0–2.0)
Acid-Base Excess: 1 mmol/L (ref 0.0–2.0)
Acid-Base Excess: 2 mmol/L (ref 0.0–2.0)
Acid-base deficit: 1 mmol/L (ref 0.0–2.0)
Bicarbonate: 26.8 mmol/L (ref 20.0–28.0)
Bicarbonate: 27.6 mmol/L (ref 20.0–28.0)
Bicarbonate: 27.5 mmol/L (ref 20.0–28.0)
Bicarbonate: 28.2 mmol/L — ABNORMAL HIGH (ref 20.0–28.0)
Calcium, Ion: 1.16 mmol/L (ref 1.15–1.40)
Calcium, Ion: 1.2 mmol/L (ref 1.15–1.40)
Calcium, Ion: 1.16 mmol/L (ref 1.15–1.40)
Calcium, Ion: 1.17 mmol/L (ref 1.15–1.40)
HCT: 36 % — ABNORMAL LOW (ref 39.0–52.0)
HCT: 36 % — ABNORMAL LOW (ref 39.0–52.0)
HCT: 34 % — ABNORMAL LOW (ref 39.0–52.0)
HCT: 34 % — ABNORMAL LOW (ref 39.0–52.0)
Hemoglobin: 12.2 g/dL — ABNORMAL LOW (ref 13.0–17.0)
Hemoglobin: 12.2 g/dL — ABNORMAL LOW (ref 13.0–17.0)
Hemoglobin: 11.6 g/dL — ABNORMAL LOW (ref 13.0–17.0)
Hemoglobin: 11.6 g/dL — ABNORMAL LOW (ref 13.0–17.0)
O2 Saturation: 77 %
O2 Saturation: 81 %
O2 Saturation: 81 %
O2 Saturation: 81 %
Potassium: 4.2 mmol/L (ref 3.5–5.1)
Potassium: 4.3 mmol/L (ref 3.5–5.1)
Potassium: 4.2 mmol/L (ref 3.5–5.1)
Potassium: 4.3 mmol/L (ref 3.5–5.1)
Sodium: 137 mmol/L (ref 135–145)
Sodium: 137 mmol/L (ref 135–145)
Sodium: 137 mmol/L (ref 135–145)
Sodium: 137 mmol/L (ref 135–145)
TCO2: 28 mmol/L (ref 22–32)
TCO2: 29 mmol/L (ref 22–32)
TCO2: 29 mmol/L (ref 22–32)
TCO2: 30 mmol/L (ref 22–32)
pCO2, Ven: 55.5 mmHg (ref 44.0–60.0)
pCO2, Ven: 55.9 mmHg (ref 44.0–60.0)
pCO2, Ven: 51.2 mmHg (ref 44.0–60.0)
pCO2, Ven: 51.7 mmHg (ref 44.0–60.0)
pH, Ven: 7.292 (ref 7.250–7.430)
pH, Ven: 7.302 (ref 7.250–7.430)
pH, Ven: 7.338 (ref 7.250–7.430)
pH, Ven: 7.344 (ref 7.250–7.430)
pO2, Ven: 47 mmHg — ABNORMAL HIGH (ref 32.0–45.0)
pO2, Ven: 51 mmHg — ABNORMAL HIGH (ref 32.0–45.0)
pO2, Ven: 48 mmHg — ABNORMAL HIGH (ref 32.0–45.0)
pO2, Ven: 48 mmHg — ABNORMAL HIGH (ref 32.0–45.0)

## 2020-01-31 LAB — BASIC METABOLIC PANEL
Anion gap: 11 (ref 5–15)
BUN: 18 mg/dL (ref 6–20)
CO2: 28 mmol/L (ref 22–32)
Calcium: 8.2 mg/dL — ABNORMAL LOW (ref 8.9–10.3)
Chloride: 98 mmol/L (ref 98–111)
Creatinine, Ser: 1.18 mg/dL (ref 0.61–1.24)
GFR, Estimated: >60 mL/min (ref >60)
Glucose, Bld: 128 mg/dL — ABNORMAL HIGH (ref 70–99)
Potassium: 3.7 mmol/L (ref 3.5–5.1)
Sodium: 137 mmol/L (ref 135–145)

## 2020-01-31 LAB — POCT I-STAT 7, (LYTES, BLD GAS, ICA,H+H)
Acid-Base Excess: 0 mmol/L (ref 0.0–2.0)
Bicarbonate: 26.8 mmol/L (ref 20.0–28.0)
Calcium, Ion: 1.09 mmol/L — ABNORMAL LOW (ref 1.15–1.40)
HCT: 35 % — ABNORMAL LOW (ref 39.0–52.0)
Hemoglobin: 11.9 g/dL — ABNORMAL LOW (ref 13.0–17.0)
O2 Saturation: 100 %
Potassium: 4.1 mmol/L (ref 3.5–5.1)
Sodium: 137 mmol/L (ref 135–145)
TCO2: 28 mmol/L (ref 22–32)
pCO2 arterial: 50 mmHg — ABNORMAL HIGH (ref 32.0–48.0)
pH, Arterial: 7.337 — ABNORMAL LOW (ref 7.350–7.450)
pO2, Arterial: 420 mmHg — ABNORMAL HIGH (ref 83.0–108.0)

## 2020-01-31 LAB — CBC
HCT: 34.6 % — ABNORMAL LOW (ref 39.0–52.0)
Hemoglobin: 11.6 g/dL — ABNORMAL LOW (ref 13.0–17.0)
MCH: 33.1 pg (ref 26.0–34.0)
MCHC: 33.5 g/dL (ref 30.0–36.0)
MCV: 98.9 fL (ref 80.0–100.0)
Platelets: 249 10*3/uL (ref 150–400)
RBC: 3.5 MIL/uL — ABNORMAL LOW (ref 4.22–5.81)
RDW: 13.8 % (ref 11.5–15.5)
WBC: 9.1 10*3/uL (ref 4.0–10.5)
nRBC: 0 % (ref 0.0–0.2)

## 2020-01-31 LAB — COMPREHENSIVE METABOLIC PANEL
ALT: 60 U/L — ABNORMAL HIGH (ref 0–44)
AST: 111 U/L — ABNORMAL HIGH (ref 15–41)
Albumin: 2.8 g/dL — ABNORMAL LOW (ref 3.5–5.0)
Alkaline Phosphatase: 53 U/L (ref 38–126)
Anion gap: 14 (ref 5–15)
BUN: 21 mg/dL — ABNORMAL HIGH (ref 6–20)
CO2: 29 mmol/L (ref 22–32)
Calcium: 8.2 mg/dL — ABNORMAL LOW (ref 8.9–10.3)
Chloride: 99 mmol/L (ref 98–111)
Creatinine, Ser: 1.09 mg/dL (ref 0.61–1.24)
GFR, Estimated: >60 mL/min (ref >60)
Glucose, Bld: 110 mg/dL — ABNORMAL HIGH (ref 70–99)
Potassium: 3.7 mmol/L (ref 3.5–5.1)
Sodium: 142 mmol/L (ref 135–145)
Total Bilirubin: 0.6 mg/dL (ref 0.3–1.2)
Total Protein: 6.4 g/dL — ABNORMAL LOW (ref 6.5–8.1)

## 2020-01-31 LAB — TRIGLYCERIDES: Triglycerides: 149 mg/dL (ref <150)

## 2020-01-31 LAB — HEMOGLOBIN A1C
Hgb A1c MFr Bld: 5.4 % (ref 4.8–5.6)
Mean Plasma Glucose: 108.28 mg/dL

## 2020-01-31 MED ORDER — SODIUM CHLORIDE 0.9 % IV SOLN: 250.0000 mL | INTRAVENOUS | Status: DC | PRN

## 2020-01-31 MED ORDER — SODIUM CHLORIDE 0.9% FLUSH
3.0000 mL | Freq: Two times a day (BID) | INTRAVENOUS | Status: DC
  Administered 2020-01-31: 22:00:00 3 mL via INTRAVENOUS

## 2020-01-31 MED ORDER — BUDESONIDE-FORMOTEROL FUMARATE 160-4.5 MCG/ACT IN AERO: 2.0000 | INHALATION_SPRAY | Freq: Two times a day (BID) | RESPIRATORY_TRACT | Status: DC

## 2020-01-31 MED ORDER — LOSARTAN POTASSIUM 25 MG PO TABS
25.0000 mg | ORAL_TABLET | Freq: Every day | ORAL | Status: DC
  Administered 2020-01-31 – 2020-02-02 (×3): 25 mg via ORAL
  Filled 2020-01-31 (×4): qty 1

## 2020-01-31 MED ORDER — ASPIRIN 81 MG PO CHEW
81.0000 mg | CHEWABLE_TABLET | ORAL | Status: AC
  Administered 2020-02-01: 07:00:00 81 mg via ORAL
  Filled 2020-01-31: qty 1

## 2020-01-31 MED ORDER — UMECLIDINIUM BROMIDE 62.5 MCG/INH IN AEPB
1.0000 | INHALATION_SPRAY | Freq: Every day | RESPIRATORY_TRACT | Status: DC
  Administered 2020-02-01 – 2020-02-03 (×3): 1 via RESPIRATORY_TRACT
  Filled 2020-01-31 (×2): qty 7

## 2020-01-31 MED ORDER — SODIUM CHLORIDE 0.9% FLUSH: 3.0000 mL | INTRAVENOUS | Status: DC | PRN

## 2020-01-31 MED ORDER — ATORVASTATIN CALCIUM 80 MG PO TABS
80.0000 mg | ORAL_TABLET | Freq: Every day | ORAL | Status: DC
  Administered 2020-02-01 – 2020-02-03 (×3): 80 mg via ORAL
  Filled 2020-01-31 (×5): qty 1

## 2020-01-31 MED ORDER — FOLIC ACID 1 MG PO TABS
1.0000 mg | ORAL_TABLET | Freq: Every day | ORAL | Status: DC
  Administered 2020-01-31 – 2020-02-03 (×4): 1 mg via ORAL
  Filled 2020-01-31 (×5): qty 1

## 2020-01-31 MED ORDER — ALBUTEROL SULFATE (2.5 MG/3ML) 0.083% IN NEBU
2.5000 mg | INHALATION_SOLUTION | Freq: Four times a day (QID) | RESPIRATORY_TRACT | Status: DC
  Administered 2020-01-31 – 2020-02-02 (×7): 2.5 mg via RESPIRATORY_TRACT
  Filled 2020-01-31 (×7): qty 3

## 2020-01-31 MED ORDER — FLUTICASONE FUROATE-VILANTEROL 200-25 MCG/INH IN AEPB
1.0000 | INHALATION_SPRAY | Freq: Every day | RESPIRATORY_TRACT | Status: DC
  Administered 2020-01-31: 14:00:00 1 via RESPIRATORY_TRACT
  Filled 2020-01-31: qty 28

## 2020-01-31 MED ORDER — FUROSEMIDE 10 MG/ML IJ SOLN
20.0000 mg | Freq: Once | INTRAMUSCULAR | Status: AC
  Administered 2020-01-31: 14:00:00 20 mg via INTRAVENOUS
  Filled 2020-01-31: qty 2

## 2020-01-31 MED ORDER — POTASSIUM CHLORIDE CRYS ER 10 MEQ PO TBCR
10.0000 meq | EXTENDED_RELEASE_TABLET | Freq: Three times a day (TID) | ORAL | Status: DC
  Administered 2020-01-31 (×2): 10 meq via ORAL
  Filled 2020-01-31 (×7): qty 1

## 2020-01-31 MED ORDER — CARVEDILOL 3.125 MG PO TABS
3.1250 mg | ORAL_TABLET | Freq: Two times a day (BID) | ORAL | Status: DC
  Administered 2020-01-31 – 2020-02-02 (×3): 3.125 mg via ORAL
  Filled 2020-01-31 (×5): qty 1

## 2020-01-31 MED ORDER — OXYCODONE HCL 5 MG PO TABS
5.0000 mg | ORAL_TABLET | Freq: Four times a day (QID) | ORAL | Status: DC | PRN
  Administered 2020-01-31 – 2020-02-03 (×7): 5 mg via ORAL
  Filled 2020-01-31 (×7): qty 1

## 2020-01-31 MED ORDER — POLYETHYLENE GLYCOL 3350 17 G PO PACK
17.0000 g | PACK | Freq: Every day | ORAL | Status: DC
  Administered 2020-02-03: 08:00:00 17 g via ORAL
  Filled 2020-01-31 (×3): qty 1

## 2020-01-31 MED ORDER — DOCUSATE SODIUM 100 MG PO CAPS
100.0000 mg | ORAL_CAPSULE | Freq: Two times a day (BID) | ORAL | Status: DC
  Administered 2020-01-31 – 2020-02-03 (×5): 100 mg via ORAL
  Filled 2020-01-31 (×6): qty 1

## 2020-01-31 MED ORDER — SODIUM CHLORIDE 0.9 % IV SOLN: INTRAVENOUS | Status: AC

## 2020-01-31 MED ORDER — THIAMINE HCL 100 MG PO TABS
100.0000 mg | ORAL_TABLET | Freq: Every day | ORAL | Status: DC
  Administered 2020-01-31 – 2020-02-03 (×4): 100 mg via ORAL
  Filled 2020-01-31 (×5): qty 1

## 2020-01-31 MED ORDER — FLUTICASONE FUROATE-VILANTEROL 200-25 MCG/INH IN AEPB
1.0000 | INHALATION_SPRAY | Freq: Every day | RESPIRATORY_TRACT | Status: DC
  Administered 2020-02-01 – 2020-02-03 (×3): 1 via RESPIRATORY_TRACT
  Filled 2020-01-31 (×2): qty 28

## 2020-01-31 MED ORDER — UMECLIDINIUM BROMIDE 62.5 MCG/INH IN AEPB
1.0000 | INHALATION_SPRAY | Freq: Every day | RESPIRATORY_TRACT | Status: DC
  Administered 2020-01-31: 14:00:00 1 via RESPIRATORY_TRACT
  Filled 2020-01-31: qty 7

## 2020-01-31 MED FILL — Verapamil HCl IV Soln 2.5 MG/ML: INTRAVENOUS | Qty: 4 | Status: AC

## 2020-01-31 MED FILL — Verapamil HCl IV Soln 2.5 MG/ML: INTRAVENOUS | Qty: 2 | Status: AC

## 2020-01-31 MED FILL — Bupivacaine HCl Preservative Free (PF) Inj 0.25%: INTRAMUSCULAR | Qty: 30 | Status: AC

## 2020-01-31 NOTE — Progress Notes (Signed)
NAME:  Harveer Sadler, MRN:  [id number], DOB:  1965-10-18, LOS: 2 ADMISSION DATE:  01/29/2020, CONSULTATION DATE: 01/30/20  REFERRING MD:  Terrilee Files, MD CHIEF COMPLAINT:  AHRF requiring MV  Brief History   54 year old male transferred from [place] who presented with respiratory distress and intubated in ED. Transferred to [facility] for acute hypoxemic and hypercapneic failure secondary to COPD exacerbation due to pneumonia.  Later found to have anteroseptal MI with decreased EF to 25%; therefore, taken for PCI with DES to mid RCA.  History of present illness   Mr. Jago Carton is a 54 year old male with tobacco abuse, alcohol abuse, polysubstance abuse, COPD (FEV1 58% mMRC 4 in 02/2019), HTN, HLD, depression and chronic pain who presented via EMS on CPAP in respiratory distress. In [facility] ED he was intubated for acute hypoxemic respiratory failure.   [place] labs reviewed. CBC and CMP appropriate. BNP 377. Covid and influenza negative. ABG 7.13/86/70. UDS + amphetamines and cannabinoid. Troponin elevated 3145>5177>6819>11188>13150  CXR 12/11 with diffuse bilateral interstitial opacities. ETT 6cm above carina. CT head no acute intracranial abnormalities  On evaluation by CCM team, depressed EF noted on bedside echo with apical hypokinesis.  He was found to have anteroseptal MI with decreased EF to 25%; therefore, taken for PCI with DES to mid RCA.  Per cards, he will need repeat cath and staged intervention to the LAD and possibly Cx.   Past Medical History  Tobacco abuse, alcohol abuse, polysubstance abuse, COPD (FEV1 58% mMRC 4 in 02/2019), HTN, HLD, depression and chronic pain   Significant Hospital Events   12/11 Admitted and intubated 12/12 Transferred to [facility] ICU 12/13 extubated  Consults:  PCCM  Procedures:  ETT 12/11 > 12/13.  Significant Diagnostic Tests:  PCI 12/12 > 100% mid LAC occlusion, prox 50% stenosis ramus, mid RCA 75% stenosis (culprit vessel).  S/p DES to mid  RCA.  Micro Data:  [place] BCx 12/11>  Antimicrobials:  Vanc 12/11 Cefepime 12/11>12/12  Interim history/subjective:  Agitated on vent.  On PSV 5/5 and tolerating well.  Objective   Blood pressure 119/79, pulse 82, temperature (!) 97.16 F (36.2 C), resp. rate (!) 30, height 6\' 1"  (1.854 m), weight (!) 145.9 kg, SpO2 98 %. PAP: (35-127)/(19-98) 39/21 CVP:  [6 mmHg-37 mmHg] 9 mmHg CO:  [8.5 L/min] 8.5 L/min CI:  [3.3 L/min/m2] 3.3 L/min/m2  Vent Mode: PRVC FiO2 (%):  [30 %-60 %] 40 % Set Rate:  [30 bmp] 30 bmp Vt Set:  [560 mL] 560 mL PEEP:  [5 cmH20] 5 cmH20 Plateau Pressure:  [19 cmH20-30 cmH20] 24 cmH20   Intake/Output Summary (Last 24 hours) at 01/31/2020 0905 Last data filed at 01/31/2020 0700 Gross per 24 hour  Intake 1093.23 ml  Output 6420 ml  Net -5326.77 ml   Filed Weights   01/30/20 0500 01/30/20 0800 01/31/20 0400  Weight: (!) 150.8 kg (!) 150.8 kg (!) 145.9 kg    Physical Exam: General: Well-appearing male, sedated HENT: [place], AT, ETT in place Eyes: EOMI, no scleral icterus Respiratory: CTAB Cardiovascular: RRR, -M/R/G, no JVD GI: BS+, soft, nontender Extremities:-Edema,-tenderness Neuro: Agitated on vent   Assessment & Plan:   Acute hypoxemic and hypercarbic respiratory failure - s/p intubation.  Tolerating SBT on PSV 5/5 well, follows commands, has cuff leak RSBI ~40-50 Acute heart failure exacerbation COPD without exacerbation Extubate today Ok to continue ceftriaxone x 5 days empirically Diurese gently Echo ordered  Cardiogenic shock - 2/2 anteroseptal MI.  Now resolved  after PCI with DES to mid RCA. Cards following, planning for repeat cath and staged intervention to LAD and possibly Cx. Continue ticagrelor, ASA  Acute toxic metabolic encephalopathy - CT head neg. UDS + amphetamines, cannabis Hx polysubstance and alcohol abuse - last drink 48 hours PTA.  S/p 60mg  phenobarb load 12/12. Supportive care Thiamine / Folate  AKI -normal Cr  until this am Monitor UOP/Cr   Will follow up after extubation.  If remains stable, PCCM will sign off  Best practice (evaluated daily)   Diet: RN to do bedside swallow post extubation Pain/Anxiety/Delirium protocol (if indicated): N/A. VAP protocol (if indicated): N/A DVT prophylaxis: SCDs GI prophylaxis: PPI Glucose control: SSI if glucose consistently >180 Mobility: BR last date of multidisciplinary goals of care discussion -N/A Family and staff present N/A Summary of discussion N/A Follow up goals of care discussion due: per primary Code Status: Full. Confirmed by spouse Disposition: ICU   Critical care time: 30 min    , Rutherford Guys [place] [facility] Medicine 01/31/2020, 9:25 AM

## 2020-01-31 NOTE — Progress Notes (Signed)
Ref: Parke Poisson, DO   Subjective:  Awake. Extubated. No chest pain. VS stable. Appears little anxious. He had excellent diuresis with clearing of pulmonary edema but PA pressure is high and wedge is normal. Monitor shows sinus tachycardia.  Objective:  Vital Signs in the last 24 hours: Temp:  [97.16 F (36.2 C)-99.14 F (37.3 C)] 98.78 F (37.1 C) (12/13 1300) Pulse Rate:  [0-136] 115 (12/13 1300) Cardiac Rhythm: Sinus tachycardia (12/13 1200) Resp:  [0-81] 23 (12/13 1300) BP: (97-168)/(67-118) 152/92 (12/13 1300) SpO2:  [0 %-100 %] 96 % (12/13 1300) FiO2 (%):  [30 %-60 %] 44 % (12/13 1113) Weight:  [145.9 kg] 145.9 kg (12/13 0400)  Physical Exam: BP Readings from Last 1 Encounters:  01/31/20 (!) 152/92     Wt Readings from Last 1 Encounters:  01/31/20 (!) 145.9 kg    Weight change: 0 kg Body mass index is 42.44 kg/m. HEENT: [place]/AT, Eyes-Blue, Conjunctiva-Pink, Sclera-Non-icteric Neck: No JVD, No bruit, Trachea midline. Lungs:  Clearing, Bilateral. Cardiac:  Regular rhythm, normal S1 and S2, no S3. II/VI systolic murmur. Abdomen:  Soft, non-tender. BS present. Extremities:  trace edema present. No cyanosis. No clubbing. CNS: AxOx3, Cranial nerves grossly intact, moves all 4 extremities.  Skin: Warm and dry.   Intake/Output from previous day: 12/12 0701 - 12/13 0700 In: 1429.8 [I.V.:1202.2; IV Piggyback:227.7] Out: 7070 [Urine:6770; Emesis/NG output:300]    Lab Results: BMET    Component Value Date/Time   NA 137 01/31/2020 0451   NA 137 01/30/2020 2036   NA 137 01/30/2020 2032   NA 140 04/04/2011 1047   K 3.7 01/31/2020 0451   K 4.0 01/30/2020 2036   K 4.1 01/30/2020 2032   K 4.1 04/04/2011 1047   CL 98 01/31/2020 0451   CL 100 01/30/2020 2036   CL 99 01/30/2020 0033   CL 106 04/04/2011 1047   CO2 28 01/31/2020 0451   CO2 26 01/30/2020 2036   CO2 24 01/30/2020 0033   CO2 28 04/04/2011 1047   GLUCOSE 128 (H) 01/31/2020 0451   GLUCOSE 160 (H)  01/30/2020 2036   GLUCOSE 176 (H) 01/30/2020 0033   GLUCOSE 105 (H) 04/04/2011 1047   BUN 18 01/31/2020 0451   BUN 17 01/30/2020 2036   BUN 15 01/30/2020 0033   BUN 10 04/04/2011 1047   CREATININE 1.18 01/31/2020 0451   CREATININE 1.21 01/30/2020 2036   CREATININE 1.37 (H) 01/30/2020 0033   CREATININE 1.13 04/04/2011 1047   CALCIUM 8.2 (L) 01/31/2020 0451   CALCIUM 8.1 (L) 01/30/2020 2036   CALCIUM 8.2 (L) 01/30/2020 0033   CALCIUM 8.7 04/04/2011 1047   GFRNONAA >60 01/31/2020 0451   GFRNONAA >60 01/30/2020 2036   GFRNONAA >60 01/30/2020 0033   GFRNONAA >60 04/04/2011 1047   GFRAA >60 04/04/2011 1047   CBC    Component Value Date/Time   WBC 9.1 01/31/2020 0451   RBC 3.50 (L) 01/31/2020 0451   HGB 11.6 (L) 01/31/2020 0451   HGB 14.4 04/04/2011 1047   HCT 34.6 (L) 01/31/2020 0451   HCT 42.3 04/04/2011 1047   PLT 249 01/31/2020 0451   PLT 236 04/04/2011 1047   MCV 98.9 01/31/2020 0451   MCV 99 04/04/2011 1047   MCH 33.1 01/31/2020 0451   MCHC 33.5 01/31/2020 0451   RDW 13.8 01/31/2020 0451   RDW 13.4 04/04/2011 1047   LYMPHSABS 3.0 01/29/2020 1041   MONOABS 1.2 (H) 01/29/2020 1041   EOSABS 0.1 01/29/2020 1041   BASOSABS 0.1 01/29/2020  1041   HEPATIC Function Panel Recent Labs    01/29/20 1041 01/30/20 2036  PROT 7.6 6.2*   HEMOGLOBIN A1C No components found for: HGA1C,  MPG CARDIAC ENZYMES Lab Results  Component Value Date   TROPONINI < 0.02 04/04/2011   BNP No results for input(s): PROBNP in the last 8760 hours. TSH No results for input(s): TSH in the last 8760 hours. CHOLESTEROL No results for input(s): CHOL in the last 8760 hours.  Scheduled Meds: . albuterol  2.5 mg Nebulization Q4H  . aspirin  81 mg Oral Daily  . [START ON 02/01/2020] atorvastatin  80 mg Oral Daily  . chlorhexidine gluconate (MEDLINE KIT)  15 mL Mouth Rinse BID  . Chlorhexidine Gluconate Cloth  6 each Topical Q0600  . docusate sodium  100 mg Oral BID  . [START ON 02/01/2020]  fluticasone furoate-vilanterol  1 puff Inhalation Daily  . folic acid  1 mg Oral Daily  . furosemide  20 mg Intravenous Once  . mupirocin ointment  1 application Nasal BID  . [START ON 02/01/2020] polyethylene glycol  17 g Oral Daily  . potassium chloride  10 mEq Oral TID  . sodium chloride flush  3 mL Intravenous Q12H  . thiamine  100 mg Oral Daily  . ticagrelor  90 mg Oral BID  . [START ON 02/01/2020] umeclidinium bromide  1 puff Inhalation Daily   Continuous Infusions: . sodium chloride    . cefTRIAXone (ROCEPHIN)  IV 2 g (01/31/20 1224)   PRN Meds:.sodium chloride, docusate sodium, ondansetron (ZOFRAN) IV, polyethylene glycol, sodium chloride flush  Assessment/Plan: Acute anteroseptal wall MI Acute systolic left heart failure, HFrEF Dilated cardiomyopathy Acute respiratory failure from above Tobacco use disorder Alcohol use disorder Hypertension Hyperlipidemia Hyperglycemia Moderate puylmonary systolic hypertension  Start small dose lasix, coreg, losartan to Entresto as he was on lisinopril before. Consider spironolactonealso if VS remain stable.    LOS: 2 days   Time spent including chart review, lab review, examination, discussion with patient/Nurse : 35 min   Dixie Dials  MD  01/31/2020, 1:26 PM

## 2020-01-31 NOTE — Progress Notes (Signed)
CARDIAC REHAB PHASE I   Began MI/stent education with pt. Pt given MI book along with stent card. Educated on importance of ASA, Brilinta, and NTG. Pt tearful, wanting to make it home for Christmas. Pt states his mom is a good support person for him. Provided support and encouragement. Currently waiting a plan. Will continue to follow and ambulate as able.   [id number] Reynold Bowen, RN BSN 01/31/2020 1:40 PM

## 2020-01-31 NOTE — Progress Notes (Signed)
I have reviewed the angiograms. He will need repeat cath and staged intervention to the LAD and possibly Cx. Plan tomorrow if labs and patient stable. Medical management per Dr. Algie Coffer.   Yates Decamp, MD, [facility] 01/31/2020, 8:40 AM Office: [phone] Pager: [phone]

## 2020-01-31 NOTE — Procedures (Signed)
Extubation Procedure Note  Patient Details:   Name: Mykai Wendorf DOB: 1965/04/05 MRN: [id number]   Airway Documentation:    Vent end date: 01/31/20 Vent end time: 0904   Evaluation  O2 sats: stable throughout Complications: No apparent complications Patient did tolerate procedure well. Bilateral Breath Sounds: Clear,Diminished   Yes, pt could speak post extubation.  Pt extubated to 5 l/m [place] per order.  Audrie Lia 01/31/2020, 9:07 AM

## 2020-02-01 ENCOUNTER — Encounter (HOSPITAL_COMMUNITY)
Admission: AD | Disposition: A | Discharge status: Home / Self Care | Payer: Self-pay | Source: Other Acute Inpatient Hospital | Attending: Cardiology

## 2020-02-01 ENCOUNTER — Encounter (HOSPITAL_COMMUNITY): Payer: Self-pay | Admitting: Cardiology

## 2020-02-01 HISTORY — PX: CORONARY STENT INTERVENTION: CATH118234

## 2020-02-01 LAB — CBC
HCT: 34.1 % — ABNORMAL LOW (ref 39.0–52.0)
Hemoglobin: 11.1 g/dL — ABNORMAL LOW (ref 13.0–17.0)
MCH: 33.7 pg (ref 26.0–34.0)
MCHC: 32.6 g/dL (ref 30.0–36.0)
MCV: 103.6 fL — ABNORMAL HIGH (ref 80.0–100.0)
Platelets: 221 10*3/uL (ref 150–400)
RBC: 3.29 MIL/uL — ABNORMAL LOW (ref 4.22–5.81)
RDW: 14.2 % (ref 11.5–15.5)
WBC: 10 10*3/uL (ref 4.0–10.5)
nRBC: 0 % (ref 0.0–0.2)

## 2020-02-01 LAB — MAGNESIUM: Magnesium: 1.9 mg/dL (ref 1.7–2.4)

## 2020-02-01 LAB — LIPID PANEL
Cholesterol: 127 mg/dL (ref 0–200)
HDL: 28 mg/dL — ABNORMAL LOW (ref >40)
LDL Cholesterol: 71 mg/dL (ref 0–99)
Total CHOL/HDL Ratio: 4.5 RATIO
Triglycerides: 140 mg/dL (ref <150)
VLDL: 28 mg/dL (ref 0–40)

## 2020-02-01 LAB — PHOSPHORUS: Phosphorus: 2.4 mg/dL — ABNORMAL LOW (ref 2.5–4.6)

## 2020-02-01 LAB — TRIGLYCERIDES: Triglycerides: 129 mg/dL (ref <150)

## 2020-02-01 LAB — POCT ACTIVATED CLOTTING TIME
Activated Clotting Time: 273 seconds
Activated Clotting Time: 238 seconds

## 2020-02-01 SURGERY — CORONARY STENT INTERVENTION
Anesthesia: LOCAL

## 2020-02-01 MED ORDER — VERAPAMIL HCL 2.5 MG/ML IV SOLN
INTRAVENOUS | Status: DC | PRN
  Administered 2020-02-01: 08:00:00 10 mL via INTRA_ARTERIAL

## 2020-02-01 MED ORDER — K PHOS MONO-SOD PHOS DI & MONO 155-852-130 MG PO TABS
500.0000 mg | ORAL_TABLET | Freq: Once | ORAL | Status: AC
  Administered 2020-02-01: 11:00:00 500 mg via ORAL
  Filled 2020-02-01: qty 2

## 2020-02-01 MED ORDER — IOHEXOL 350 MG/ML SOLN
INTRAVENOUS | Status: DC | PRN
  Administered 2020-02-01: 09:00:00 140 mL

## 2020-02-01 MED ORDER — VERAPAMIL HCL 2.5 MG/ML IV SOLN
INTRAVENOUS | Status: AC
  Filled 2020-02-01: qty 2

## 2020-02-01 MED ORDER — ACETAMINOPHEN 325 MG PO TABS: 650.0000 mg | ORAL_TABLET | ORAL | Status: DC | PRN

## 2020-02-01 MED ORDER — FENTANYL CITRATE (PF) 100 MCG/2ML IJ SOLN
INTRAMUSCULAR | Status: AC
  Filled 2020-02-01: qty 2

## 2020-02-01 MED ORDER — HEPARIN (PORCINE) IN NACL 1000-0.9 UT/500ML-% IV SOLN
INTRAVENOUS | Status: AC
  Filled 2020-02-01: qty 500

## 2020-02-01 MED ORDER — LIDOCAINE HCL (PF) 1 % IJ SOLN
INTRAMUSCULAR | Status: DC | PRN
  Administered 2020-02-01: 2 mL

## 2020-02-01 MED ORDER — HEPARIN SODIUM (PORCINE) 1000 UNIT/ML IJ SOLN
INTRAMUSCULAR | Status: DC | PRN
  Administered 2020-02-01: 3000 [IU] via INTRAVENOUS
  Administered 2020-02-01: 2000 [IU] via INTRAVENOUS
  Administered 2020-02-01: 10000 [IU] via INTRAVENOUS

## 2020-02-01 MED ORDER — SODIUM CHLORIDE 0.9 % IV SOLN
250.0000 mL | INTRAVENOUS | Status: DC | PRN
  Administered 2020-02-02: 17:00:00 250 mL via INTRAVENOUS

## 2020-02-01 MED ORDER — HEPARIN SODIUM (PORCINE) 1000 UNIT/ML IJ SOLN
INTRAMUSCULAR | Status: AC
  Filled 2020-02-01: qty 1

## 2020-02-01 MED ORDER — FENTANYL CITRATE (PF) 100 MCG/2ML IJ SOLN
INTRAMUSCULAR | Status: DC | PRN
  Administered 2020-02-01 (×2): 50 ug via INTRAVENOUS
  Administered 2020-02-01: 25 ug via INTRAVENOUS

## 2020-02-01 MED ORDER — LABETALOL HCL 5 MG/ML IV SOLN: 10.0000 mg | INTRAVENOUS | Status: AC | PRN

## 2020-02-01 MED ORDER — MIDAZOLAM HCL 2 MG/2ML IJ SOLN
INTRAMUSCULAR | Status: DC | PRN
  Administered 2020-02-01 (×2): 2 mg via INTRAVENOUS

## 2020-02-01 MED ORDER — SODIUM CHLORIDE 0.9 % IV SOLN: INTRAVENOUS | Status: AC

## 2020-02-01 MED ORDER — MIDAZOLAM HCL 2 MG/2ML IJ SOLN
INTRAMUSCULAR | Status: AC
  Filled 2020-02-01: qty 2

## 2020-02-01 MED ORDER — NITROGLYCERIN 1 MG/10 ML FOR IR/CATH LAB
INTRA_ARTERIAL | Status: DC | PRN
  Administered 2020-02-01: 200 ug via INTRACORONARY

## 2020-02-01 MED ORDER — NITROGLYCERIN 1 MG/10 ML FOR IR/CATH LAB
INTRA_ARTERIAL | Status: AC
  Filled 2020-02-01: qty 10

## 2020-02-01 MED ORDER — POTASSIUM CHLORIDE CRYS ER 20 MEQ PO TBCR
20.0000 meq | EXTENDED_RELEASE_TABLET | Freq: Once | ORAL | Status: AC
  Administered 2020-02-01: 10:00:00 20 meq via ORAL

## 2020-02-01 MED ORDER — SODIUM CHLORIDE 0.9% FLUSH: 3.0000 mL | INTRAVENOUS | Status: DC | PRN

## 2020-02-01 MED ORDER — SPIRONOLACTONE 25 MG PO TABS
25.0000 mg | ORAL_TABLET | Freq: Every day | ORAL | Status: DC
  Administered 2020-02-01 – 2020-02-03 (×3): 25 mg via ORAL
  Filled 2020-02-01 (×4): qty 1

## 2020-02-01 MED ORDER — SODIUM CHLORIDE 0.9% FLUSH
3.0000 mL | Freq: Two times a day (BID) | INTRAVENOUS | Status: DC
  Administered 2020-02-01 – 2020-02-03 (×3): 3 mL via INTRAVENOUS

## 2020-02-01 MED ORDER — HEPARIN (PORCINE) IN NACL 1000-0.9 UT/500ML-% IV SOLN
INTRAVENOUS | Status: DC | PRN
  Administered 2020-02-01 (×2): 500 mL

## 2020-02-01 SURGICAL SUPPLY — 20 items
BALLN SAPPHIRE 2.0X12 (BALLOONS) ×2
BALLOON SAPPHIRE 2.0X12 (BALLOONS) ×1 IMPLANT
CATH VISTA GUIDE 6FR XB3.5 (CATHETERS) ×2 IMPLANT
DEVICE RAD COMP TR BAND LRG (VASCULAR PRODUCTS) ×2 IMPLANT
GLIDESHEATH SLEND A-KIT 6F 22G (SHEATH) ×2 IMPLANT
GUIDEWIRE INQWIRE 1.5J.035X260 (WIRE) ×1 IMPLANT
INQWIRE 1.5J .035X260CM (WIRE) ×2
KIT ENCORE 26 ADVANTAGE (KITS) ×2 IMPLANT
KIT HEART LEFT (KITS) ×2 IMPLANT
MAT PREVALON FULL STRYKER (MISCELLANEOUS) ×2 IMPLANT
PACK CARDIAC CATHETERIZATION (CUSTOM PROCEDURE TRAY) ×2 IMPLANT
PAD ELECT DEFIB RADIOL ZOLL (MISCELLANEOUS) ×2 IMPLANT
STENT RESOLUTE ONYX 2.0X15 (Permanent Stent) ×2 IMPLANT
STENT RESOLUTE ONYX 2.0X30 (Permanent Stent) ×4 IMPLANT
STENT RESOLUTE ONYX 2.5X38 (Permanent Stent) ×2 IMPLANT
STENT RESOLUTE ONYX 2.5X8 (Permanent Stent) ×2 IMPLANT
TRANSDUCER W/STOPCOCK (MISCELLANEOUS) ×2 IMPLANT
TUBING CIL FLEX 10 FLL-RA (TUBING) ×2 IMPLANT
WIRE ASAHI PROWATER 180CM (WIRE) ×2 IMPLANT
WIRE COUGAR XT STRL 190CM (WIRE) ×2 IMPLANT

## 2020-02-01 NOTE — H&P (View-Only) (Signed)
Ref: Parke Poisson, DO   Subjective:  Feeling better. VS stable Mild respiratory distress continues. Resting comfortably. Discussed diet, activity and medications yesterday. Good diuresis with stable potassium and creatinine levels.  Objective:  Vital Signs in the last 24 hours: Temp:  [97.16 F (36.2 C)-99.14 F (37.3 C)] 98 F (36.7 C) (12/13 2359) Pulse Rate:  [71-136] 92 (12/14 0500) Cardiac Rhythm: Normal sinus rhythm (12/14 0400) Resp:  [16-30] 24 (12/14 0500) BP: (113-168)/(69-136) 144/85 (12/14 0500) SpO2:  [86 %-100 %] 95 % (12/14 0500) FiO2 (%):  [40 %-44 %] 44 % (12/13 1352)  Physical Exam: BP Readings from Last 1 Encounters:  02/01/20 (!) 144/85     Wt Readings from Last 1 Encounters:  01/31/20 (!) 145.9 kg    Weight change:  Body mass index is 42.44 kg/m. HEENT: [place]/AT, Eyes-Blue, Conjunctiva-Pink, Sclera-Non-icteric Neck: No JVD, No bruit, Trachea midline. Lungs:  Mild wheezing, Bilateral. Cardiac:  Regular rhythm, normal S1 and S2, no S3. II/VI systolic murmur. Abdomen:  Soft, non-tender. BS present. Extremities:  2 + edema present. No cyanosis. No clubbing. CNS: AxOx3, Cranial nerves grossly intact, moves all 4 extremities.  Skin: Warm and dry.   Intake/Output from previous day: 12/13 0701 - 12/14 0700 In: 705.3 [I.V.:605.3; IV Piggyback:100] Out: 2750 [Urine:2750]    Lab Results: BMET    Component Value Date/Time   NA 142 01/31/2020 1352   NA 137 01/31/2020 0451   NA 137 01/30/2020 2036   NA 140 04/04/2011 1047   K 3.7 01/31/2020 1352   K 3.7 01/31/2020 0451   K 4.0 01/30/2020 2036   K 4.1 04/04/2011 1047   CL 99 01/31/2020 1352   CL 98 01/31/2020 0451   CL 100 01/30/2020 2036   CL 106 04/04/2011 1047   CO2 29 01/31/2020 1352   CO2 28 01/31/2020 0451   CO2 26 01/30/2020 2036   CO2 28 04/04/2011 1047   GLUCOSE 110 (H) 01/31/2020 1352   GLUCOSE 128 (H) 01/31/2020 0451   GLUCOSE 160 (H) 01/30/2020 2036   GLUCOSE 105 (H) 04/04/2011 1047    BUN 21 (H) 01/31/2020 1352   BUN 18 01/31/2020 0451   BUN 17 01/30/2020 2036   BUN 10 04/04/2011 1047   CREATININE 1.09 01/31/2020 1352   CREATININE 1.18 01/31/2020 0451   CREATININE 1.21 01/30/2020 2036   CREATININE 1.13 04/04/2011 1047   CALCIUM 8.2 (L) 01/31/2020 1352   CALCIUM 8.2 (L) 01/31/2020 0451   CALCIUM 8.1 (L) 01/30/2020 2036   CALCIUM 8.7 04/04/2011 1047   GFRNONAA >60 01/31/2020 1352   GFRNONAA >60 01/31/2020 0451   GFRNONAA >60 01/30/2020 2036   GFRNONAA >60 04/04/2011 1047   GFRAA >60 04/04/2011 1047   CBC    Component Value Date/Time   WBC 10.0 02/01/2020 0421   RBC 3.29 (L) 02/01/2020 0421   HGB 11.1 (L) 02/01/2020 0421   HGB 14.4 04/04/2011 1047   HCT 34.1 (L) 02/01/2020 0421   HCT 42.3 04/04/2011 1047   PLT 221 02/01/2020 0421   PLT 236 04/04/2011 1047   MCV 103.6 (H) 02/01/2020 0421   MCV 99 04/04/2011 1047   MCH 33.7 02/01/2020 0421   MCHC 32.6 02/01/2020 0421   RDW 14.2 02/01/2020 0421   RDW 13.4 04/04/2011 1047   LYMPHSABS 3.0 01/29/2020 1041   MONOABS 1.2 (H) 01/29/2020 1041   EOSABS 0.1 01/29/2020 1041   BASOSABS 0.1 01/29/2020 1041   HEPATIC Function Panel Recent Labs    01/29/20 1041 01/30/20 2036  01/31/20 1352  PROT 7.6 6.2* 6.4*   HEMOGLOBIN A1C No components found for: HGA1C,  MPG CARDIAC ENZYMES Lab Results  Component Value Date   TROPONINI < 0.02 04/04/2011   BNP No results for input(s): PROBNP in the last 8760 hours. TSH No results for input(s): TSH in the last 8760 hours. CHOLESTEROL No results for input(s): CHOL in the last 8760 hours.  Scheduled Meds: . albuterol  2.5 mg Nebulization QID  . aspirin  81 mg Oral Daily  . aspirin  81 mg Oral Pre-Cath  . atorvastatin  80 mg Oral Daily  . carvedilol  3.125 mg Oral BID WC  . chlorhexidine gluconate (MEDLINE KIT)  15 mL Mouth Rinse BID  . Chlorhexidine Gluconate Cloth  6 each Topical Q0600  . docusate sodium  100 mg Oral BID  . fluticasone furoate-vilanterol  1  puff Inhalation Daily  . folic acid  1 mg Oral Daily  . losartan  25 mg Oral Daily  . mupirocin ointment  1 application Nasal BID  . polyethylene glycol  17 g Oral Daily  . potassium chloride  10 mEq Oral TID  . sodium chloride flush  3 mL Intravenous Q12H  . sodium chloride flush  3 mL Intravenous Q12H  . thiamine  100 mg Oral Daily  . ticagrelor  90 mg Oral BID  . umeclidinium bromide  1 puff Inhalation Daily   Continuous Infusions: . sodium chloride    . sodium chloride    . sodium chloride 50 mL/hr at 02/01/20 0500  . cefTRIAXone (ROCEPHIN)  IV Stopped (01/31/20 1254)   PRN Meds:.sodium chloride, sodium chloride, docusate sodium, ondansetron (ZOFRAN) IV, oxyCODONE, polyethylene glycol, sodium chloride flush, sodium chloride flush  Assessment/Plan: Acute anteroseptal wall MI Acute systolic left heart failure, HFrEF Dilated cardiomyopathy Acute respiratory failure from above Tobacco use disorder Alcohol ise disorder Hypertension Hyperlipidemia Hyperglycemia Moderate pulmonary systolic hypertension  Awaiting LAD intervention today Add spironolactone.    LOS: 3 days   Time spent including chart review, lab review, examination, discussion with patient/Nurse : 30 min   Dixie Dials  MD  02/01/2020, 6:32 AM

## 2020-02-01 NOTE — Progress Notes (Signed)
CARDIAC REHAB PHASE I   Pt drowsy from procedure this morning. Pt given additional stent cards along with HF booklet. Pt educated on importance of daily weights, monitoring symptoms, and medication compliance. Pt states importance of ASA and Brilinta. Will return to tomorrow to ambulate and further enforce education.  [id number] Reynold Bowen, RN BSN 02/01/2020 1:58 PM

## 2020-02-01 NOTE — Progress Notes (Signed)
Ref: Parke Poisson, DO   Subjective:  Feeling better. VS stable Mild respiratory distress continues. Resting comfortably. Discussed diet, activity and medications yesterday. Good diuresis with stable potassium and creatinine levels.  Objective:  Vital Signs in the last 24 hours: Temp:  [97.16 F (36.2 C)-99.14 F (37.3 C)] 98 F (36.7 C) (12/13 2359) Pulse Rate:  [71-136] 92 (12/14 0500) Cardiac Rhythm: Normal sinus rhythm (12/14 0400) Resp:  [16-30] 24 (12/14 0500) BP: (113-168)/(69-136) 144/85 (12/14 0500) SpO2:  [86 %-100 %] 95 % (12/14 0500) FiO2 (%):  [40 %-44 %] 44 % (12/13 1352)  Physical Exam: BP Readings from Last 1 Encounters:  02/01/20 (!) 144/85     Wt Readings from Last 1 Encounters:  01/31/20 (!) 145.9 kg    Weight change:  Body mass index is 42.44 kg/m. HEENT: [place]/AT, Eyes-Blue, Conjunctiva-Pink, Sclera-Non-icteric Neck: No JVD, No bruit, Trachea midline. Lungs:  Mild wheezing, Bilateral. Cardiac:  Regular rhythm, normal S1 and S2, no S3. II/VI systolic murmur. Abdomen:  Soft, non-tender. BS present. Extremities:  2 + edema present. No cyanosis. No clubbing. CNS: AxOx3, Cranial nerves grossly intact, moves all 4 extremities.  Skin: Warm and dry.   Intake/Output from previous day: 12/13 0701 - 12/14 0700 In: 705.3 [I.V.:605.3; IV Piggyback:100] Out: 2750 [Urine:2750]    Lab Results: BMET    Component Value Date/Time   NA 142 01/31/2020 1352   NA 137 01/31/2020 0451   NA 137 01/30/2020 2036   NA 140 04/04/2011 1047   K 3.7 01/31/2020 1352   K 3.7 01/31/2020 0451   K 4.0 01/30/2020 2036   K 4.1 04/04/2011 1047   CL 99 01/31/2020 1352   CL 98 01/31/2020 0451   CL 100 01/30/2020 2036   CL 106 04/04/2011 1047   CO2 29 01/31/2020 1352   CO2 28 01/31/2020 0451   CO2 26 01/30/2020 2036   CO2 28 04/04/2011 1047   GLUCOSE 110 (H) 01/31/2020 1352   GLUCOSE 128 (H) 01/31/2020 0451   GLUCOSE 160 (H) 01/30/2020 2036   GLUCOSE 105 (H) 04/04/2011 1047    BUN 21 (H) 01/31/2020 1352   BUN 18 01/31/2020 0451   BUN 17 01/30/2020 2036   BUN 10 04/04/2011 1047   CREATININE 1.09 01/31/2020 1352   CREATININE 1.18 01/31/2020 0451   CREATININE 1.21 01/30/2020 2036   CREATININE 1.13 04/04/2011 1047   CALCIUM 8.2 (L) 01/31/2020 1352   CALCIUM 8.2 (L) 01/31/2020 0451   CALCIUM 8.1 (L) 01/30/2020 2036   CALCIUM 8.7 04/04/2011 1047   GFRNONAA >60 01/31/2020 1352   GFRNONAA >60 01/31/2020 0451   GFRNONAA >60 01/30/2020 2036   GFRNONAA >60 04/04/2011 1047   GFRAA >60 04/04/2011 1047   CBC    Component Value Date/Time   WBC 10.0 02/01/2020 0421   RBC 3.29 (L) 02/01/2020 0421   HGB 11.1 (L) 02/01/2020 0421   HGB 14.4 04/04/2011 1047   HCT 34.1 (L) 02/01/2020 0421   HCT 42.3 04/04/2011 1047   PLT 221 02/01/2020 0421   PLT 236 04/04/2011 1047   MCV 103.6 (H) 02/01/2020 0421   MCV 99 04/04/2011 1047   MCH 33.7 02/01/2020 0421   MCHC 32.6 02/01/2020 0421   RDW 14.2 02/01/2020 0421   RDW 13.4 04/04/2011 1047   LYMPHSABS 3.0 01/29/2020 1041   MONOABS 1.2 (H) 01/29/2020 1041   EOSABS 0.1 01/29/2020 1041   BASOSABS 0.1 01/29/2020 1041   HEPATIC Function Panel Recent Labs    01/29/20 1041 01/30/20 2036  01/31/20 1352  PROT 7.6 6.2* 6.4*   HEMOGLOBIN A1C No components found for: HGA1C,  MPG CARDIAC ENZYMES Lab Results  Component Value Date   TROPONINI < 0.02 04/04/2011   BNP No results for input(s): PROBNP in the last 8760 hours. TSH No results for input(s): TSH in the last 8760 hours. CHOLESTEROL No results for input(s): CHOL in the last 8760 hours.  Scheduled Meds:  albuterol  2.5 mg Nebulization QID   aspirin  81 mg Oral Daily   aspirin  81 mg Oral Pre-Cath   atorvastatin  80 mg Oral Daily   carvedilol  3.125 mg Oral BID WC   chlorhexidine gluconate (MEDLINE KIT)  15 mL Mouth Rinse BID   Chlorhexidine Gluconate Cloth  6 each Topical Q0600   docusate sodium  100 mg Oral BID   fluticasone furoate-vilanterol  1  puff Inhalation Daily   folic acid  1 mg Oral Daily   losartan  25 mg Oral Daily   mupirocin ointment  1 application Nasal BID   polyethylene glycol  17 g Oral Daily   potassium chloride  10 mEq Oral TID   sodium chloride flush  3 mL Intravenous Q12H   sodium chloride flush  3 mL Intravenous Q12H   thiamine  100 mg Oral Daily   ticagrelor  90 mg Oral BID   umeclidinium bromide  1 puff Inhalation Daily   Continuous Infusions:  sodium chloride     sodium chloride     sodium chloride 50 mL/hr at 02/01/20 0500   cefTRIAXone (ROCEPHIN)  IV Stopped (01/31/20 1254)   PRN Meds:.sodium chloride, sodium chloride, docusate sodium, ondansetron (ZOFRAN) IV, oxyCODONE, polyethylene glycol, sodium chloride flush, sodium chloride flush  Assessment/Plan: Acute anteroseptal wall MI Acute systolic left heart failure, HFrEF Dilated cardiomyopathy Acute respiratory failure from above Tobacco use disorder Alcohol ise disorder Hypertension Hyperlipidemia Hyperglycemia Moderate pulmonary systolic hypertension  Awaiting LAD intervention today Add spironolactone.    LOS: 3 days   Time spent including chart review, lab review, examination, discussion with patient/Nurse : 30 min   Dixie Dials  MD  02/01/2020, 6:32 AM

## 2020-02-01 NOTE — TOC Benefit Eligibility Note (Addendum)
Transition of Care [facility]) Benefit Eligibility Note    Patient Details  Name: Tommy Gaines MRN: [id number] Date of Birth: 09/04/1964   Medication/Dose: Sherryll Burger  24-26 MG BID  Covered?: Yes     Prescription Coverage [facility]  Spoke with Person/Company/Phone Number:: BRITTANY  @ [place] MEDICAID [facility]  RX # [phone]  Co-Pay: $3.00  Prior Approval: Yes [phone])  Deductible:  (NO DEDUCTIBLE  and OUT OF -POCKET WITH PLAN)  BRILINTA  90 MG BID   COVER- YES   CO-PAY- $ 3.00   PRIOR APPROVAL- NO    Mardene Sayer Phone Number: 02/01/2020, 12:41 PM

## 2020-02-01 NOTE — Interval H&P Note (Signed)
History and Physical Interval Note:  02/01/2020 7:50 AM  Tommy Gaines  has presented today for surgery, with the diagnosis of cad.  The various methods of treatment have been discussed with the patient and family. After consideration of risks, benefits and other options for treatment, the patient has consented to  Procedure(s): CORONARY STENT INTERVENTION (N/A) as a surgical intervention.  The patient's history has been reviewed, patient examined, no change in status, stable for surgery.  I have reviewed the patient's chart and labs.  Questions were answered to the patient's satisfaction.   Cath Lab Visit (complete for each Cath Lab visit)  Clinical Evaluation Leading to the Procedure:   ACS: Yes.    Non-ACS:    Anginal Classification: CCS IV  Anti-ischemic medical therapy: Minimal Therapy (1 class of medications)  Non-Invasive Test Results: No non-invasive testing performed  Prior CABG: No previous CABG  Yates Decamp

## 2020-02-01 NOTE — TOC Initial Note (Signed)
Transition of Care [facility]) - Initial/Assessment Note    Patient Details  Name: Tommy Gaines MRN: [id number] Date of Birth: 09/04/1964  [facility]) CM/SW Contact:    Curlene Labrum, RN Phone Number: 02/01/2020, 11:15 AM  Clinical Narrative:                 Case management met with the patient at the bedside but unable to assess at this time due to post procedure and sedation.  I placed a benefit's consult with cma with TOC for Brilinta and Entresto - patient with Medicaid.  Left Entresto and Brilinta information at the bedside with the patient at this time.  Medications for discharge can be filled through [place].  Expected Discharge Plan: Home/Self Care Barriers to Discharge: Continued Medical Work up   Patient Goals and CMS Choice Patient states their goals for this hospitalization and ongoing recovery are:: Patient unable to verbalize goals at this time. CMS Medicare.gov Compare Post Acute Care list provided to:: Patient Choice offered to / list presented to : Patient  Expected Discharge Plan and Services Expected Discharge Plan: Home/Self Care   Discharge Planning Services: CM Consult,Medication Assistance                                          Prior Living Arrangements/Services   Lives with:: Significant Other (Patient with assistance from girlfriend and mother) Patient language and need for interpreter reviewed:: Yes        Need for Family Participation in Patient Care: Yes (Comment) Care giver support system in place?: Yes (comment)   Criminal Activity/Legal Involvement Pertinent to Current Situation/Hospitalization: No - Comment as needed  Activities of Daily Living Home Assistive Devices/Equipment: None (UTA) ADL Screening (condition at time of admission) Patient's cognitive ability adequate to safely complete daily activities?:  (UTA) Is the patient deaf or have difficulty hearing?:  (UTA) Does the patient have difficulty  seeing, even when wearing glasses/contacts?:  (UTA) Does the patient have difficulty concentrating, remembering, or making decisions?:  (UTA) Patient able to express need for assistance with ADLs?:  (UTA) Does the patient have difficulty dressing or bathing?:  (UTA) Independently performs ADLs?:  (UTA) Does the patient have difficulty walking or climbing stairs?:  (UTA) Weakness of Legs: Both Weakness of Arms/Hands: Both  Permission Sought/Granted Permission sought to share information with : Case Manager Permission granted to share information with : Yes, Verbal Permission Granted     Permission granted to share info w AGENCY: TOC pharmacy for Medication assistance.  Permission granted to share info w Relationship: family contacts     Emotional Assessment Appearance:: Appears stated age Attitude/Demeanor/Rapport: Sedated Affect (typically observed): Accepting Orientation: : Oriented to Self Alcohol / Substance Use: Not Applicable Psych Involvement: No (comment)  Admission diagnosis:  Acute respiratory failure ([address]) [J96.00] Patient Active Problem List   Diagnosis Date Noted  . Endotracheally intubated   . Alcohol use   . Acute encephalopathy   . Non-ST elevation (NSTEMI) myocardial infarction ([place])   . Acute systolic heart failure ([place])   . Respiratory failure ([place]) 01/29/2020  . Acute respiratory failure ([place]) 01/29/2020  . Chronic pain syndrome 09/10/2017  . Primary osteoarthritis of both knees 09/10/2017  . Primary osteoarthritis of both shoulders 09/10/2017  . GERD (gastroesophageal reflux disease) 10/17/2016  . Chronic pain of both knees 10/03/2015  . Chronic pain of both shoulders  10/03/2015  . History of replacement of both shoulder joints 10/03/2015  . Tendonitis of elbow, left 08/13/2012  . Obesity 08/15/2011  . Chronic airway obstruction ([place]) 07/30/2011   PCP:  Parke Poisson, DO Pharmacy[facility]   [id number] [place] [id number]: [phone]: [phone]    Social Determinants of Health (SDOH) Interventions    Readmission Risk Interventions Readmission Risk Prevention Plan 02/01/2020  Transportation Screening Complete  PCP or Specialist Appt within 5-7 Days Complete  Home Care Screening Complete  Medication Review (RN CM) Complete  Some recent data might be hidden

## 2020-02-01 NOTE — Plan of Care (Signed)
  Problem: Education: Goal: Knowledge of General Education information will improve Description: Including pain rating scale, medication(s)/side effects and non-pharmacologic comfort measures Outcome: Not Progressing   Problem: Activity: Goal: Risk for activity intolerance will decrease Outcome: Not Progressing   

## 2020-02-01 NOTE — Care Management (Signed)
Case management placed benefits check regarding Entresto and Brilinta cost.  The patient has insurance medications coverage through Medicaid.  CMA with TOC to check benefits and make a note.

## 2020-02-02 LAB — BASIC METABOLIC PANEL
Anion gap: 10 (ref 5–15)
BUN: 18 mg/dL (ref 6–20)
CO2: 28 mmol/L (ref 22–32)
Calcium: 8.6 mg/dL — ABNORMAL LOW (ref 8.9–10.3)
Chloride: 99 mmol/L (ref 98–111)
Creatinine, Ser: 0.79 mg/dL (ref 0.61–1.24)
GFR, Estimated: >60 mL/min (ref >60)
Glucose, Bld: 90 mg/dL (ref 70–99)
Potassium: 3.6 mmol/L (ref 3.5–5.1)
Sodium: 137 mmol/L (ref 135–145)

## 2020-02-02 LAB — CBC
HCT: 33.2 % — ABNORMAL LOW (ref 39.0–52.0)
Hemoglobin: 11.1 g/dL — ABNORMAL LOW (ref 13.0–17.0)
MCH: 34.2 pg — ABNORMAL HIGH (ref 26.0–34.0)
MCHC: 33.4 g/dL (ref 30.0–36.0)
MCV: 102.2 fL — ABNORMAL HIGH (ref 80.0–100.0)
Platelets: 221 10*3/uL (ref 150–400)
RBC: 3.25 MIL/uL — ABNORMAL LOW (ref 4.22–5.81)
RDW: 13.7 % (ref 11.5–15.5)
WBC: 9.5 10*3/uL (ref 4.0–10.5)
nRBC: 0 % (ref 0.0–0.2)

## 2020-02-02 LAB — MAGNESIUM: Magnesium: 2.2 mg/dL (ref 1.7–2.4)

## 2020-02-02 MED ORDER — ALBUTEROL SULFATE (2.5 MG/3ML) 0.083% IN NEBU
2.5000 mg | INHALATION_SOLUTION | Freq: Three times a day (TID) | RESPIRATORY_TRACT | Status: DC
  Administered 2020-02-02 – 2020-02-03 (×2): 2.5 mg via RESPIRATORY_TRACT
  Filled 2020-02-02 (×2): qty 3

## 2020-02-02 MED ORDER — ORAL CARE MOUTH RINSE
15.0000 mL | Freq: Two times a day (BID) | OROMUCOSAL | Status: DC
  Administered 2020-02-02 (×2): 15 mL via OROMUCOSAL

## 2020-02-02 MED ORDER — POTASSIUM CHLORIDE CRYS ER 20 MEQ PO TBCR
20.0000 meq | EXTENDED_RELEASE_TABLET | Freq: Once | ORAL | Status: AC
  Administered 2020-02-02: 05:00:00 20 meq via ORAL
  Filled 2020-02-02: qty 1

## 2020-02-02 MED ORDER — METOPROLOL TARTRATE 50 MG PO TABS
50.0000 mg | ORAL_TABLET | Freq: Two times a day (BID) | ORAL | Status: DC
  Administered 2020-02-02 (×2): 50 mg via ORAL
  Filled 2020-02-02 (×2): qty 1

## 2020-02-02 NOTE — Progress Notes (Signed)
Subjective:  He feels much better, he has not had any more chest tightness that he had prior to the hospitalization. States that his dyspnea has also improved. Continues to have back pain that is chronic. Also has dyspnea on exertion that is chronic.  Intake/Output from previous day:  I/O last 3 completed shifts: In: 678.8 [I.V.:578.8; IV Piggyback:100] Out: 3300 [Urine:3300] Total I/O In: 120 [P.O.:120] Out: 350 [Urine:350]  Blood pressure 139/81, pulse 93, temperature 97.9 F (36.6 C), temperature source Oral, resp. rate (!) 28, height _0  (1.854 m), weight (!) 142.8 kg, SpO2 95 %. Body mass index is 41.54 kg/m.  Physical Exam Constitutional:      Comments: Morbidly obese in no acute distress.  Cardiovascular:     Rate and Rhythm: Normal rate and regular rhythm.     Pulses:          Carotid pulses are 2+ on the right side and 2+ on the left side.      Dorsalis pedis pulses are 2+ on the right side and 2+ on the left side.       Posterior tibial pulses are 2+ on the right side and 2+ on the left side.     Heart sounds: Normal heart sounds. No murmur heard. No gallop.      Comments: Femoral and popliteal pulse difficult to feel due to patient's body habitus.  No leg edema. JVD difficult to see due to short neck. Pulmonary:     Effort: Pulmonary effort is normal.     Breath sounds: Wheezing (Scattered, bilateral.) present.  Abdominal:     General: Bowel sounds are normal.     Palpations: Abdomen is soft.     Comments: Obese. Pannus present     Lab Results: BMP BNP (last 3 results) Recent Labs    01/29/20 1041  BNP 377.3*    ProBNP (last 3 results) No results for input(s): PROBNP in the last 8760 hours. BMP Latest Ref Rng & Units 02/02/2020 01/31/2020 01/31/2020  Glucose 70 - 99 mg/dL 90 110(H) 128(H)  BUN 6 - 20 mg/dL 18 21(H) 18  Creatinine 0.61 - 1.24 mg/dL 0.79 1.09 1.18  Sodium 135 - 145 mmol/L 137 142 137  Potassium 3.5 - 5.1 mmol/L 3.6 3.7 3.7  Chloride  98 - 111 mmol/L 99 99 98  CO2 22 - 32 mmol/L _1 Calcium 8.9 - 10.3 mg/dL 8.6(L) 8.2(L) 8.2(L)   Hepatic Function Latest Ref Rng & Units 01/31/2020 01/30/2020 01/29/2020  Total Protein 6.5 - 8.1 g/dL 6.4(L) 6.2(L) 7.6  Albumin 3.5 - 5.0 g/dL 2.8(L) 2.7(L) 3.5  AST 15 - 41 U/L 111(H) 152(H) 119(H)  ALT 0 - 44 U/L 60(H) 70(H) 60(H)  Alk Phosphatase 38 - 126 U/L 53 56 86  Total Bilirubin 0.3 - 1.2 mg/dL 0.6 0.5 1.0   CBC Latest Ref Rng & Units 02/02/2020 02/01/2020 01/31/2020  WBC 4.0 - 10.5 K/uL 9.5 10.0 9.1  Hemoglobin 13.0 - 17.0 g/dL 11.1(L) 11.1(L) 11.6(L)  Hematocrit 39.0 - 52.0 % 33.2(L) 34.1(L) 34.6(L)  Platelets 150 - 400 K/uL 221 221 249   Lipid Panel     Component Value Date/Time   CHOL 127 02/01/2020 0421   TRIG 129 02/01/2020 0421   TRIG 140 02/01/2020 0421   HDL 28 (L) 02/01/2020 0421   CHOLHDL 4.5 02/01/2020 0421   VLDL 28 02/01/2020 0421   LDLCALC 71 02/01/2020 0421   Cardiac Panel (last 3 results) No results for input(s): CKTOTAL, CKMB,  TROPONINI, RELINDX in the last 72 hours.  HEMOGLOBIN A1C Lab Results  Component Value Date   HGBA1C 5.4 01/31/2020   MPG 108.28 01/31/2020   TSH No results for input(s): TSH in the last 8760 hours. Imaging:  Cardiac Studies:  Coronary angiography 01/30/2020: LM: Normal LAD: 100% mid LAD CTO after a large diagonal. Diagonal has 75% long eccentric lesion.          Left-to-left and right-to-left collaterals from large diagonals, ramus, LCx, and distal RCA to mid LAD. LAD itself appears to terminate short of apex  Ramus: Prox 50% stenosis LCx: Focal 75 % prox stenosis RCA: Prox 95%, mid 75% stenosis. (Culprit vessel) TIMI flow II distally  RA: 14 mmHg RV: 51/12 mmHg PA: 51/29 mmHg, mean PAP 39 mmHg PCW: 24-28 mmHg Qp/Qs: 1.0  CO: 11.2 L/min CI: CI 4.2 L/min/m2 PAPi 0.7  Elevated filling pressures without shock. Able to titrate norepinephrine off in the lab Successful coronary intervention PTCA and  stent placement mid RCA Resolute Onyx 3.5X26 mm DES Post-dilatation using 3.75X15 mm  balloon, dilated up to 22 atm.  Echocardiogram 01/30/2020: 1. Severe left ventricular systolic dysfunction with moderate dilatation and global hypokinesis, there is no definite apical thrombus on the Definity echocontrast images. Grade 2 diastolic dysfunction with evidence for elevated filling pressures. RVEF  is mildly decreased.  2. Left ventricular ejection fraction, by estimation, is 25 to 30%. The left ventricle has severely decreased function. The left ventricle demonstrates global hypokinesis. The left ventricular internal cavity size was moderately dilated. There is mild  concentric left ventricular hypertrophy. Left ventricular diastolic parameters are consistent with Grade II diastolic dysfunction (pseudonormalization). Elevated left atrial pressure.  3. Right ventricular systolic function is mildly reduced. The right ventricular size is mildly enlarged. There is normal pulmonary artery systolic pressure.  4. Left atrial size was moderately dilated.  5. Right atrial size was mildly dilated.  6. The mitral valve is normal in structure. Mild mitral valve regurgitation. No evidence of mitral stenosis.  7. The aortic valve is normal in structure. Aortic valve regurgitation is not visualized. No aortic stenosis is present.  8. The inferior vena cava is dilated in size with <50% respiratory variability, suggesting right atrial pressure of 15 mmHg.   Coronary angioplasty 02/01/2020: Successful, complex PTCA and Y-stenting to nondominant mid LAD which is severely diffusely diseased and large dominant D1 which essentially supplies the entire anterolateral wall and apex with implantation of 2.0 x 38, 2.0 x 30 and a 2.0 x 15 mm overlapping stent into the mid LAD at D1 bifurcation and a 2.5 x 38 and a 2.5 x 8 mm resolute Onyx DES in the large dominant D1 mid segment up to D1/LAD bifurcation.  Subtotally occluded LAD  reduced to 0% stenosis, TIMI flow improved from 1-3 and D1 stenosis reduced from 80% to 0% with TIMI-3 to TIMI-3 flow. 140 mL contrast utilized.   Recommendation: Patient will need dual antiplatelet therapy for at least 1 year.  He will need aggressive risk factor modification. Extremely complex procedure with multiple wires and multiple balloon and stent exchange in a patient with nearly morbidly obese and in distress.  Prolonged procedure. 1. Severe left ventricular systolic dysfunction with moderate dilatation and global hypokinesis, there is no definite apical thrombus on the Definity echocontrast images. Grade 2 diastolic dysfunction with evidence for elevated filling pressures. RVEF  is mildly decreased.  2. Left ventricular ejection fraction, by estimation, is 25 to 30%. The left ventricle has severely decreased  function. The left ventricle demonstrates global hypokinesis. The left ventricular internal cavity size was moderately dilated. There is mild  concentric left ventricular hypertrophy. Left ventricular diastolic parameters are consistent with Grade II diastolic dysfunction (pseudonormalization). Elevated left atrial pressure.  3. Right ventricular systolic function is mildly reduced. The right ventricular size is mildly enlarged. There is normal pulmonary artery systolic pressure.  4. Left atrial size was moderately dilated.  5. Right atrial size was mildly dilated.  6. The mitral valve is normal in structure. Mild mitral valve regurgitation. No evidence of mitral stenosis.  7. The aortic valve is normal in structure. Aortic valve regurgitation is not visualized. No aortic stenosis is present.  8. The inferior vena cava is dilated in size with <50% respiratory variability, suggesting right atrial pressure of 15 mmHg.   EKG 02/02/2020: Normal sinus rhythm at rate of 82 bpm, left axis deviation, left intrafascicular block. Poor R wave progression, cannot exclude anteroseptal infarct no  significant change from prior EKG.   Scheduled Meds: . albuterol  2.5 mg Nebulization QID  . aspirin  81 mg Oral Daily  . atorvastatin  80 mg Oral Daily  . carvedilol  3.125 mg Oral BID WC  . Chlorhexidine Gluconate Cloth  6 each Topical Q0600  . docusate sodium  100 mg Oral BID  . fluticasone furoate-vilanterol  1 puff Inhalation Daily  . folic acid  1 mg Oral Daily  . losartan  25 mg Oral Daily  . mouth rinse  15 mL Mouth Rinse BID  . mupirocin ointment  1 application Nasal BID  . polyethylene glycol  17 g Oral Daily  . sodium chloride flush  3 mL Intravenous Q12H  . sodium chloride flush  3 mL Intravenous Q12H  . spironolactone  25 mg Oral Daily  . thiamine  100 mg Oral Daily  . ticagrelor  90 mg Oral BID  . umeclidinium bromide  1 puff Inhalation Daily   Continuous Infusions: . sodium chloride    . sodium chloride    . cefTRIAXone (ROCEPHIN)  IV Stopped (02/01/20 1239)   PRN Meds:.sodium chloride, sodium chloride, acetaminophen, docusate sodium, ondansetron (ZOFRAN) IV, oxyCODONE, polyethylene glycol, sodium chloride flush, sodium chloride flush  Assessment/Plan:  1. NSTEMI 2. Coronary artery disease of the native vessel with unstable angina 3. Acute systolic and diastolic heart failure, ischemic cardiomyopathy. 4. Morbid obesity and suspect severe obstructive sleep apnea, episodes of frequent PVCs last night, bradycardia,  3 beat NSVT. Electrolytes normal. 5. Tobacco use disorder 6. Primary hypertension 7. Hyperlipidemia 8. Tobacco use disorder  Recommendation: Patient presently not having any further chest discomfort, dyspnea has improved, lungs are clear except for rhonchi bilaterally, related to underlying COPD. Does not appear to be in acute decompensated heart failure. Renal function has remained stable.  Continue aggressive risk factor modification, continue dual antiplatelet therapy with aspirin and Brilinta for at least 1 year. Continue high intensity  statin.  I have discussed with the patient regarding smoking cessation. With regard to hypertension CAD, discontinue carvedilol in view of underlying COPD, switch him to metoprolol tartrate 50 mg p.o. twice daily. Presently on losartan, could consider changing over to [facility].  No complications from right radial arterial access. He can be transferred out, needs cardiac rehab.  Further recommendations is as per Dr. Doylene Canard who is his primary cardiologist. I will sign off.    Adrian Prows, MD, [facility] 02/02/2020, 9:37 AM Office: [phone] Pager: [phone] Office: [phone] If no answer: [phone]

## 2020-02-02 NOTE — Progress Notes (Addendum)
CARDIAC REHAB PHASE I   PRE:  Rate/Rhythm: 87 SR with PVCs  BP:  Sitting: 129/73      SaO2: 93 RA  MODE:  Ambulation: 160 ft   POST:  Rate/Rhythm: 106 ST with PVCs  BP:  Sitting: 152/76    SaO2: 96 RA  Pt ambulated 184ft in hallway standby assist with front wheel walker. Pt took one standing rest break c/o back pain. Pt ambulated with elbows resting on walker for remainder. Pt returned to bed. Able to maintain sats on room air. Reinforced importance of ASA and Brilinta, encouraged daily weights. Pt denies further questions or concerns at this time. Will refer to CRP II [place]. Will continue to follow.  [id number] Reynold Bowen, RN BSN 02/02/2020 10:25 AM

## 2020-02-02 NOTE — Progress Notes (Signed)
CARDIAC REHAB PHASE I   Offered to walk with pt. Pt declining, states he needs an hour. Pt helped to BR then back to recliner. Will f/u.  [id number] Reynold Bowen, RN BSN 02/02/2020 9:09 AM

## 2020-02-02 NOTE — Progress Notes (Signed)
Pt received and is alert and oriented times four. Patient is oriented to the room and floor. TELE box is confirmed and skin assessment is done. Safety measures maintained

## 2020-02-02 NOTE — Progress Notes (Signed)
Ref: Marta Antu, DO   Subjective:  Awake. Shortness of breath continues. Leg edema is decreasing. Monitor sinus rhythm. O2 sat 85-96% on room air.  Objective:  Vital Signs in the last 24 hours: Temp:  [97.8 F (36.6 C)-98.6 F (37 C)] 97.8 F (36.6 C) (12/15 2022) Pulse Rate:  [72-97] 77 (12/15 2022) Cardiac Rhythm: Normal sinus rhythm (12/15 1901) Resp:  [17-28] 21 (12/15 2022) BP: (120-152)/(74-104) 129/89 (12/15 2022) SpO2:  [86 %-100 %] 100 % (12/15 2022) FiO2 (%):  [21 %-32 %] 21 % (12/15 1202) Weight:  [141.7 kg-142.8 kg] 141.7 kg (12/15 1448)  Physical Exam: BP Readings from Last 1 Encounters:  02/02/20 129/89     Wt Readings from Last 1 Encounters:  02/02/20 (!) 141.7 kg    Weight change: -0.9 kg Body mass index is 41.22 kg/m. HEENT: [place]/AT, Eyes-Blue, Conjunctiva-Pink, Sclera-Non-icteric Neck: No JVD, No bruit, Trachea midline. Lungs:  Wheezing, Bilateral. Cardiac:  Regular rhythm, normal S1 and S2, no S3. II/VI systolic murmur. Abdomen:  Soft, non-tender. BS present. Extremities:  2 + ankle edema present. No cyanosis. No clubbing. CNS: AxOx3, Cranial nerves grossly intact, moves all 4 extremities.  Skin: Warm and dry.   Intake/Output from previous day: 12/14 0701 - 12/15 0700 In: 100 [IV Piggyback:100] Out: 2050 [Urine:2050]    Lab Results: BMET    Component Value Date/Time   NA 137 02/02/2020 0320   NA 142 01/31/2020 1352   NA 137 01/31/2020 0451   NA 140 04/04/2011 1047   K 3.6 02/02/2020 0320   K 3.7 01/31/2020 1352   K 3.7 01/31/2020 0451   K 4.1 04/04/2011 1047   CL 99 02/02/2020 0320   CL 99 01/31/2020 1352   CL 98 01/31/2020 0451   CL 106 04/04/2011 1047   CO2 28 02/02/2020 0320   CO2 29 01/31/2020 1352   CO2 28 01/31/2020 0451   CO2 28 04/04/2011 1047   GLUCOSE 90 02/02/2020 0320   GLUCOSE 110 (H) 01/31/2020 1352   GLUCOSE 128 (H) 01/31/2020 0451   GLUCOSE 105 (H) 04/04/2011 1047   BUN 18 02/02/2020 0320   BUN 21 (H) 01/31/2020  1352   BUN 18 01/31/2020 0451   BUN 10 04/04/2011 1047   CREATININE 0.79 02/02/2020 0320   CREATININE 1.09 01/31/2020 1352   CREATININE 1.18 01/31/2020 0451   CREATININE 1.13 04/04/2011 1047   CALCIUM 8.6 (L) 02/02/2020 0320   CALCIUM 8.2 (L) 01/31/2020 1352   CALCIUM 8.2 (L) 01/31/2020 0451   CALCIUM 8.7 04/04/2011 1047   GFRNONAA >60 02/02/2020 0320   GFRNONAA >60 01/31/2020 1352   GFRNONAA >60 01/31/2020 0451   GFRNONAA >60 04/04/2011 1047   GFRAA >60 04/04/2011 1047   CBC    Component Value Date/Time   WBC 9.5 02/02/2020 0320   RBC 3.25 (L) 02/02/2020 0320   HGB 11.1 (L) 02/02/2020 0320   HGB 14.4 04/04/2011 1047   HCT 33.2 (L) 02/02/2020 0320   HCT 42.3 04/04/2011 1047   PLT 221 02/02/2020 0320   PLT 236 04/04/2011 1047   MCV 102.2 (H) 02/02/2020 0320   MCV 99 04/04/2011 1047   MCH 34.2 (H) 02/02/2020 0320   MCHC 33.4 02/02/2020 0320   RDW 13.7 02/02/2020 0320   RDW 13.4 04/04/2011 1047   LYMPHSABS 3.0 01/29/2020 1041   MONOABS 1.2 (H) 01/29/2020 1041   EOSABS 0.1 01/29/2020 1041   BASOSABS 0.1 01/29/2020 1041   HEPATIC Function Panel Recent Labs    01/29/20 1041  01/30/20 2036 01/31/20 1352  PROT 7.6 6.2* 6.4*   HEMOGLOBIN A1C No components found for: HGA1C,  MPG CARDIAC ENZYMES Lab Results  Component Value Date   TROPONINI < 0.02 04/04/2011   BNP No results for input(s): PROBNP in the last 8760 hours. TSH No results for input(s): TSH in the last 8760 hours. CHOLESTEROL Recent Labs    02/01/20 0421  CHOL 127    Scheduled Meds: . albuterol  2.5 mg Nebulization TID  . aspirin  81 mg Oral Daily  . atorvastatin  80 mg Oral Daily  . Chlorhexidine Gluconate Cloth  6 each Topical Q0600  . docusate sodium  100 mg Oral BID  . fluticasone furoate-vilanterol  1 puff Inhalation Daily  . folic acid  1 mg Oral Daily  . losartan  25 mg Oral Daily  . mouth rinse  15 mL Mouth Rinse BID  . metoprolol tartrate  50 mg Oral BID  . mupirocin ointment  1  application Nasal BID  . polyethylene glycol  17 g Oral Daily  . sodium chloride flush  3 mL Intravenous Q12H  . sodium chloride flush  3 mL Intravenous Q12H  . spironolactone  25 mg Oral Daily  . thiamine  100 mg Oral Daily  . ticagrelor  90 mg Oral BID  . umeclidinium bromide  1 puff Inhalation Daily   Continuous Infusions: . sodium chloride    . sodium chloride 250 mL (02/02/20 1639)   PRN Meds:.sodium chloride, sodium chloride, acetaminophen, docusate sodium, ondansetron (ZOFRAN) IV, oxyCODONE, polyethylene glycol, sodium chloride flush, sodium chloride flush  Assessment/Plan: Acute anteroseptal MI Acute systolic left heart failure Dilated cardiomyopathy Acute respiratory failure Tobacco use disorder Alcohol use disorder Hypertension Hyperlipidemia Hyperglycemia Moderate pulmonary artery systolic hypertension   Start Torsemide 10 mg. Daily. Add potassium 10 meq bid. Increase activity.I   LOS: 4 days   Time spent including chart review, lab review, examination, discussion with patient :  min   Orpah Cobb  MD  02/02/2020, 11:53 PM

## 2020-02-02 NOTE — TOC Benefit Eligibility Note (Addendum)
Patient Advocate Encounter   Received notification that prior authorization for Entresto 24-26 mg is required.   PA submitted on 02/02/2020 Key [id number] Status is pending    Patient Advocate Encounter  Prior Authorization for Sherryll Burger 24-26 has been approved.    PA# [id number] Effective dates: 02/02/2020 through 02/01/2021   Roland Earl, CPhT Certified Pharmacy Technician- Transitions of Care [facility] Antimicrobial Stewardship Team Direct Number: [phone]  Fax: [phone]

## 2020-02-03 LAB — CBC
HCT: 33.6 % — ABNORMAL LOW (ref 39.0–52.0)
Hemoglobin: 11.1 g/dL — ABNORMAL LOW (ref 13.0–17.0)
MCH: 32.6 pg (ref 26.0–34.0)
MCHC: 33 g/dL (ref 30.0–36.0)
MCV: 98.8 fL (ref 80.0–100.0)
Platelets: 244 10*3/uL (ref 150–400)
RBC: 3.4 MIL/uL — ABNORMAL LOW (ref 4.22–5.81)
RDW: 13.4 % (ref 11.5–15.5)
WBC: 10.6 10*3/uL — ABNORMAL HIGH (ref 4.0–10.5)
nRBC: 0 % (ref 0.0–0.2)

## 2020-02-03 LAB — CULTURE, BLOOD (ROUTINE X 2)
Culture: NO GROWTH
Culture: NO GROWTH

## 2020-02-03 LAB — BASIC METABOLIC PANEL
Anion gap: 9 (ref 5–15)
BUN: 17 mg/dL (ref 6–20)
CO2: 26 mmol/L (ref 22–32)
Calcium: 8.8 mg/dL — ABNORMAL LOW (ref 8.9–10.3)
Chloride: 101 mmol/L (ref 98–111)
Creatinine, Ser: 0.79 mg/dL (ref 0.61–1.24)
GFR, Estimated: >60 mL/min (ref >60)
Glucose, Bld: 148 mg/dL — ABNORMAL HIGH (ref 70–99)
Potassium: 3.6 mmol/L (ref 3.5–5.1)
Sodium: 136 mmol/L (ref 135–145)

## 2020-02-03 MED ORDER — ALBUTEROL SULFATE (2.5 MG/3ML) 0.083% IN NEBU: 2.5000 mg | INHALATION_SOLUTION | Freq: Two times a day (BID) | RESPIRATORY_TRACT | Status: DC

## 2020-02-03 MED ORDER — SACUBITRIL-VALSARTAN 24-26 MG PO TABS: 1.0000 | ORAL_TABLET | Freq: Two times a day (BID) | ORAL | 3 refills | Status: DC

## 2020-02-03 MED ORDER — ATORVASTATIN CALCIUM 80 MG PO TABS: 80.0000 mg | ORAL_TABLET | Freq: Every day | ORAL | 3 refills | Status: AC

## 2020-02-03 MED ORDER — METOPROLOL TARTRATE 25 MG PO TABS
25.0000 mg | ORAL_TABLET | Freq: Two times a day (BID) | ORAL | Status: DC
Start: 2020-02-03 — End: 2020-02-03

## 2020-02-03 MED ORDER — CARVEDILOL 3.125 MG PO TABS: 3.1250 mg | ORAL_TABLET | Freq: Two times a day (BID) | ORAL | 3 refills | Status: DC

## 2020-02-03 MED ORDER — POTASSIUM CHLORIDE CRYS ER 10 MEQ PO TBCR: 10.0000 meq | EXTENDED_RELEASE_TABLET | Freq: Every day | ORAL | Status: DC

## 2020-02-03 MED ORDER — SACUBITRIL-VALSARTAN 24-26 MG PO TABS
1.0000 | ORAL_TABLET | Freq: Two times a day (BID) | ORAL | Status: DC
  Administered 2020-02-03: 08:00:00 1 via ORAL
  Filled 2020-02-03 (×2): qty 1

## 2020-02-03 MED ORDER — THIAMINE HCL 100 MG PO TABS
100.0000 mg | ORAL_TABLET | Freq: Every day | ORAL | 3 refills | Status: AC
Start: 2020-02-04 — End: ?

## 2020-02-03 MED ORDER — POTASSIUM CHLORIDE CRYS ER 10 MEQ PO TBCR
10.0000 meq | EXTENDED_RELEASE_TABLET | Freq: Two times a day (BID) | ORAL | Status: DC
  Administered 2020-02-03: 06:00:00 10 meq via ORAL
  Filled 2020-02-03: qty 1

## 2020-02-03 MED ORDER — DOCUSATE SODIUM 100 MG PO CAPS: 100.0000 mg | ORAL_CAPSULE | Freq: Two times a day (BID) | ORAL | 0 refills | Status: AC

## 2020-02-03 MED ORDER — POLYETHYLENE GLYCOL 3350 17 G PO PACK: 17.0000 g | PACK | Freq: Every day | ORAL | 0 refills | Status: AC

## 2020-02-03 MED ORDER — OXYCODONE HCL 5 MG PO TABS
5.0000 mg | ORAL_TABLET | Freq: Four times a day (QID) | ORAL | 0 refills | Status: DC | PRN
Start: 2020-02-03 — End: 2020-11-10

## 2020-02-03 MED ORDER — CARVEDILOL 3.125 MG PO TABS
3.1250 mg | ORAL_TABLET | Freq: Two times a day (BID) | ORAL | Status: DC
  Administered 2020-02-03: 08:00:00 3.125 mg via ORAL
  Filled 2020-02-03 (×2): qty 1

## 2020-02-03 MED ORDER — TICAGRELOR 90 MG PO TABS: 90.0000 mg | ORAL_TABLET | Freq: Two times a day (BID) | ORAL | 6 refills | Status: DC

## 2020-02-03 MED ORDER — SPIRONOLACTONE 25 MG PO TABS: 25.0000 mg | ORAL_TABLET | Freq: Every day | ORAL | 3 refills | Status: DC

## 2020-02-03 MED ORDER — FOLIC ACID 1 MG PO TABS: 1.0000 mg | ORAL_TABLET | Freq: Every day | ORAL | 3 refills | Status: AC

## 2020-02-03 MED ORDER — POTASSIUM CHLORIDE CRYS ER 10 MEQ PO TBCR: 10.0000 meq | EXTENDED_RELEASE_TABLET | Freq: Every day | ORAL | 3 refills | Status: AC

## 2020-02-03 MED ORDER — TORSEMIDE 20 MG PO TABS: 10.0000 mg | ORAL_TABLET | Freq: Every day | ORAL | Status: DC

## 2020-02-03 NOTE — Plan of Care (Signed)

## 2020-02-03 NOTE — Progress Notes (Signed)
CARDIAC REHAB PHASE I   Offered to walk with pt. Pt declining at this time. Pt with stent card, heart attack book, and HF book at bedside. Reinforced importance of ASA, Brilinta, statin, and NTG. Encouraged ambulation as able, daily weights, and med compliance. Pt denies further questions or concerns at this time. Hopeful for d/c later today. Referred to CRP II [place].  [id number] Reynold Bowen, RN BSN 02/03/2020 10:55 AM

## 2020-02-03 NOTE — Discharge Summary (Signed)
Physician Discharge Summary  Patient ID: Tommy Gaines MRN: [id number] DOB/AGE: Jun 16, 1965 54 y.o.  Admit date: 01/29/2020 Discharge date: 02/03/2020  Admission Diagnoses: Acute hypoxic and hypercarbic respiratory failure Acute heart failure exacerbation COPD exacerbation Cardiogenic shock Acute toxic metabolic encephalopathy Hx polysubstance and alcohol abuse NSTEMI AKI  Discharge Diagnoses:  Principle Problem: Acute anteroseptal wall MI with cardiogenic shock Active Problems:   Acute respiratory failure with hypoxia (HCC)   Multivessel, native vessel coronary artery disease   S/P coronary artery stents, multiple   Acute systolic left heart failure (HCC)   Dilated cardiomyopathy   Acute encephalopathy   COPD exacerbation   Tobacco use disorder   Marijuana use disorder   Alcohol use disorder   Polysubstance use disorder   Hypertension   Morbid obesity   Hyperlipidemia   Hyperglycemia, intermittent   Discharged Condition: fair  Hospital Course: 54 years old male was transferred from [place] with respiratory distress and intubation for acute hypoxic and hypocapneic respiratory failure due to COPD exacerbation due to pneumonia.  Cardiology consult was called. Patient had acute systolic left heart failure with dilated cardiomyopathy. His HS-troponin levels were high. He was in cardiogenic shock hence he was taken to cardiac cath lab with emergent RCA stenting followed 2 days later with multiple stents placed in LAD and two in large Diagonal 1 branch.  He diuresed well with IV lasix use. Post 3 days of ICU stay he was transferred to Telemetry floor. Entresto, carvedilol and spironolactone were added along with atorvastatin, aspirin and Brilinta. Patient understood need to give up alcohol and smoking and follow heart healthy diet and take medications regularly.  He will see primary doctor in 1 week and see me in 2 weeks.  Consults: cardiology and pulmonary/intensive  care  Significant Diagnostic Studies: labs: CBC near normal except elevated WBC and Platelets count on admission that normalized next day.  His BMET was near normal with intermittent hyperglycemia. HS-troponin I peaked at 22,634 from 3,145 ng on admission.  AST and ALT were elevated consistent with alcohol use disorder. Lipid panel showed LDL cholesterol of 28 mg/dL Hgb O9G was 5.4 %.  EKG: Evidence of acute anteroseptal MI.  Chest x-ray : Cardiomegaly with interstitial edema and bibasilar atelectasis.  Echocardiogram showed dilated LV with EF 25-30 %.  Cardiac cath: Emergent RCA stenting followed by multiple LAD and Diagonal 1 stens placements.t  Treatments: cardiac meds: Aspirin, Brilinta, Entresto, carvedilol, furosemide, spironolactone, amlodipine and Atorvastatin.  Discharge Exam: Blood pressure 122/70, pulse 83, temperature 98.1 F (36.7 C), resp. rate (!) 23, height 6\' 1"  (1.854 m), weight (!) 142.8 kg, SpO2 97 %. General appearance: alert, cooperative and appears stated age. Head: Normocephalic, atraumatic. Eyes: Blue eyes, pink conjunctiva, corneas clear. PERRL, EOM's intact.  Neck: No adenopathy, no carotid bruit, no JVD, supple, symmetrical, trachea midline and thyroid not enlarged. Resp: Clearing to auscultation bilaterally. Cardio: Regular rate and rhythm, S1, S2 normal, II/VI systolic murmur, no click, rub or gallop. GI: Soft, non-tender; bowel sounds normal; no organomegaly. Extremities: Trace edema, no cyanosis or clubbing. Skin: Warm and dry.  Neurologic: Alert and oriented X 3, normal strength and tone. Normal coordination and slow gait.  Disposition: Discharge disposition: 01-Home or Self Care       Discharge Instructions    Amb Referral to Cardiac Rehabilitation   Complete by: As directed    Diagnosis:  Coronary Stents STEMI     After initial evaluation and assessments completed: Virtual Based Care may be provided alone or in  conjunction with Phase 2  Cardiac Rehab based on patient barriers.: Yes     Allergies as of 02/03/2020      Reactions   Acetaminophen Nausea And Vomiting   Lidocaine Rash      Medication List    STOP taking these medications   amLODipine 5 MG tablet Commonly known as: NORVASC   gabapentin 300 MG capsule Commonly known as: NEURONTIN   lisinopril 5 MG tablet Commonly known as: ZESTRIL   tiZANidine 4 MG tablet Commonly known as: ZANAFLEX     TAKE these medications   albuterol 108 (90 Base) MCG/ACT inhaler Commonly known as: VENTOLIN HFA Inhale 2 puffs into the lungs 3 (three) times daily.   aspirin EC 81 MG tablet Take 81 mg by mouth.   atorvastatin 80 MG tablet Commonly known as: LIPITOR Take 1 tablet (80 mg total) by mouth daily. Start taking on: February 04, 2020 What changed:   medication strength  how much to take  Another medication with the same name was removed. Continue taking this medication, and follow the directions you see here.   budesonide-formoterol 160-4.5 MCG/ACT inhaler Commonly known as: SYMBICORT Inhale 1 puff into the lungs 2 (two) times daily.   carvedilol 3.125 MG tablet Commonly known as: COREG Take 1 tablet (3.125 mg total) by mouth 2 (two) times daily with a meal.   docusate sodium 100 MG capsule Commonly known as: COLACE Take 1 capsule (100 mg total) by mouth 2 (two) times daily.   folic acid 1 MG tablet Commonly known as: FOLVITE Take 1 tablet (1 mg total) by mouth daily. Start taking on: February 04, 2020   furosemide 20 MG tablet Commonly known as: LASIX Take 20 mg by mouth daily.   omeprazole 20 MG capsule Commonly known as: PRILOSEC Take 20 mg by mouth daily as needed (Acid reflux).   oxyCODONE 5 MG immediate release tablet Commonly known as: Oxy IR/ROXICODONE Take 1 tablet (5 mg total) by mouth every 6 (six) hours as needed for severe pain.   polyethylene glycol 17 g packet Commonly known as: MIRALAX / GLYCOLAX Take 17 g by mouth  daily. Start taking on: February 04, 2020   potassium chloride 10 MEQ tablet Commonly known as: KLOR-CON Take 1 tablet (10 mEq total) by mouth daily. Start taking on: February 04, 2020   sacubitril-valsartan 24-26 MG Commonly known as: ENTRESTO Take 1 tablet by mouth 2 (two) times daily.   spironolactone 25 MG tablet Commonly known as: ALDACTONE Take 1 tablet (25 mg total) by mouth daily. Start taking on: February 04, 2020   thiamine 100 MG tablet Take 1 tablet (100 mg total) by mouth daily. Start taking on: February 04, 2020   ticagrelor 90 MG Tabs tablet Commonly known as: BRILINTA Take 1 tablet (90 mg total) by mouth 2 (two) times daily.   tiotropium 18 MCG inhalation capsule Commonly known as: SPIRIVA Place 18 mcg into inhaler and inhale daily.       Follow-up Information    Marta Antu, DO. Schedule an appointment as soon as possible for a visit in 1 week(s).   Specialty: Pediatric Pulmonology Contact information: [address] [place] [address] [phone]        Orpah Cobb, MD. Schedule an appointment as soon as possible for a visit in 2 week(s).   Specialty: Cardiology Contact information: [address] [place] [address] [phone]               Time  spent: Review of old chart, current chart, lab, x-ray, cardiac tests and discussion with patient over 60 minutes.  Signed: Ricki Rodriguez 02/03/2020, 4:53 PM

## 2020-03-01 ENCOUNTER — Other Ambulatory Visit: Payer: Self-pay

## 2020-03-01 ENCOUNTER — Encounter: Payer: Medicaid Other | Attending: Cardiovascular Disease

## 2020-03-01 DIAGNOSIS — I214 Non-ST elevation (NSTEMI) myocardial infarction: Secondary | ICD-10-CM

## 2020-03-01 NOTE — Progress Notes (Signed)
Virtual Visit completed. Patient informed on EP and RD appointment and 6 Minute walk test. Patient also informed of patient health questionnaires on My Chart. Patient Verbalizes understanding. Visit diagnosis can be found in [facility] 02/08/2020.

## 2020-03-20 ENCOUNTER — Other Ambulatory Visit: Payer: Self-pay

## 2020-03-20 VITALS — Ht 72.5 in | Wt 307.7 lb

## 2020-03-20 DIAGNOSIS — I214 Non-ST elevation (NSTEMI) myocardial infarction: Secondary | ICD-10-CM

## 2020-03-20 NOTE — Progress Notes (Signed)
Incomplete Session Note  Patient Details  Name: Tommy Gaines MRN: [id number] Date of Birth: 21-Dec-1965 Referring Provider:    Juanda Chance did not complete his rehab session.     Patient arrived to rehab today for and gym orientation. was not completed. Patient's BP was 84/58 on L arm and rechecked on R arm ranging at around 82/58. Patient asymptomatic besides feeling fatigued and SOB, which claims he has felt since his cardiac event in December. Denies dizziness or lightheadedness. All other vitals were stable. States he has been on a lot of new medications since December and does not drink a lot of water overall. Spoke with RN here. Patient's significant other called cardiology doctor who advised him to hold off on 2 medications tonight and patient will see doctor tomorrow morning for an appointment. Patient and significant other are aware to call [phone] if anything worsens or if he develops any new symptoms. Patient will call [facility] back after speaking with doctor and will reschedule when medically ready. Both agreed with plan above.

## 2020-03-28 ENCOUNTER — Telehealth: Payer: Self-pay

## 2020-03-28 NOTE — Telephone Encounter (Signed)
Left message for patient regarding [facility].

## 2020-04-06 ENCOUNTER — Other Ambulatory Visit: Payer: Self-pay

## 2020-04-06 ENCOUNTER — Encounter: Payer: Medicaid Other | Attending: Cardiovascular Disease

## 2020-04-06 VITALS — Ht 72.5 in | Wt 310.8 lb

## 2020-04-06 DIAGNOSIS — I214 Non-ST elevation (NSTEMI) myocardial infarction: Secondary | ICD-10-CM | POA: Diagnosis not present

## 2020-04-06 DIAGNOSIS — Z955 Presence of coronary angioplasty implant and graft: Secondary | ICD-10-CM | POA: Diagnosis present

## 2020-04-06 NOTE — Progress Notes (Signed)
Cardiac Individual Treatment Plan  Patient Details  Name: Tommy Gaines MRN: [id number] Date of Birth: [birth year] Referring Provider:   Flowsheet Row Cardiac Rehab from 04/06/2020 in [facility] Cardiac and Pulmonary Rehab  Referring Provider Orpah Cobb MD      Initial Encounter Date:  Flowsheet Row Cardiac Rehab from 04/06/2020 in [facility] Cardiac and Pulmonary Rehab  Date 04/06/20      Visit Diagnosis: NSTEMI (non-ST elevated myocardial infarction) [facility])  Status post coronary artery stent placement  Patient's Home Medications on Admission:  Current Outpatient Medications:  .  albuterol (PROVENTIL HFA;VENTOLIN HFA) 108 (90 Base) MCG/ACT inhaler, Inhale 2 puffs into the lungs 3 (three) times daily., Disp: , Rfl:  .  aspirin EC 81 MG tablet, Take 81 mg by mouth., Disp: , Rfl:  .  atorvastatin (LIPITOR) 80 MG tablet, Take 1 tablet (80 mg total) by mouth daily., Disp: 30 tablet, Rfl: 3 .  budesonide-formoterol (SYMBICORT) 160-4.5 MCG/ACT inhaler, Inhale 1 puff into the lungs 2 (two) times daily., Disp: , Rfl:  .  carvedilol (COREG) 3.125 MG tablet, Take 1 tablet (3.125 mg total) by mouth 2 (two) times daily with a meal., Disp: 60 tablet, Rfl: 3 .  docusate sodium (COLACE) 100 MG capsule, Take 1 capsule (100 mg total) by mouth 2 (two) times daily., Disp: 10 capsule, Rfl: 0 .  FLUoxetine (PROZAC) 20 MG capsule, Take by mouth., Disp: , Rfl:  .  folic acid (FOLVITE) 1 MG tablet, Take 1 tablet (1 mg total) by mouth daily., Disp: 30 tablet, Rfl: 3 .  furosemide (LASIX) 20 MG tablet, Take 20 mg by mouth daily., Disp: , Rfl:  .  gabapentin (NEURONTIN) 300 MG capsule, Take by mouth., Disp: , Rfl:  .  omeprazole (PRILOSEC) 20 MG capsule, Take 20 mg by mouth daily as needed (Acid reflux)., Disp: , Rfl:  .  oxyCODONE (OXY IR/ROXICODONE) 5 MG immediate release tablet, Take 1 tablet (5 mg total) by mouth every 6 (six) hours as needed for severe pain., Disp: 15 tablet, Rfl: 0 .  polyethylene glycol (MIRALAX  / GLYCOLAX) 17 g packet, Take 17 g by mouth daily., Disp: 14 each, Rfl: 0 .  potassium chloride (KLOR-CON) 10 MEQ tablet, Take 1 tablet (10 mEq total) by mouth daily., Disp: 30 tablet, Rfl: 3 .  sacubitril-valsartan (ENTRESTO) 24-26 MG, Take 1 tablet by mouth 2 (two) times daily., Disp: 60 tablet, Rfl: 3 .  spironolactone (ALDACTONE) 25 MG tablet, Take 1 tablet (25 mg total) by mouth daily., Disp: 30 tablet, Rfl: 3 .  thiamine 100 MG tablet, Take 1 tablet (100 mg total) by mouth daily., Disp: 30 tablet, Rfl: 3 .  ticagrelor (BRILINTA) 90 MG TABS tablet, Take 1 tablet (90 mg total) by mouth 2 (two) times daily., Disp: 60 tablet, Rfl: 6 .  tiotropium (SPIRIVA) 18 MCG inhalation capsule, Place 18 mcg into inhaler and inhale daily., Disp: , Rfl:   Past Medical History: Past Medical History:  Diagnosis Date  . Arthritis   . COPD (chronic obstructive pulmonary disease) (HCC)   . Depression   . Diverticulitis   . Hyperlipidemia   . Hypertension   . Stroke [facility]) 2016   confirmed by CT scan     Tobacco Use: Social History   Tobacco Use  Smoking Status Current Some Day Smoker  . Packs/day: 0.25  . Years: 15.00  . Pack years: 3.75  . Types: Cigarettes  Smokeless Tobacco Former Neurosurgeon  Tobacco Comment   He smokes 2-4 Cigarettes a  day    Tommy Gaines is a current tobacco user. Intervention for tobacco cessation was provided at the initial medical review. He was asked about readiness to quit and reported he is ready to quit but has not set a quit date yet. He has decreased from 1/2 pack to about 4-5/ day. Patient was advised and educated about tobacco cessation using combination therapy, tobacco cessation classes, quit line, and quit smoking apps. Patient demonstrated understanding of this material. Staff will continue to provide encouragement and follow up with the patient throughout the program.    Labs: Recent Review Flowsheet Data    Labs for ITP Cardiac and Pulmonary Rehab Latest Ref Rng &  Units 01/30/2020 01/30/2020 01/31/2020 02/01/2020 02/01/2020   Cholestrol 0 - 200 mg/dL - - - - 542   LDLCALC 0 - 99 mg/dL - - - - 71   HDL >70 mg/dL - - - - 62(B)   Trlycerides <150 mg/dL - - 762 831 517   Hemoglobin A1c 4.8 - 5.6 % - - 5.4 - -   PHART 7.350 - 7.450 7.445 7.424 - - -   PCO2ART 32.0 - 48.0 mmHg 38.7 43.1 - - -   HCO3 20.0 - 28.0 mmol/L 26.7 28.3(H) - - -   TCO2 22 - 32 mmol/L 28 30 - - -   ACIDBASEDEF 0.0 - 2.0 mmol/L - - - - -   O2SAT % 83.0 98.0 - - -       Exercise Target Goals: Exercise Program Goal: Individual exercise prescription set using results from initial 6 min walk test and THRR while considering  patient's activity barriers and safety.   Exercise Prescription Goal: Initial exercise prescription builds to 30-45 minutes a day of aerobic activity, 2-3 days per week.  Home exercise guidelines will be given to patient during program as part of exercise prescription that the participant will acknowledge.   Education: Aerobic Exercise: - Group verbal and visual presentation on the components of exercise prescription. Introduces F.I.T.T principle from ACSM for exercise prescriptions.  Reviews F.I.T.T. principles of aerobic exercise including progression. Written material given at graduation.   Education: Resistance Exercise: - Group verbal and visual presentation on the components of exercise prescription. Introduces F.I.T.T principle from ACSM for exercise prescriptions  Reviews F.I.T.T. principles of resistance exercise including progression. Written material given at graduation.    Education: Exercise & Equipment Safety: - Individual verbal instruction and demonstration of equipment use and safety with use of the equipment. Flowsheet Row Cardiac Rehab from 03/01/2020 in [facility] Cardiac and Pulmonary Rehab  Date 03/01/20  Educator [facility]  Instruction Review Code 1- Verbalizes Understanding      Education: Exercise Physiology & General Exercise Guidelines: -  Group verbal and written instruction with models to review the exercise physiology of the cardiovascular system and associated critical values. Provides general exercise guidelines with specific guidelines to those with heart or lung disease.    Education: Flexibility, Balance, Mind/Body Relaxation: - Group verbal and visual presentation with interactive activity on the components of exercise prescription. Introduces F.I.T.T principle from ACSM for exercise prescriptions. Reviews F.I.T.T. principles of flexibility and balance exercise training including progression. Also discusses the mind body connection.  Reviews various relaxation techniques to help reduce and manage stress (i.e. Deep breathing, progressive muscle relaxation, and visualization). Balance handout provided to take home. Written material given at graduation.   Activity Barriers & Risk Stratification:  Activity Barriers & Cardiac Risk Stratification - 04/06/20 1641      Activity  Barriers & Cardiac Risk Stratification   Activity Barriers Shortness of Breath;Deconditioning    Cardiac Risk Stratification High           6 Minute Walk:  6 Minute Walk    Row Name 04/06/20 1635         6 Minute Walk   Phase Initial     Distance 715 feet     Walk Time 4.5 minutes  Break 2:32-4:08     # of Rest Breaks 1     MPH 1.8     METS 2.24     RPE 11     Perceived Dyspnea  3     VO2 Peak 7.84     Symptoms Yes (comment)     Comments SOB     Resting HR 85 bpm     Resting BP 122/68     Resting Oxygen Saturation  97 %     Exercise Oxygen Saturation  during 6 min walk 96 %     Max Ex. HR 114 bpm     Max Ex. BP 170/74     2 Minute Post BP 130/76            Oxygen Initial Assessment:   Oxygen Re-Evaluation:   Oxygen Discharge (Final Oxygen Re-Evaluation):   Initial Exercise Prescription:  Initial Exercise Prescription - 04/06/20 1600      Date of Initial Exercise RX and Referring Provider   Date 04/06/20    Referring  Provider Orpah Cobb MD      NuStep   Level 1    SPM 80    Minutes 15    METs 2.2      REL-XR   Level 1    Speed 50    Minutes 15    METs 2.2      Biostep-RELP   Level 1    SPM 50    Minutes 15    METs 2.2      Prescription Details   Frequency (times per week) 2    Duration Progress to 30 minutes of continuous aerobic without signs/symptoms of physical distress      Intensity   THRR 40-80% of Max Heartrate 116-148    Ratings of Perceived Exertion 11-13    Perceived Dyspnea 0-4      Progression   Progression Continue to progress workloads to maintain intensity without signs/symptoms of physical distress.      Resistance Training   Training Prescription Yes    Weight 3 lb    Reps 10-15           Perform Capillary Blood Glucose checks as needed.  Exercise Prescription Changes:  Exercise Prescription Changes    Row Name 04/06/20 1600             Response to Exercise   Blood Pressure (Admit) 122/68       Blood Pressure (Exercise) 170/74       Blood Pressure (Exit) 130/76       Heart Rate (Admit) 85 bpm       Heart Rate (Exercise) 114 bpm       Heart Rate (Exit) 91 bpm       Oxygen Saturation (Admit) 97 %       Oxygen Saturation (Exercise) 96 %       Oxygen Saturation (Exit) 97 %       Rating of Perceived Exertion (Exercise) 11       Perceived Dyspnea (Exercise) 3  Symptoms SOB       Comments walk test results              Exercise Comments:   Exercise Goals and Review:  Exercise Goals    Row Name 04/06/20 1651             Exercise Goals   Increase Physical Activity Yes       Intervention Provide advice, education, support and counseling about physical activity/exercise needs.;Develop an individualized exercise prescription for aerobic and resistive training based on initial evaluation findings, risk stratification, comorbidities and participant's personal goals.       Expected Outcomes Short Term: Attend rehab on a regular basis to  increase amount of physical activity.;Long Term: Add in home exercise to make exercise part of routine and to increase amount of physical activity.;Long Term: Exercising regularly at least 3-5 days a week.       Increase Strength and Stamina Yes       Intervention Provide advice, education, support and counseling about physical activity/exercise needs.;Develop an individualized exercise prescription for aerobic and resistive training based on initial evaluation findings, risk stratification, comorbidities and participant's personal goals.       Expected Outcomes Short Term: Increase workloads from initial exercise prescription for resistance, speed, and METs.;Short Term: Perform resistance training exercises routinely during rehab and add in resistance training at home;Long Term: Improve cardiorespiratory fitness, muscular endurance and strength as measured by increased METs and functional capacity ( )       Able to understand and use rate of perceived exertion (RPE) scale Yes       Intervention Provide education and explanation on how to use RPE scale       Expected Outcomes Short Term: Able to use RPE daily in rehab to express subjective intensity level;Long Term:  Able to use RPE to guide intensity level when exercising independently       Able to understand and use Dyspnea scale Yes       Intervention Provide education and explanation on how to use Dyspnea scale       Expected Outcomes Short Term: Able to use Dyspnea scale daily in rehab to express subjective sense of shortness of breath during exertion;Long Term: Able to use Dyspnea scale to guide intensity level when exercising independently       Knowledge and understanding of Target Heart Rate Range (THRR) Yes       Intervention Provide education and explanation of THRR including how the numbers were predicted and where they are located for reference       Expected Outcomes Short Term: Able to state/look up THRR;Short Term: Able to use daily  as guideline for intensity in rehab;Long Term: Able to use THRR to govern intensity when exercising independently       Able to check pulse independently Yes       Intervention Provide education and demonstration on how to check pulse in carotid and radial arteries.;Review the importance of being able to check your own pulse for safety during independent exercise       Expected Outcomes Short Term: Able to explain why pulse checking is important during independent exercise;Long Term: Able to check pulse independently and accurately       Understanding of Exercise Prescription Yes       Intervention Provide education, explanation, and written materials on patient's individual exercise prescription       Expected Outcomes Short Term: Able to explain program exercise prescription;Long Term: Able to  explain home exercise prescription to exercise independently              Exercise Goals Re-Evaluation :   Discharge Exercise Prescription (Final Exercise Prescription Changes):  Exercise Prescription Changes - 04/06/20 1600      Response to Exercise   Blood Pressure (Admit) 122/68    Blood Pressure (Exercise) 170/74    Blood Pressure (Exit) 130/76    Heart Rate (Admit) 85 bpm    Heart Rate (Exercise) 114 bpm    Heart Rate (Exit) 91 bpm    Oxygen Saturation (Admit) 97 %    Oxygen Saturation (Exercise) 96 %    Oxygen Saturation (Exit) 97 %    Rating of Perceived Exertion (Exercise) 11    Perceived Dyspnea (Exercise) 3    Symptoms SOB    Comments walk test results           Nutrition:  Target Goals: Understanding of nutrition guidelines, daily intake of sodium 1500mg , cholesterol 200mg , calories 30% from fat and 7% or less from saturated fats, daily to have 5 or more servings of fruits and vegetables.  Education: All About Nutrition: -Group instruction provided by verbal, written material, interactive activities, discussions, models, and posters to present general guidelines for heart  healthy nutrition including fat, fiber, MyPlate, the role of sodium in heart healthy nutrition, utilization of the nutrition label, and utilization of this knowledge for meal planning. Follow up email sent as well. Written material given at graduation.   Biometrics:  Pre Biometrics - 04/06/20 1640      Pre Biometrics   Height 6' 0.5" (1.842 m)    Weight 310 lb 12.8 oz (141 kg)    BMI (Calculated) 41.55            Nutrition Therapy Plan and Nutrition Goals:   Nutrition Assessments:  MEDIFICTS Score Key:  ?70 Need to make dietary changes   40-70 Heart Healthy Diet  ? 40 Therapeutic Level Cholesterol Diet   Picture Your Plate Scores:  <16 Unhealthy dietary pattern with much room for improvement.  41-50 Dietary pattern unlikely to meet recommendations for good health and room for improvement.  51-60 More healthful dietary pattern, with some room for improvement.   >60 Healthy dietary pattern, although there may be some specific behaviors that could be improved.    Nutrition Goals Re-Evaluation:   Nutrition Goals Discharge (Final Nutrition Goals Re-Evaluation):   Psychosocial: Target Goals: Acknowledge presence or absence of significant depression and/or stress, maximize coping skills, provide positive support system. Participant is able to verbalize types and ability to use techniques and skills needed for reducing stress and depression.   Education: Stress, Anxiety, and Depression - Group verbal and visual presentation to define topics covered.  Reviews how body is impacted by stress, anxiety, and depression.  Also discusses healthy ways to reduce stress and to treat/manage anxiety and depression.  Written material given at graduation.   Education: Sleep Hygiene -Provides group verbal and written instruction about how sleep can affect your health.  Define sleep hygiene, discuss sleep cycles and impact of sleep habits. Review good sleep hygiene tips.    Initial  Review & Psychosocial Screening:  Initial Psych Review & Screening - 03/01/20 1543      Initial Review   Current issues with Current Psychotropic Meds;Current Stress Concerns    Source of Stress Concerns Chronic Illness;Family;Financial    Comments He has a positive outlook on his health.      Family Dynamics  Good Support System? Yes    Comments He can look to his girlfriend and his mother for support.      Barriers   Psychosocial barriers to participate in program The patient should benefit from training in stress management and relaxation.      Screening Interventions   Interventions Encouraged to exercise;To provide support and resources with identified psychosocial needs;Provide feedback about the scores to participant    Expected Outcomes Short Term goal: Utilizing psychosocial counselor, staff and physician to assist with identification of specific Stressors or current issues interfering with healing process. Setting desired goal for each stressor or current issue identified.;Long Term Goal: Stressors or current issues are controlled or eliminated.;Short Term goal: Identification and review with participant of any Quality of Life or Depression concerns found by scoring the questionnaire.;Long Term goal: The participant improves quality of Life and PHQ9 Scores as seen by post scores and/or verbalization of changes           Quality of Life Scores:   Quality of Life - 03/20/20 1604      Quality of Life   Select Quality of Life      Quality of Life Scores   Health/Function Pre 6 %    Socioeconomic Pre 25.71 %    Psych/Spiritual Pre 14.57 %    Family Pre 27.6 %    GLOBAL Pre 15 %          Scores of 19 and below usually indicate a poorer quality of life in these areas.  A difference of  2-3 points is a clinically meaningful difference.  A difference of 2-3 points in the total score of the Quality of Life Index has been associated with significant improvement in overall  quality of life, self-image, physical symptoms, and general health in studies assessing change in quality of life.  PHQ-9: Recent Review Flowsheet Data    Depression screen [facility] 2/9 03/20/2020 03/10/2018 01/12/2018 10/27/2017 10/06/2017   Decreased Interest 3  0 0 0 0   Down, Depressed, Hopeless 3 0 0 0 1   PHQ - 2 Score 6 0 0 0 1   Altered sleeping 3 - - - -   Tired, decreased energy 3 - - - -   Change in appetite 3 - - - -   Feeling bad or failure about yourself  1 - - - -   Trouble concentrating 3 - - - -   Moving slowly or fidgety/restless 0 - - - -   Suicidal thoughts 0 - - - -   PHQ-9 Score 19 - - - -   Difficult doing work/chores Extremely dIfficult - - - -     Interpretation of Total Score  Total Score Depression Severity:  1-4 = Minimal depression, 5-9 = Mild depression, 10-14 = Moderate depression, 15-19 = Moderately severe depression, 20-27 = Severe depression   Psychosocial Evaluation and Intervention:  Psychosocial Evaluation - 03/01/20 1545      Psychosocial Evaluation & Interventions   Interventions Encouraged to exercise with the program and follow exercise prescription;Stress management education;Relaxation education    Comments He has a positive outlook on his health.He can look to his girlfriend and his mother for support.    Expected Outcomes Short: Exercise regularly to support mental health and notify staff of any changes. Long: maintain mental health and well being through teaching of rehab or prescribed medications independently.    Continue Psychosocial Services  Follow up required by staff  Psychosocial Re-Evaluation:   Psychosocial Discharge (Final Psychosocial Re-Evaluation):   Vocational Rehabilitation: Provide vocational rehab assistance to qualifying candidates.   Vocational Rehab Evaluation & Intervention:   Education: Education Goals: Education classes will be provided on a variety of topics geared toward better understanding of  heart health and risk factor modification. Participant will state understanding/return demonstration of topics presented as noted by education test scores.  Learning Barriers/Preferences:  Learning Barriers/Preferences - 03/01/20 1543      Learning Barriers/Preferences   Learning Barriers None    Learning Preferences None           General Cardiac Education Topics:  AED/CPR: - Group verbal and written instruction with the use of models to demonstrate the basic use of the AED with the basic ABC's of resuscitation.   Anatomy and Cardiac Procedures: - Group verbal and visual presentation and models provide information about basic cardiac anatomy and function. Reviews the testing methods done to diagnose heart disease and the outcomes of the test results. Describes the treatment choices: Medical Management, Angioplasty, or Coronary Bypass Surgery for treating various heart conditions including Myocardial Infarction, Angina, Valve Disease, and Cardiac Arrhythmias.  Written material given at graduation.   Medication Safety: - Group verbal and visual instruction to review commonly prescribed medications for heart and lung disease. Reviews the medication, class of the drug, and side effects. Includes the steps to properly store meds and maintain the prescription regimen.  Written material given at graduation.   Intimacy: - Group verbal instruction through game format to discuss how heart and lung disease can affect sexual intimacy. Written material given at graduation..   Know Your Numbers and Heart Failure: - Group verbal and visual instruction to discuss disease risk factors for cardiac and pulmonary disease and treatment options.  Reviews associated critical values for Overweight/Obesity, Hypertension, Cholesterol, and Diabetes.  Discusses basics of heart failure: signs/symptoms and treatments.  Introduces Heart Failure Zone chart for action plan for heart failure.  Written material given  at graduation.   Infection Prevention: - Provides verbal and written material to individual with discussion of infection control including proper hand washing and proper equipment cleaning during exercise session. Flowsheet Row Cardiac Rehab from 03/01/2020 in [facility] Cardiac and Pulmonary Rehab  Date 03/01/20  Educator [facility]  Instruction Review Code 1- Verbalizes Understanding      Falls Prevention: - Provides verbal and written material to individual with discussion of falls prevention and safety. Flowsheet Row Cardiac Rehab from 03/01/2020 in [facility] Cardiac and Pulmonary Rehab  Date 03/01/20  Educator [facility]  Instruction Review Code 1- Verbalizes Understanding      Other: -Provides group and verbal instruction on various topics (see comments)   Knowledge Questionnaire Score:  Knowledge Questionnaire Score - 03/20/20 1600      Knowledge Questionnaire Score   Pre Score 23/26: Angina, exercise, trans fat           Core Components/Risk Factors/Patient Goals at Admission:  Personal Goals and Risk Factors at Admission - 04/06/20 1651      Core Components/Risk Factors/Patient Goals on Admission    Weight Management Yes;Weight Loss    Intervention Weight Management: Develop a combined nutrition and exercise program designed to reach desired caloric intake, while maintaining appropriate intake of nutrient and fiber, sodium and fats, and appropriate energy expenditure required for the weight goal.;Weight Management: Provide education and appropriate resources to help participant work on and attain dietary goals.    Admit Weight 310 lb (140.6 kg)  Goal Weight: Short Term 305 lb (138.3 kg)    Goal Weight: Long Term 290 lb (131.5 kg)    Expected Outcomes Short Term: Continue to assess and modify interventions until short term weight is achieved;Long Term: Adherence to nutrition and physical activity/exercise program aimed toward attainment of established weight goal;Weight Loss: Understanding  of general recommendations for a balanced deficit meal plan, which promotes 1-2 lb weight loss per week and includes a negative energy balance of [phone] kcal/d;Understanding recommendations for meals to include 15-35% energy as protein, 25-35% energy from fat, 35-60% energy from carbohydrates, less than 200mg  of dietary cholesterol, 20-35 gm of total fiber daily;Understanding of distribution of calorie intake throughout the day with the consumption of 4-5 meals/snacks    Heart Failure Yes    Intervention Provide a combined exercise and nutrition program that is supplemented with education, support and counseling about heart failure. Directed toward relieving symptoms such as shortness of breath, decreased exercise tolerance, and extremity edema.    Expected Outcomes Improve functional capacity of life;Short term: Attendance in program 2-3 days a week with increased exercise capacity. Reported lower sodium intake. Reported increased fruit and vegetable intake. Reports medication compliance.;Short term: Daily weights obtained and reported for increase. Utilizing diuretic protocols set by physician.;Long term: Adoption of self-care skills and reduction of barriers for early signs and symptoms recognition and intervention leading to self-care maintenance.    Hypertension Yes    Intervention Provide education on lifestyle modifcations including regular physical activity/exercise, weight management, moderate sodium restriction and increased consumption of fresh fruit, vegetables, and low fat dairy, alcohol moderation, and smoking cessation.;Monitor prescription use compliance.    Expected Outcomes Short Term: Continued assessment and intervention until BP is < 140/3190mm HG in hypertensive participants. < 130/6080mm HG in hypertensive participants with diabetes, heart failure or chronic kidney disease.;Long Term: Maintenance of blood pressure at goal levels.    Lipids Yes    Intervention Provide education and  support for participant on nutrition & aerobic/resistive exercise along with prescribed medications to achieve LDL 70mg , HDL >40mg .    Expected Outcomes Short Term: Participant states understanding of desired cholesterol values and is compliant with medications prescribed. Participant is following exercise prescription and nutrition guidelines.;Long Term: Cholesterol controlled with medications as prescribed, with individualized exercise RX and with personalized nutrition plan. Value goals: LDL < 70mg , HDL > 40 mg.           Education:Diabetes - Individual verbal and written instruction to review signs/symptoms of diabetes, desired ranges of glucose level fasting, after meals and with exercise. Acknowledge that pre and post exercise glucose checks will be done for 3 sessions at entry of program.   Core Components/Risk Factors/Patient Goals Review:    Core Components/Risk Factors/Patient Goals at Discharge (Final Review):    ITP Comments:  ITP Comments    Row Name 03/01/20 1542 03/20/20 1537 04/06/20 1626 04/06/20 1654     ITP Comments Virtual Visit completed. Patient informed on EP and RD appointment and 6 Minute walk test. Patient also informed of patient health questionnaires on My Chart. Patient Verbalizes understanding. [facility]. Patient arrived to Rehab today, BP was 84/58. Patient asymptomatic besides feeling fatigued and SOB, which claims he has felt since his cardiac event in December. Patient's SO called doctor who advised to hold off on 2 medications tonight and will see doctor tomorrow morning. Patient is aware to call 911 if anything worsens or he feels worse. Completed 6MWT and gym  orientation. Initial ITP created and sent for review to Dr. Bethann Punches, Medical Director. Tommy Gaines is a current tobacco user. Intervention for tobacco cessation was provided at the initial medical review. He was asked about readiness to quit and reported he is ready to  quit but has not set a quit date yet. He has decreased from 1/2 pack to about 4-5/ day. Patient was advised and educated about tobacco cessation using combination therapy, tobacco cessation classes, quit line, and quit smoking apps. Patient demonstrated understanding of this material. Staff will continue to provide encouragement and follow up with the patient throughout the program.           Comments: Initial ITP

## 2020-04-06 NOTE — Patient Instructions (Signed)
Patient Instructions  Patient Details  Name: Tommy Gaines MRN: [id number] Date of Birth: 03-16-1965 Referring Provider:  Orpah Cobb, MD  Below are your personal goals for exercise, nutrition, and risk factors. Our goal is to help you stay on track towards obtaining and maintaining these goals. We will be discussing your progress on these goals with you throughout the program.  Initial Exercise Prescription:  Initial Exercise Prescription - 04/06/20 1600      Date of Initial Exercise RX and Referring Provider   Date 04/06/20    Referring Provider Orpah Cobb MD      NuStep   Level 1    SPM 80    Minutes 15    METs 2.2      REL-XR   Level 1    Speed 50    Minutes 15    METs 2.2      Biostep-RELP   Level 1    SPM 50    Minutes 15    METs 2.2      Prescription Details   Frequency (times per week) 2    Duration Progress to 30 minutes of continuous aerobic without signs/symptoms of physical distress      Intensity   THRR 40-80% of Max Heartrate 116-148    Ratings of Perceived Exertion 11-13    Perceived Dyspnea 0-4      Progression   Progression Continue to progress workloads to maintain intensity without signs/symptoms of physical distress.      Resistance Training   Training Prescription Yes    Weight 3 lb    Reps 10-15           Exercise Goals: Frequency: Be able to perform aerobic exercise two to three times per week in program working toward 2-5 days per week of home exercise.  Intensity: Work with a perceived exertion of 11 (fairly light) - 15 (hard) while following your exercise prescription.  We will make changes to your prescription with you as you progress through the program.   Duration: Be able to do 30 to 45 minutes of continuous aerobic exercise in addition to a 5 minute warm-up and a 5 minute cool-down routine.   Nutrition Goals: Your personal nutrition goals will be established when you do your nutrition analysis with the  dietician.  The following are general nutrition guidelines to follow: Cholesterol < 200mg /day Sodium < 1500mg /day Fiber: Men over 50 yrs - 30 grams per day  Personal Goals:  Personal Goals and Risk Factors at Admission - 04/06/20 1651      Core Components/Risk Factors/Patient Goals on Admission    Weight Management Yes;Weight Loss    Intervention Weight Management: Develop a combined nutrition and exercise program designed to reach desired caloric intake, while maintaining appropriate intake of nutrient and fiber, sodium and fats, and appropriate energy expenditure required for the weight goal.;Weight Management: Provide education and appropriate resources to help participant work on and attain dietary goals.    Admit Weight 310 lb (140.6 kg)    Goal Weight: Short Term 305 lb (138.3 kg)    Goal Weight: Long Term 290 lb (131.5 kg)    Expected Outcomes Short Term: Continue to assess and modify interventions until short term weight is achieved;Long Term: Adherence to nutrition and physical activity/exercise program aimed toward attainment of established weight goal;Weight Loss: Understanding of general recommendations for a balanced deficit meal plan, which promotes 1-2 lb weight loss per week and includes a negative energy balance of [phone] kcal/d;Understanding recommendations  for meals to include 15-35% energy as protein, 25-35% energy from fat, 35-60% energy from carbohydrates, less than 200mg  of dietary cholesterol, 20-35 gm of total fiber daily;Understanding of distribution of calorie intake throughout the day with the consumption of 4-5 meals/snacks    Heart Failure Yes    Intervention Provide a combined exercise and nutrition program that is supplemented with education, support and counseling about heart failure. Directed toward relieving symptoms such as shortness of breath, decreased exercise tolerance, and extremity edema.    Expected Outcomes Improve functional capacity of life;Short  term: Attendance in program 2-3 days a week with increased exercise capacity. Reported lower sodium intake. Reported increased fruit and vegetable intake. Reports medication compliance.;Short term: Daily weights obtained and reported for increase. Utilizing diuretic protocols set by physician.;Long term: Adoption of self-care skills and reduction of barriers for early signs and symptoms recognition and intervention leading to self-care maintenance.    Hypertension Yes    Intervention Provide education on lifestyle modifcations including regular physical activity/exercise, weight management, moderate sodium restriction and increased consumption of fresh fruit, vegetables, and low fat dairy, alcohol moderation, and smoking cessation.;Monitor prescription use compliance.    Expected Outcomes Short Term: Continued assessment and intervention until BP is < 140/39mm HG in hypertensive participants. < 130/28mm HG in hypertensive participants with diabetes, heart failure or chronic kidney disease.;Long Term: Maintenance of blood pressure at goal levels.    Lipids Yes    Intervention Provide education and support for participant on nutrition & aerobic/resistive exercise along with prescribed medications to achieve LDL 70mg , HDL >40mg .    Expected Outcomes Short Term: Participant states understanding of desired cholesterol values and is compliant with medications prescribed. Participant is following exercise prescription and nutrition guidelines.;Long Term: Cholesterol controlled with medications as prescribed, with individualized exercise RX and with personalized nutrition plan. Value goals: LDL < 70mg , HDL > 40 mg.           Tobacco Use Initial Evaluation: Social History   Tobacco Use  Smoking Status Current Some Day Smoker  . Packs/day: 0.25  . Years: 15.00  . Pack years: 3.75  . Types: Cigarettes  Smokeless Tobacco Former 91m  Tobacco Comment   He smokes 2-4 Cigarettes a day    Exercise Goals  and Review:  Exercise Goals    Row Name 04/06/20 1651             Exercise Goals   Increase Physical Activity Yes       Intervention Provide advice, education, support and counseling about physical activity/exercise needs.;Develop an individualized exercise prescription for aerobic and resistive training based on initial evaluation findings, risk stratification, comorbidities and participant's personal goals.       Expected Outcomes Short Term: Attend rehab on a regular basis to increase amount of physical activity.;Long Term: Add in home exercise to make exercise part of routine and to increase amount of physical activity.;Long Term: Exercising regularly at least 3-5 days a week.       Increase Strength and Stamina Yes       Intervention Provide advice, education, support and counseling about physical activity/exercise needs.;Develop an individualized exercise prescription for aerobic and resistive training based on initial evaluation findings, risk stratification, comorbidities and participant's personal goals.       Expected Outcomes Short Term: Increase workloads from initial exercise prescription for resistance, speed, and METs.;Short Term: Perform resistance training exercises routinely during rehab and add in resistance training at home;Long Term: Improve cardiorespiratory fitness, muscular endurance and  strength as measured by increased METs and functional capacity ( )       Able to understand and use rate of perceived exertion (RPE) scale Yes       Intervention Provide education and explanation on how to use RPE scale       Expected Outcomes Short Term: Able to use RPE daily in rehab to express subjective intensity level;Long Term:  Able to use RPE to guide intensity level when exercising independently       Able to understand and use Dyspnea scale Yes       Intervention Provide education and explanation on how to use Dyspnea scale       Expected Outcomes Short Term: Able to use  Dyspnea scale daily in rehab to express subjective sense of shortness of breath during exertion;Long Term: Able to use Dyspnea scale to guide intensity level when exercising independently       Knowledge and understanding of Target Heart Rate Range (THRR) Yes       Intervention Provide education and explanation of THRR including how the numbers were predicted and where they are located for reference       Expected Outcomes Short Term: Able to state/look up THRR;Short Term: Able to use daily as guideline for intensity in rehab;Long Term: Able to use THRR to govern intensity when exercising independently       Able to check pulse independently Yes       Intervention Provide education and demonstration on how to check pulse in carotid and radial arteries.;Review the importance of being able to check your own pulse for safety during independent exercise       Expected Outcomes Short Term: Able to explain why pulse checking is important during independent exercise;Long Term: Able to check pulse independently and accurately       Understanding of Exercise Prescription Yes       Intervention Provide education, explanation, and written materials on patient's individual exercise prescription       Expected Outcomes Short Term: Able to explain program exercise prescription;Long Term: Able to explain home exercise prescription to exercise independently              Copy of goals given to participant.

## 2020-04-12 ENCOUNTER — Encounter: Payer: Medicaid Other | Attending: Cardiovascular Disease

## 2020-04-12 ENCOUNTER — Encounter: Payer: Self-pay | Admitting: *Deleted

## 2020-04-12 DIAGNOSIS — Z955 Presence of coronary angioplasty implant and graft: Secondary | ICD-10-CM

## 2020-04-12 DIAGNOSIS — I214 Non-ST elevation (NSTEMI) myocardial infarction: Secondary | ICD-10-CM | POA: Insufficient documentation

## 2020-04-12 NOTE — Progress Notes (Signed)
Cardiac Individual Treatment Plan  Patient Details  Name: Tommy Gaines MRN: [id number] Date of Birth: 1965/12/09 Referring Provider:   Flowsheet Row Cardiac Rehab from 04/06/2020 in [facility] Cardiac and Pulmonary Rehab  Referring Provider Orpah Cobb MD      Initial Encounter Date:  Flowsheet Row Cardiac Rehab from 04/06/2020 in [facility] Cardiac and Pulmonary Rehab  Date 04/06/20      Visit Diagnosis: NSTEMI (non-ST elevated myocardial infarction) [facility])  Status post coronary artery stent placement  Patient's Home Medications on Admission:  Current Outpatient Medications:  .  albuterol (PROVENTIL HFA;VENTOLIN HFA) 108 (90 Base) MCG/ACT inhaler, Inhale 2 puffs into the lungs 3 (three) times daily., Disp: , Rfl:  .  aspirin EC 81 MG tablet, Take 81 mg by mouth., Disp: , Rfl:  .  atorvastatin (LIPITOR) 80 MG tablet, Take 1 tablet (80 mg total) by mouth daily., Disp: 30 tablet, Rfl: 3 .  budesonide-formoterol (SYMBICORT) 160-4.5 MCG/ACT inhaler, Inhale 1 puff into the lungs 2 (two) times daily., Disp: , Rfl:  .  carvedilol (COREG) 3.125 MG tablet, Take 1 tablet (3.125 mg total) by mouth 2 (two) times daily with a meal., Disp: 60 tablet, Rfl: 3 .  docusate sodium (COLACE) 100 MG capsule, Take 1 capsule (100 mg total) by mouth 2 (two) times daily., Disp: 10 capsule, Rfl: 0 .  FLUoxetine (PROZAC) 20 MG capsule, Take by mouth., Disp: , Rfl:  .  folic acid (FOLVITE) 1 MG tablet, Take 1 tablet (1 mg total) by mouth daily., Disp: 30 tablet, Rfl: 3 .  furosemide (LASIX) 20 MG tablet, Take 20 mg by mouth daily., Disp: , Rfl:  .  gabapentin (NEURONTIN) 300 MG capsule, Take by mouth., Disp: , Rfl:  .  omeprazole (PRILOSEC) 20 MG capsule, Take 20 mg by mouth daily as needed (Acid reflux)., Disp: , Rfl:  .  oxyCODONE (OXY IR/ROXICODONE) 5 MG immediate release tablet, Take 1 tablet (5 mg total) by mouth every 6 (six) hours as needed for severe pain., Disp: 15 tablet, Rfl: 0 .  polyethylene glycol (MIRALAX  / GLYCOLAX) 17 g packet, Take 17 g by mouth daily., Disp: 14 each, Rfl: 0 .  potassium chloride (KLOR-CON) 10 MEQ tablet, Take 1 tablet (10 mEq total) by mouth daily., Disp: 30 tablet, Rfl: 3 .  sacubitril-valsartan (ENTRESTO) 24-26 MG, Take 1 tablet by mouth 2 (two) times daily., Disp: 60 tablet, Rfl: 3 .  spironolactone (ALDACTONE) 25 MG tablet, Take 1 tablet (25 mg total) by mouth daily., Disp: 30 tablet, Rfl: 3 .  thiamine 100 MG tablet, Take 1 tablet (100 mg total) by mouth daily., Disp: 30 tablet, Rfl: 3 .  ticagrelor (BRILINTA) 90 MG TABS tablet, Take 1 tablet (90 mg total) by mouth 2 (two) times daily., Disp: 60 tablet, Rfl: 6 .  tiotropium (SPIRIVA) 18 MCG inhalation capsule, Place 18 mcg into inhaler and inhale daily., Disp: , Rfl:   Past Medical History: Past Medical History:  Diagnosis Date  . Arthritis   . COPD (chronic obstructive pulmonary disease) (HCC)   . Depression   . Diverticulitis   . Hyperlipidemia   . Hypertension   . Stroke [facility]) 2016   confirmed by CT scan     Tobacco Use: Social History   Tobacco Use  Smoking Status Current Some Day Smoker  . Packs/day: 0.25  . Years: 15.00  . Pack years: 3.75  . Types: Cigarettes  Smokeless Tobacco Former Neurosurgeon  Tobacco Comment   He smokes 2-4 Cigarettes a  day    Labs: Recent Review Flowsheet Data    Labs for ITP Cardiac and Pulmonary Rehab Latest Ref Rng & Units 01/30/2020 01/30/2020 01/31/2020 02/01/2020 02/01/2020   Cholestrol 0 - 200 mg/dL - - - - 161127   LDLCALC 0 - 99 mg/dL - - - - 71   HDL >09>40 mg/dL - - - - 60(A28(L)   Trlycerides <150 mg/dL - - [id number]   Hemoglobin A1c 4.8 - 5.6 % - - 5.4 - -   PHART 7.350 - 7.450 7.445 7.424 - - -   PCO2ART 32.0 - 48.0 mmHg 38.7 43.1 - - -   HCO3 20.0 - 28.0 mmol/L 26.7 28.3(H) - - -   TCO2 22 - 32 mmol/L 28 30 - - -   ACIDBASEDEF 0.0 - 2.0 mmol/L - - - - -   O2SAT % 83.0 98.0 - - -       Exercise Target Goals: Exercise Program Goal: Individual exercise  prescription set using results from initial 6 min walk test and THRR while considering  patient's activity barriers and safety.   Exercise Prescription Goal: Initial exercise prescription builds to 30-45 minutes a day of aerobic activity, 2-3 days per week.  Home exercise guidelines will be given to patient during program as part of exercise prescription that the participant will acknowledge.   Education: Aerobic Exercise: - Group verbal and visual presentation on the components of exercise prescription. Introduces F.I.T.T principle from ACSM for exercise prescriptions.  Reviews F.I.T.T. principles of aerobic exercise including progression. Written material given at graduation.   Education: Resistance Exercise: - Group verbal and visual presentation on the components of exercise prescription. Introduces F.I.T.T principle from ACSM for exercise prescriptions  Reviews F.I.T.T. principles of resistance exercise including progression. Written material given at graduation.    Education: Exercise & Equipment Safety: - Individual verbal instruction and demonstration of equipment use and safety with use of the equipment. Flowsheet Row [facility]  Date 03/01/20  [facility]  Instruction Review Code 1- Verbalizes Understanding      Education: Exercise Physiology & General Exercise Guidelines: - Group verbal and written instruction with models to review the exercise physiology of the cardiovascular system and associated critical values. Provides general exercise guidelines with specific guidelines to those with heart or lung disease.    Education: Flexibility, Balance, Mind/Body Relaxation: - Group verbal and visual presentation with interactive activity on the components of exercise prescription. Introduces F.I.T.T principle from ACSM for exercise prescriptions. Reviews F.I.T.T. principles of flexibility and balance exercise training including  progression. Also discusses the mind body connection.  Reviews various relaxation techniques to help reduce and manage stress (i.e. Deep breathing, progressive muscle relaxation, and visualization). Balance handout provided to take home. Written material given at graduation.   Activity Barriers & Risk Stratification:  Activity Barriers & Cardiac Risk Stratification - 04/06/20 1641      Activity Barriers & Cardiac Risk Stratification   Activity Barriers Shortness of Breath;Deconditioning    Cardiac Risk Stratification High           6 Minute Walk:  6 Minute Walk    Row Name 04/06/20 1635         6 Minute Walk   Phase Initial     Distance 715 feet     Walk Time 4.5 minutes  Break 2:32-4:08     # of Rest Breaks 1     MPH 1.8  METS 2.24     RPE 11     Perceived Dyspnea  3     VO2 Peak 7.84     Symptoms Yes (comment)     Comments SOB     Resting HR 85 bpm     Resting BP 122/68     Resting Oxygen Saturation  97 %     Exercise Oxygen Saturation  during 6 min walk 96 %     Max Ex. HR 114 bpm     Max Ex. BP 170/74     2 Minute Post BP 130/76            Oxygen Initial Assessment:   Oxygen Re-Evaluation:   Oxygen Discharge (Final Oxygen Re-Evaluation):   Initial Exercise Prescription:  Initial Exercise Prescription - 04/06/20 1600      Date of Initial Exercise RX and Referring Provider   Date 04/06/20    Referring Provider Orpah Cobb MD      NuStep   Level 1    SPM 80    Minutes 15    METs 2.2      REL-XR   Level 1    Speed 50    Minutes 15    METs 2.2      Biostep-RELP   Level 1    SPM 50    Minutes 15    METs 2.2      Prescription Details   Frequency (times per week) 2    Duration Progress to 30 minutes of continuous aerobic without signs/symptoms of physical distress      Intensity   THRR 40-80% of Max Heartrate 116-148    Ratings of Perceived Exertion 11-13    Perceived Dyspnea 0-4      Progression   Progression Continue to  progress workloads to maintain intensity without signs/symptoms of physical distress.      Resistance Training   Training Prescription Yes    Weight 3 lb    Reps 10-15           Perform Capillary Blood Glucose checks as needed.  Exercise Prescription Changes:  Exercise Prescription Changes    Row Name 04/06/20 1600             Response to Exercise   Blood Pressure (Admit) 122/68       Blood Pressure (Exercise) 170/74       Blood Pressure (Exit) 130/76       Heart Rate (Admit) 85 bpm       Heart Rate (Exercise) 114 bpm       Heart Rate (Exit) 91 bpm       Oxygen Saturation (Admit) 97 %       Oxygen Saturation (Exercise) 96 %       Oxygen Saturation (Exit) 97 %       Rating of Perceived Exertion (Exercise) 11       Perceived Dyspnea (Exercise) 3       Symptoms SOB       Comments walk test results              Exercise Comments:   Exercise Goals and Review:  Exercise Goals    Row Name 04/06/20 1651             Exercise Goals   Increase Physical Activity Yes       Intervention Provide advice, education, support and counseling about physical activity/exercise needs.;Develop an individualized exercise prescription for aerobic and resistive training based on initial  evaluation findings, risk stratification, comorbidities and participant's personal goals.       Expected Outcomes Short Term: Attend rehab on a regular basis to increase amount of physical activity.;Long Term: Add in home exercise to make exercise part of routine and to increase amount of physical activity.;Long Term: Exercising regularly at least 3-5 days a week.       Increase Strength and Stamina Yes       Intervention Provide advice, education, support and counseling about physical activity/exercise needs.;Develop an individualized exercise prescription for aerobic and resistive training based on initial evaluation findings, risk stratification, comorbidities and participant's personal goals.        Expected Outcomes Short Term: Increase workloads from initial exercise prescription for resistance, speed, and METs.;Short Term: Perform resistance training exercises routinely during rehab and add in resistance training at home;Long Term: Improve cardiorespiratory fitness, muscular endurance and strength as measured by increased METs and functional capacity ( )       Able to understand and use rate of perceived exertion (RPE) scale Yes       Intervention Provide education and explanation on how to use RPE scale       Expected Outcomes Short Term: Able to use RPE daily in rehab to express subjective intensity level;Long Term:  Able to use RPE to guide intensity level when exercising independently       Able to understand and use Dyspnea scale Yes       Intervention Provide education and explanation on how to use Dyspnea scale       Expected Outcomes Short Term: Able to use Dyspnea scale daily in rehab to express subjective sense of shortness of breath during exertion;Long Term: Able to use Dyspnea scale to guide intensity level when exercising independently       Knowledge and understanding of Target Heart Rate Range (THRR) Yes       Intervention Provide education and explanation of THRR including how the numbers were predicted and where they are located for reference       Expected Outcomes Short Term: Able to state/look up THRR;Short Term: Able to use daily as guideline for intensity in rehab;Long Term: Able to use THRR to govern intensity when exercising independently       Able to check pulse independently Yes       Intervention Provide education and demonstration on how to check pulse in carotid and radial arteries.;Review the importance of being able to check your own pulse for safety during independent exercise       Expected Outcomes Short Term: Able to explain why pulse checking is important during independent exercise;Long Term: Able to check pulse independently and accurately        Understanding of Exercise Prescription Yes       Intervention Provide education, explanation, and written materials on patient's individual exercise prescription       Expected Outcomes Short Term: Able to explain program exercise prescription;Long Term: Able to explain home exercise prescription to exercise independently              Exercise Goals Re-Evaluation :   Discharge Exercise Prescription (Final Exercise Prescription Changes):  Exercise Prescription Changes - 04/06/20 1600      Response to Exercise   Blood Pressure (Admit) 122/68    Blood Pressure (Exercise) 170/74    Blood Pressure (Exit) 130/76    Heart Rate (Admit) 85 bpm    Heart Rate (Exercise) 114 bpm    Heart Rate (Exit) 91 bpm  Oxygen Saturation (Admit) 97 %    Oxygen Saturation (Exercise) 96 %    Oxygen Saturation (Exit) 97 %    Rating of Perceived Exertion (Exercise) 11    Perceived Dyspnea (Exercise) 3    Symptoms SOB    Comments walk test results           Nutrition:  Target Goals: Understanding of nutrition guidelines, daily intake of sodium 1500mg , cholesterol 200mg , calories 30% from fat and 7% or less from saturated fats, daily to have 5 or more servings of fruits and vegetables.  Education: All About Nutrition: -Group instruction provided by verbal, written material, interactive activities, discussions, models, and posters to present general guidelines for heart healthy nutrition including fat, fiber, MyPlate, the role of sodium in heart healthy nutrition, utilization of the nutrition label, and utilization of this knowledge for meal planning. Follow up email sent as well. Written material given at graduation.   Biometrics:  Pre Biometrics - 04/06/20 1640      Pre Biometrics   Height 6' 0.5" (1.842 m)    Weight 310 lb 12.8 oz (141 kg)    BMI (Calculated) 41.55            Nutrition Therapy Plan and Nutrition Goals:   Nutrition Assessments:  MEDIFICTS Score Key:  ?70 Need to  make dietary changes   40-70 Heart Healthy Diet  ? 40 Therapeutic Level Cholesterol Diet   Picture Your Plate Scores:  <59 Unhealthy dietary pattern with much room for improvement.  41-50 Dietary pattern unlikely to meet recommendations for good health and room for improvement.  51-60 More healthful dietary pattern, with some room for improvement.   >60 Healthy dietary pattern, although there may be some specific behaviors that could be improved.    Nutrition Goals Re-Evaluation:   Nutrition Goals Discharge (Final Nutrition Goals Re-Evaluation):   Psychosocial: Target Goals: Acknowledge presence or absence of significant depression and/or stress, maximize coping skills, provide positive support system. Participant is able to verbalize types and ability to use techniques and skills needed for reducing stress and depression.   Education: Stress, Anxiety, and Depression - Group verbal and visual presentation to define topics covered.  Reviews how body is impacted by stress, anxiety, and depression.  Also discusses healthy ways to reduce stress and to treat/manage anxiety and depression.  Written material given at graduation.   Education: Sleep Hygiene -Provides group verbal and written instruction about how sleep can affect your health.  Define sleep hygiene, discuss sleep cycles and impact of sleep habits. Review good sleep hygiene tips.    Initial Review & Psychosocial Screening:  Initial Psych Review & Screening - 03/01/20 1543      Initial Review   Current issues with Current Psychotropic Meds;Current Stress Concerns    Source of Stress Concerns Chronic Illness;Family;Financial    Comments He has a positive outlook on his health.      Family Dynamics   Good Support System? Yes    Comments He can look to his girlfriend and his mother for support.      Barriers   Psychosocial barriers to participate in program The patient should benefit from training in stress  management and relaxation.      Screening Interventions   Interventions Encouraged to exercise;To provide support and resources with identified psychosocial needs;Provide feedback about the scores to participant    Expected Outcomes Short Term goal: Utilizing psychosocial counselor, staff and physician to assist with identification of specific Stressors or current issues  interfering with healing process. Setting desired goal for each stressor or current issue identified.;Long Term Goal: Stressors or current issues are controlled or eliminated.;Short Term goal: Identification and review with participant of any Quality of Life or Depression concerns found by scoring the questionnaire.;Long Term goal: The participant improves quality of Life and PHQ9 Scores as seen by post scores and/or verbalization of changes           Quality of Life Scores:   Quality of Life - 03/20/20 1604      Quality of Life   Select Quality of Life      Quality of Life Scores   Health/Function Pre 6 %    Socioeconomic Pre 25.71 %    Psych/Spiritual Pre 14.57 %    Family Pre 27.6 %    GLOBAL Pre 15 %          Scores of 19 and below usually indicate a poorer quality of life in these areas.  A difference of  2-3 points is a clinically meaningful difference.  A difference of 2-3 points in the total score of the Quality of Life Index has been associated with significant improvement in overall quality of life, self-image, physical symptoms, and general health in studies assessing change in quality of life.  PHQ-9: Recent Review Flowsheet Data    Depression screen [facility] 2/9 03/20/2020 03/10/2018 01/12/2018 10/27/2017 10/06/2017   Decreased Interest 3  0 0 0 0   Down, Depressed, Hopeless 3 0 0 0 1   PHQ - 2 Score 6 0 0 0 1   Altered sleeping 3 - - - -   Tired, decreased energy 3 - - - -   Change in appetite 3 - - - -   Feeling bad or failure about yourself  1 - - - -   Trouble concentrating 3 - - - -   Moving slowly or  fidgety/restless 0 - - - -   Suicidal thoughts 0 - - - -   PHQ-9 Score 19 - - - -   Difficult doing work/chores Extremely dIfficult - - - -     Interpretation of Total Score  Total Score Depression Severity:  1-4 = Minimal depression, 5-9 = Mild depression, 10-14 = Moderate depression, 15-19 = Moderately severe depression, 20-27 = Severe depression   Psychosocial Evaluation and Intervention:  Psychosocial Evaluation - 03/01/20 1545      Psychosocial Evaluation & Interventions   Interventions Encouraged to exercise with the program and follow exercise prescription;Stress management education;Relaxation education    Comments He has a positive outlook on his health.He can look to his girlfriend and his mother for support.    Expected Outcomes Short: Exercise regularly to support mental health and notify staff of any changes. Long: maintain mental health and well being through teaching of rehab or prescribed medications independently.    Continue Psychosocial Services  Follow up required by staff           Psychosocial Re-Evaluation:   Psychosocial Discharge (Final Psychosocial Re-Evaluation):   Vocational Rehabilitation: Provide vocational rehab assistance to qualifying candidates.   Vocational Rehab Evaluation & Intervention:   Education: Education Goals: Education classes will be provided on a variety of topics geared toward better understanding of heart health and risk factor modification. Participant will state understanding/return demonstration of topics presented as noted by education test scores.  Learning Barriers/Preferences:  Learning Barriers/Preferences - 03/01/20 1543      Learning Barriers/Preferences   Learning Barriers None  Learning Preferences None           General Cardiac Education Topics:  AED/CPR: - Group verbal and written instruction with the use of models to demonstrate the basic use of the AED with the basic ABC's of  resuscitation.   Anatomy and Cardiac Procedures: - Group verbal and visual presentation and models provide information about basic cardiac anatomy and function. Reviews the testing methods done to diagnose heart disease and the outcomes of the test results. Describes the treatment choices: Medical Management, Angioplasty, or Coronary Bypass Surgery for treating various heart conditions including Myocardial Infarction, Angina, Valve Disease, and Cardiac Arrhythmias.  Written material given at graduation.   Medication Safety: - Group verbal and visual instruction to review commonly prescribed medications for heart and lung disease. Reviews the medication, class of the drug, and side effects. Includes the steps to properly store meds and maintain the prescription regimen.  Written material given at graduation.   Intimacy: - Group verbal instruction through game format to discuss how heart and lung disease can affect sexual intimacy. Written material given at graduation..   Know Your Numbers and Heart Failure: - Group verbal and visual instruction to discuss disease risk factors for cardiac and pulmonary disease and treatment options.  Reviews associated critical values for Overweight/Obesity, Hypertension, Cholesterol, and Diabetes.  Discusses basics of heart failure: signs/symptoms and treatments.  Introduces Heart Failure Zone chart for action plan for heart failure.  Written material given at graduation.   Infection Prevention: - Provides verbal and written material to individual with discussion of infection control including proper hand washing and proper equipment cleaning during exercise session. Flowsheet Row Cardiac Rehab from 03/01/2020 in [facility] Cardiac and Pulmonary Rehab  Date 03/01/20  Educator [facility]  Instruction Review Code 1- Verbalizes Understanding      Falls Prevention: - Provides verbal and written material to individual with discussion of falls prevention and  safety. Flowsheet Row Cardiac Rehab from 03/01/2020 in [facility] Cardiac and Pulmonary Rehab  Date 03/01/20  Educator [facility]  Instruction Review Code 1- Verbalizes Understanding      Other: -Provides group and verbal instruction on various topics (see comments)   Knowledge Questionnaire Score:  Knowledge Questionnaire Score - 03/20/20 1600      Knowledge Questionnaire Score   Pre Score 23/26: Angina, exercise, trans fat           Core Components/Risk Factors/Patient Goals at Admission:  Personal Goals and Risk Factors at Admission - 04/06/20 1651      Core Components/Risk Factors/Patient Goals on Admission    Weight Management Yes;Weight Loss    Intervention Weight Management: Develop a combined nutrition and exercise program designed to reach desired caloric intake, while maintaining appropriate intake of nutrient and fiber, sodium and fats, and appropriate energy expenditure required for the weight goal.;Weight Management: Provide education and appropriate resources to help participant work on and attain dietary goals.    Admit Weight 310 lb (140.6 kg)    Goal Weight: Short Term 305 lb (138.3 kg)    Goal Weight: Long Term 290 lb (131.5 kg)    Expected Outcomes Short Term: Continue to assess and modify interventions until short term weight is achieved;Long Term: Adherence to nutrition and physical activity/exercise program aimed toward attainment of established weight goal;Weight Loss: Understanding of general recommendations for a balanced deficit meal plan, which promotes 1-2 lb weight loss per week and includes a negative energy balance of [phone] kcal/d;Understanding recommendations for meals to include 15-35% energy as protein,  25-35% energy from fat, 35-60% energy from carbohydrates, less than  of dietary cholesterol, 20-35 gm of total fiber daily;Understanding of distribution of calorie intake throughout the day with the consumption of 4-5 meals/snacks    Heart Failure Yes     Intervention Provide a combined exercise and nutrition program that is supplemented with education, support and counseling about heart failure. Directed toward relieving symptoms such as shortness of breath, decreased exercise tolerance, and extremity edema.    Expected Outcomes Improve functional capacity of life;Short term: Attendance in program 2-3 days a week with increased exercise capacity. Reported lower sodium intake. Reported increased fruit and vegetable intake. Reports medication compliance.;Short term: Daily weights obtained and reported for increase. Utilizing diuretic protocols set by physician.;Long term: Adoption of self-care skills and reduction of barriers for early signs and symptoms recognition and intervention leading to self-care maintenance.    Hypertension Yes    Intervention Provide education on lifestyle modifcations including regular physical activity/exercise, weight management, moderate sodium restriction and increased consumption of fresh fruit, vegetables, and low fat dairy, alcohol moderation, and smoking cessation.;Monitor prescription use compliance.    Expected Outcomes Short Term: Continued assessment and intervention until BP is < 140/72mm HG in hypertensive participants. < 130/61mm HG in hypertensive participants with diabetes, heart failure or chronic kidney disease.;Long Term: Maintenance of blood pressure at goal levels.    Lipids Yes    Intervention Provide education and support for participant on nutrition & aerobic/resistive exercise along with prescribed medications to achieve LDL 70mg , HDL >40mg .    Expected Outcomes Short Term: Participant states understanding of desired cholesterol values and is compliant with medications prescribed. Participant is following exercise prescription and nutrition guidelines.;Long Term: Cholesterol controlled with medications as prescribed, with individualized exercise RX and with personalized nutrition plan. Value goals: LDL <  , HDL > 40 mg.           Education:Diabetes - Individual verbal and written instruction to review signs/symptoms of diabetes, desired ranges of glucose level fasting, after meals and with exercise. Acknowledge that pre and post exercise glucose checks will be done for 3 sessions at entry of program.   Core Components/Risk Factors/Patient Goals Review:    Core Components/Risk Factors/Patient Goals at Discharge (Final Review):    ITP Comments:  ITP Comments    Row Name 03/01/20 1542 03/20/20 1537 04/06/20 1626 04/06/20 1654 04/12/20 0557   ITP Comments Virtual Visit completed. Patient informed on EP and RD appointment and 6 Minute walk test. Patient also informed of patient health questionnaires on My Chart. Patient Verbalizes understanding. Visit diagnosis can be found in [facility]/08/2020. Patient arrived to Rehab today, BP was 84/58. Patient asymptomatic besides feeling fatigued and SOB, which claims he has felt since his cardiac event in December. Patient's SO called doctor who advised to hold off on 2 medications tonight and will see doctor tomorrow morning. Patient is aware to call 911 if anything worsens or he feels worse. Completed and gym orientation. Initial ITP created and sent for review to Dr. Bethann Punches, Medical Director. [name] is a current tobacco user. Intervention for tobacco cessation was provided at the initial medical review. He was asked about readiness to quit and reported he is ready to quit but has not set a quit date yet. He has decreased from 1/2 pack to about 4-5/ day. Patient was advised and educated about tobacco cessation using combination therapy, tobacco cessation classes, quit line, and quit smoking apps. Patient demonstrated understanding of this material. Staff  will continue to provide encouragement and follow up with the patient throughout the program. 30 Day review completed. Medical Director ITP review done, changes made as directed, and signed approval  by Medical Director.          Comments:

## 2020-04-18 ENCOUNTER — Telehealth: Payer: Self-pay

## 2020-04-18 NOTE — Telephone Encounter (Signed)
Attempted to call patient regarding Cardiac Rehab, family member picked up the phone and said he was not home. Left message asking for call back.

## 2020-04-19 ENCOUNTER — Encounter: Payer: Medicaid Other | Attending: Cardiovascular Disease

## 2020-04-19 DIAGNOSIS — I214 Non-ST elevation (NSTEMI) myocardial infarction: Secondary | ICD-10-CM | POA: Insufficient documentation

## 2020-04-19 DIAGNOSIS — Z955 Presence of coronary angioplasty implant and graft: Secondary | ICD-10-CM | POA: Insufficient documentation

## 2020-04-24 DIAGNOSIS — Z955 Presence of coronary angioplasty implant and graft: Secondary | ICD-10-CM

## 2020-04-24 DIAGNOSIS — I214 Non-ST elevation (NSTEMI) myocardial infarction: Secondary | ICD-10-CM

## 2020-04-24 NOTE — Telephone Encounter (Signed)
Followed up with patient- states he had a lot of appointments this week and would like to push his start date for Heart Track for next Monday, 3/14. Patient agreed and confirmed time.

## 2020-05-01 ENCOUNTER — Other Ambulatory Visit: Payer: Self-pay

## 2020-05-01 DIAGNOSIS — I214 Non-ST elevation (NSTEMI) myocardial infarction: Secondary | ICD-10-CM | POA: Diagnosis not present

## 2020-05-01 DIAGNOSIS — Z955 Presence of coronary angioplasty implant and graft: Secondary | ICD-10-CM

## 2020-05-01 NOTE — Progress Notes (Signed)
Daily Session Note  Patient Details  Name: Tommy Gaines MRN: [id number] Date of Birth: 1965/05/30 Referring Provider:   Flowsheet Row Cardiac Rehab from 04/06/2020 in [facility]  Referring Provider Dixie Dials MD      Encounter Date: 05/01/2020  Check In:  Session Check In - 05/01/20 1549      Check-In   Supervising physician immediately available to respond to emergencies See telemetry face sheet for immediately available ER MD    Location [facility]-Cardiac & Pulmonary Rehab    Staff Present Birdie Sons, MPA, Mauricia Area, BS, ACSM CEP, Exercise Physiologist;Kara Eliezer Bottom, MS Exercise Physiologist    Virtual Visit No    Medication changes reported     No    Fall or balance concerns reported    No    Tobacco Cessation Use Increase    Current number of cigarettes/nicotine per day     -1    Warm-up and Cool-down Performed on first and last piece of equipment    Resistance Training Performed Yes    VAD Patient? No    PAD/SET Patient? No      Pain Assessment   Currently in Pain? No/denies              Social History   Tobacco Use  Smoking Status Current Some Day Smoker  . Packs/day: 0.25  . Years: 15.00  . Pack years: 3.75  . Types: Cigarettes  Smokeless Tobacco Former Systems developer  Tobacco Comment   He smokes 2-4 Cigarettes a day    Goals Met:  Independence with exercise equipment Exercise tolerated well No report of cardiac concerns or symptoms Strength training completed today  Goals Unmet:  Not Applicable  Comments: First full day of exercise!  Patient was oriented to gym and equipment including functions, settings, policies, and procedures.  Patient's individual exercise prescription and treatment plan were reviewed.  All starting workloads were established based on the results of the 6 minute walk test done at initial orientation visit.  The plan for exercise progression was also introduced and progression will be customized based on  patient's performance and goals.    Dr. Emily Filbert is Medical Director for [place] and [facility].

## 2020-05-03 ENCOUNTER — Other Ambulatory Visit: Payer: Self-pay

## 2020-05-03 DIAGNOSIS — I214 Non-ST elevation (NSTEMI) myocardial infarction: Secondary | ICD-10-CM | POA: Diagnosis not present

## 2020-05-03 DIAGNOSIS — Z955 Presence of coronary angioplasty implant and graft: Secondary | ICD-10-CM

## 2020-05-03 NOTE — Progress Notes (Signed)
Daily Session Note  Patient Details  Name: Tommy Gaines MRN: [id number] Date of Birth: 1965-10-17 Referring Provider:   Flowsheet Row Cardiac Rehab from 04/06/2020 in [facility]  Referring Provider Dixie Dials MD      Encounter Date: 05/03/2020  Check In:  Session Check In - 05/03/20 1541      Check-In   Supervising physician immediately available to respond to emergencies See telemetry face sheet for immediately available ER MD    Location [facility] & Pulmonary Rehab    Staff Present Birdie Sons, MPA, Nino Glow, MS Exercise Physiologist    Virtual Visit No    Medication changes reported     No    Fall or balance concerns reported    No    Tobacco Cessation No Change    Warm-up and Cool-down Performed on first and last piece of equipment    Resistance Training Performed Yes    VAD Patient? No    PAD/SET Patient? No      Pain Assessment   Currently in Pain? No/denies              Social History   Tobacco Use  Smoking Status Current Some Day Smoker  . Packs/day: 0.25  . Years: 15.00  . Pack years: 3.75  . Types: Cigarettes  Smokeless Tobacco Former Systems developer  Tobacco Comment   He smokes 2-4 Cigarettes a day    Goals Met:  Independence with exercise equipment Exercise tolerated well No report of cardiac concerns or symptoms Strength training completed today  Goals Unmet:  Not Applicable  Comments: Pt able to follow exercise prescription today without complaint.  Will continue to monitor for progression.    Dr. Emily Filbert is Medical Director for [place] and [facility].

## 2020-05-10 ENCOUNTER — Encounter: Payer: Self-pay | Admitting: *Deleted

## 2020-05-10 DIAGNOSIS — I214 Non-ST elevation (NSTEMI) myocardial infarction: Secondary | ICD-10-CM

## 2020-05-10 NOTE — Progress Notes (Signed)
Cardiac Individual Treatment Plan  Patient Details  Name: Tommy Gaines MRN: [id number] Date of Birth: 09/28/1965 Referring Provider:   Flowsheet Row Cardiac Rehab from 04/06/2020 in [facility] Cardiac and Pulmonary Rehab  Referring Provider Orpah Cobb MD      Initial Encounter Date:  Flowsheet Row Cardiac Rehab from 04/06/2020 in [facility] Cardiac and Pulmonary Rehab  Date 04/06/20      Visit Diagnosis: NSTEMI (non-ST elevated myocardial infarction) [facility])  Patient's Home Medications on Admission:  Current Outpatient Medications:  .  albuterol (PROVENTIL HFA;VENTOLIN HFA) 108 (90 Base) MCG/ACT inhaler, Inhale 2 puffs into the lungs 3 (three) times daily., Disp: , Rfl:  .  aspirin EC 81 MG tablet, Take 81 mg by mouth., Disp: , Rfl:  .  atorvastatin (LIPITOR) 80 MG tablet, Take 1 tablet (80 mg total) by mouth daily., Disp: 30 tablet, Rfl: 3 .  budesonide-formoterol (SYMBICORT) 160-4.5 MCG/ACT inhaler, Inhale 1 puff into the lungs 2 (two) times daily., Disp: , Rfl:  .  carvedilol (COREG) 3.125 MG tablet, Take 1 tablet (3.125 mg total) by mouth 2 (two) times daily with a meal., Disp: 60 tablet, Rfl: 3 .  docusate sodium (COLACE) 100 MG capsule, Take 1 capsule (100 mg total) by mouth 2 (two) times daily., Disp: 10 capsule, Rfl: 0 .  FLUoxetine (PROZAC) 20 MG capsule, Take by mouth., Disp: , Rfl:  .  folic acid (FOLVITE) 1 MG tablet, Take 1 tablet (1 mg total) by mouth daily., Disp: 30 tablet, Rfl: 3 .  furosemide (LASIX) 20 MG tablet, Take 20 mg by mouth daily., Disp: , Rfl:  .  gabapentin (NEURONTIN) 300 MG capsule, Take by mouth., Disp: , Rfl:  .  omeprazole (PRILOSEC) 20 MG capsule, Take 20 mg by mouth daily as needed (Acid reflux)., Disp: , Rfl:  .  oxyCODONE (OXY IR/ROXICODONE) 5 MG immediate release tablet, Take 1 tablet (5 mg total) by mouth every 6 (six) hours as needed for severe pain., Disp: 15 tablet, Rfl: 0 .  polyethylene glycol (MIRALAX / GLYCOLAX) 17 g packet, Take 17 g by mouth  daily., Disp: 14 each, Rfl: 0 .  potassium chloride (KLOR-CON) 10 MEQ tablet, Take 1 tablet (10 mEq total) by mouth daily., Disp: 30 tablet, Rfl: 3 .  sacubitril-valsartan (ENTRESTO) 24-26 MG, Take 1 tablet by mouth 2 (two) times daily., Disp: 60 tablet, Rfl: 3 .  spironolactone (ALDACTONE) 25 MG tablet, Take 1 tablet (25 mg total) by mouth daily., Disp: 30 tablet, Rfl: 3 .  thiamine 100 MG tablet, Take 1 tablet (100 mg total) by mouth daily., Disp: 30 tablet, Rfl: 3 .  ticagrelor (BRILINTA) 90 MG TABS tablet, Take 1 tablet (90 mg total) by mouth 2 (two) times daily., Disp: 60 tablet, Rfl: 6 .  tiotropium (SPIRIVA) 18 MCG inhalation capsule, Place 18 mcg into inhaler and inhale daily., Disp: , Rfl:   Past Medical History: Past Medical History:  Diagnosis Date  . Arthritis   . COPD (chronic obstructive pulmonary disease) (HCC)   . Depression   . Diverticulitis   . Hyperlipidemia   . Hypertension   . Stroke [facility]) 2016   confirmed by CT scan     Tobacco Use: Social History   Tobacco Use  Smoking Status Current Some Day Smoker  . Packs/day: 0.25  . Years: 15.00  . Pack years: 3.75  . Types: Cigarettes  Smokeless Tobacco Former Neurosurgeon  Tobacco Comment   He smokes 2-4 Cigarettes a day    Labs: Recent Review  Flowsheet Data    Labs for ITP Cardiac and Pulmonary Rehab Latest Ref Rng & Units 01/30/2020 01/30/2020 01/31/2020 02/01/2020 02/01/2020   Cholestrol 0 - 200 mg/dL - - - - 478   LDLCALC 0 - 99 mg/dL - - - - 71   HDL >29 mg/dL - - - - 56(O)   Trlycerides <150 mg/dL - - 130 865 784   Hemoglobin A1c 4.8 - 5.6 % - - 5.4 - -   PHART 7.350 - 7.450 7.445 7.424 - - -   PCO2ART 32.0 - 48.0 mmHg 38.7 43.1 - - -   HCO3 20.0 - 28.0 mmol/L 26.7 28.3(H) - - -   TCO2 22 - 32 mmol/L 28 30 - - -   ACIDBASEDEF 0.0 - 2.0 mmol/L - - - - -   O2SAT % 83.0 98.0 - - -       Exercise Target Goals: Exercise Program Goal: Individual exercise prescription set using results from initial 6 min  walk test and THRR while considering  patient's activity barriers and safety.   Exercise Prescription Goal: Initial exercise prescription builds to 30-45 minutes a day of aerobic activity, 2-3 days per week.  Home exercise guidelines will be given to patient during program as part of exercise prescription that the participant will acknowledge.   Education: Aerobic Exercise: - Group verbal and visual presentation on the components of exercise prescription. Introduces F.I.T.T principle from ACSM for exercise prescriptions.  Reviews F.I.T.T. principles of aerobic exercise including progression. Written material given at graduation.   Education: Resistance Exercise: - Group verbal and visual presentation on the components of exercise prescription. Introduces F.I.T.T principle from ACSM for exercise prescriptions  Reviews F.I.T.T. principles of resistance exercise including progression. Written material given at graduation.    Education: Exercise & Equipment Safety: - Individual verbal instruction and demonstration of equipment use and safety with use of the equipment. Flowsheet Row Cardiac Rehab from 05/03/2020 in [facility] Cardiac and Pulmonary Rehab  Date 03/01/20  Educator [facility]  Instruction Review Code 1- Verbalizes Understanding      Education: Exercise Physiology & General Exercise Guidelines: - Group verbal and written instruction with models to review the exercise physiology of the cardiovascular system and associated critical values. Provides general exercise guidelines with specific guidelines to those with heart or lung disease.    Education: Flexibility, Balance, Mind/Body Relaxation: - Group verbal and visual presentation with interactive activity on the components of exercise prescription. Introduces F.I.T.T principle from ACSM for exercise prescriptions. Reviews F.I.T.T. principles of flexibility and balance exercise training including progression. Also discusses the mind body  connection.  Reviews various relaxation techniques to help reduce and manage stress (i.e. Deep breathing, progressive muscle relaxation, and visualization). Balance handout provided to take home. Written material given at graduation.   Activity Barriers & Risk Stratification:  Activity Barriers & Cardiac Risk Stratification - 04/06/20 1641      Activity Barriers & Cardiac Risk Stratification   Activity Barriers Shortness of Breath;Deconditioning    Cardiac Risk Stratification High           6 Minute Walk:  6 Minute Walk    Row Name 04/06/20 1635         6 Minute Walk   Phase Initial     Distance 715 feet     Walk Time 4.5 minutes  Break 2:32-4:08     # of Rest Breaks 1     MPH 1.8     METS 2.24  RPE 11     Perceived Dyspnea  3     VO2 Peak 7.84     Symptoms Yes (comment)     Comments SOB     Resting HR 85 bpm     Resting BP 122/68     Resting Oxygen Saturation  97 %     Exercise Oxygen Saturation  during 6 min walk 96 %     Max Ex. HR 114 bpm     Max Ex. BP 170/74     2 Minute Post BP 130/76            Oxygen Initial Assessment:   Oxygen Re-Evaluation:   Oxygen Discharge (Final Oxygen Re-Evaluation):   Initial Exercise Prescription:  Initial Exercise Prescription - 04/06/20 1600      Date of Initial Exercise RX and Referring Provider   Date 04/06/20    Referring Provider Orpah Cobb MD      NuStep   Level 1    SPM 80    Minutes 15    METs 2.2      REL-XR   Level 1    Speed 50    Minutes 15    METs 2.2      Biostep-RELP   Level 1    SPM 50    Minutes 15    METs 2.2      Prescription Details   Frequency (times per week) 2    Duration Progress to 30 minutes of continuous aerobic without signs/symptoms of physical distress      Intensity   THRR 40-80% of Max Heartrate 116-148    Ratings of Perceived Exertion 11-13    Perceived Dyspnea 0-4      Progression   Progression Continue to progress workloads to maintain intensity without  signs/symptoms of physical distress.      Resistance Training   Training Prescription Yes    Weight 3 lb    Reps 10-15           Perform Capillary Blood Glucose checks as needed.  Exercise Prescription Changes:  Exercise Prescription Changes    Row Name 04/06/20 1600 05/08/20 0800           Response to Exercise   Blood Pressure (Admit) 122/68 134/84      Blood Pressure (Exercise) 170/74 136/80      Blood Pressure (Exit) 130/76 122/68      Heart Rate (Admit) 85 bpm 64 bpm      Heart Rate (Exercise) 114 bpm 93 bpm      Heart Rate (Exit) 91 bpm 77 bpm      Oxygen Saturation (Admit) 97 % --      Oxygen Saturation (Exercise) 96 % --      Oxygen Saturation (Exit) 97 % --      Rating of Perceived Exertion (Exercise) 11 14      Perceived Dyspnea (Exercise) 3 --      Symptoms SOB SOB      Comments walk test results second full day of exercise      Duration -- Progress to 30 minutes of  aerobic without signs/symptoms of physical distress      Intensity -- THRR unchanged             Progression   Progression -- Continue to progress workloads to maintain intensity without signs/symptoms of physical distress.      Average METs -- 1.6  Resistance Training   Training Prescription -- Yes      Weight -- 3 lb      Reps -- 10-15             Interval Training   Interval Training -- No             NuStep   Level -- 1      Minutes -- 15      METs -- 2             REL-XR   Level -- 1      Minutes -- 15      METs -- 1.2             Exercise Comments:  Exercise Comments    Row Name 05/01/20 1551           Exercise Comments First full day of exercise!  Patient was oriented to gym and equipment including functions, settings, policies, and procedures.  Patient's individual exercise prescription and treatment plan were reviewed.  All starting workloads were established based on the results of the 6 minute walk test done at initial orientation visit.  The plan  for exercise progression was also introduced and progression will be customized based on patient's performance and goals.              Exercise Goals and Review:  Exercise Goals    Row Name 04/06/20 1651             Exercise Goals   Increase Physical Activity Yes       Intervention Provide advice, education, support and counseling about physical activity/exercise needs.;Develop an individualized exercise prescription for aerobic and resistive training based on initial evaluation findings, risk stratification, comorbidities and participant's personal goals.       Expected Outcomes Short Term: Attend rehab on a regular basis to increase amount of physical activity.;Long Term: Add in home exercise to make exercise part of routine and to increase amount of physical activity.;Long Term: Exercising regularly at least 3-5 days a week.       Increase Strength and Stamina Yes       Intervention Provide advice, education, support and counseling about physical activity/exercise needs.;Develop an individualized exercise prescription for aerobic and resistive training based on initial evaluation findings, risk stratification, comorbidities and participant's personal goals.       Expected Outcomes Short Term: Increase workloads from initial exercise prescription for resistance, speed, and METs.;Short Term: Perform resistance training exercises routinely during rehab and add in resistance training at home;Long Term: Improve cardiorespiratory fitness, muscular endurance and strength as measured by increased METs and functional capacity ( )       Able to understand and use rate of perceived exertion (RPE) scale Yes       Intervention Provide education and explanation on how to use RPE scale       Expected Outcomes Short Term: Able to use RPE daily in rehab to express subjective intensity level;Long Term:  Able to use RPE to guide intensity level when exercising independently       Able to understand and  use Dyspnea scale Yes       Intervention Provide education and explanation on how to use Dyspnea scale       Expected Outcomes Short Term: Able to use Dyspnea scale daily in rehab to express subjective sense of shortness of breath during exertion;Long Term: Able to use Dyspnea scale to guide intensity level when exercising independently  Knowledge and understanding of Target Heart Rate Range (THRR) Yes       Intervention Provide education and explanation of THRR including how the numbers were predicted and where they are located for reference       Expected Outcomes Short Term: Able to state/look up THRR;Short Term: Able to use daily as guideline for intensity in rehab;Long Term: Able to use THRR to govern intensity when exercising independently       Able to check pulse independently Yes       Intervention Provide education and demonstration on how to check pulse in carotid and radial arteries.;Review the importance of being able to check your own pulse for safety during independent exercise       Expected Outcomes Short Term: Able to explain why pulse checking is important during independent exercise;Long Term: Able to check pulse independently and accurately       Understanding of Exercise Prescription Yes       Intervention Provide education, explanation, and written materials on patient's individual exercise prescription       Expected Outcomes Short Term: Able to explain program exercise prescription;Long Term: Able to explain home exercise prescription to exercise independently              Exercise Goals Re-Evaluation :  Exercise Goals Re-Evaluation    Row Name 05/01/20 1551 05/08/20 0807           Exercise Goal Re-Evaluation   Exercise Goals Review Increase Physical Activity;Able to understand and use rate of perceived exertion (RPE) scale;Knowledge and understanding of Target Heart Rate Range (THRR);Understanding of Exercise Prescription;Increase Strength and Stamina;Able to  understand and use Dyspnea scale;Able to check pulse independently Increase Physical Activity;Increase Strength and Stamina;Understanding of Exercise Prescription      Comments Reviewed RPE and dyspnea scales, THR and program prescription with pt today.  Pt voiced understanding and was given a copy of goals to take home. [name] has only completed his first two full days of exercise.  We will continue to monitor his progress.      Expected Outcomes Short: Use RPE daily to regulate intensity. Long: Follow program prescription in THR. Short: Continue to attend rehab regularly Long: Continue to follow program prescription.             Discharge Exercise Prescription (Final Exercise Prescription Changes):  Exercise Prescription Changes - 05/08/20 0800      Response to Exercise   Blood Pressure (Admit) 134/84    Blood Pressure (Exercise) 136/80    Blood Pressure (Exit) 122/68    Heart Rate (Admit) 64 bpm    Heart Rate (Exercise) 93 bpm    Heart Rate (Exit) 77 bpm    Rating of Perceived Exertion (Exercise) 14    Symptoms SOB    Comments second full day of exercise    Duration Progress to 30 minutes of  aerobic without signs/symptoms of physical distress    Intensity THRR unchanged      Progression   Progression Continue to progress workloads to maintain intensity without signs/symptoms of physical distress.    Average METs 1.6      Resistance Training   Training Prescription Yes    Weight 3 lb    Reps 10-15      Interval Training   Interval Training No      NuStep   Level 1    Minutes 15    METs 2      REL-XR   Level 1  Minutes 15    METs 1.2           Nutrition:  Target Goals: Understanding of nutrition guidelines, daily intake of sodium 1500mg , cholesterol 200mg , calories 30% from fat and 7% or less from saturated fats, daily to have 5 or more servings of fruits and vegetables.  Education: All About Nutrition: -Group instruction provided by verbal, written  material, interactive activities, discussions, models, and posters to present general guidelines for heart healthy nutrition including fat, fiber, MyPlate, the role of sodium in heart healthy nutrition, utilization of the nutrition label, and utilization of this knowledge for meal planning. Follow up email sent as well. Written material given at graduation.   Biometrics:  Pre Biometrics - 05/01/20 1742      Pre Biometrics   Single Leg Stand 13.18 seconds            Nutrition Therapy Plan and Nutrition Goals:  Nutrition Therapy & Goals - 05/03/20 1644      Nutrition Therapy   Diet Heart healthy, low Na    Drug/Food Interactions Statins/Certain Fruits    Protein (specify units) 100g    Fiber 30 grams    Whole Grain Foods 3 servings    Saturated Fats 12 max. grams    Fruits and Vegetables 8 servings/day    Sodium 1.5 grams      Personal Nutrition Goals   Nutrition Goal ST: add 2 servings per day of fruits and vegetables he enjoys already LT: limit saturated fat <16g, have at least 5 servings of fruits/vegetables per day, have at least 1 serving of whole grains per day    Comments [name] and his girlfriend have previously tried to eat healthy cold [place], however, the changes made did not last long due to this. They tried things like switching to canola oil, switching to mrs dash and salt substitutes, diet soda. [name] did not enjoy the foods. He doesn't care for most fruits and vegetables besides corn, melons, and greens - encouraged to eat the vegetables he does enjoys. He eats captin crunch cereal - recommended a whole grain cereal and or having fruit with it. Girlfriend reports wanting to try different vegetable preparations such as broccoli and brussels sprouts and that she thinks he would like it, he will not try it. [name] was not very vocal and active in the conversation, his girlfriend di most of the talking. Discussed heart healthy eating. Advised not to use salt substitues as they  have significant amounts of potassium which is not recommended due to his heart failure.      Intervention Plan   Intervention Prescribe, educate and counsel regarding individualized specific dietary modifications aiming towards targeted core components such as weight, hypertension, lipid management, diabetes, heart failure and other comorbidities.;Nutrition handout(s) given to patient.    Expected Outcomes Short Term Goal: Understand basic principles of dietary content, such as calories, fat, sodium, cholesterol and nutrients.;Short Term Goal: A plan has been developed with personal nutrition goals set during dietitian appointment.;Long Term Goal: Adherence to prescribed nutrition plan.           Nutrition Assessments:  MEDIFICTS Score Key:  ?70 Need to make dietary changes   40-70 Heart Healthy Diet  ? 40 Therapeutic Level Cholesterol Diet  Flowsheet Row Cardiac Rehab from 05/03/2020 in [facility] Cardiac and Pulmonary Rehab  Picture Your Plate Total Score on Admission 42     Picture Your Plate Scores:  <16 Unhealthy dietary pattern with much room for improvement.  41-50  Dietary pattern unlikely to meet recommendations for good health and room for improvement.  51-60 More healthful dietary pattern, with some room for improvement.   >60 Healthy dietary pattern, although there may be some specific behaviors that could be improved.    Nutrition Goals Re-Evaluation:   Nutrition Goals Discharge (Final Nutrition Goals Re-Evaluation):   Psychosocial: Target Goals: Acknowledge presence or absence of significant depression and/or stress, maximize coping skills, provide positive support system. Participant is able to verbalize types and ability to use techniques and skills needed for reducing stress and depression.   Education: Stress, Anxiety, and Depression - Group verbal and visual presentation to define topics covered.  Reviews how body is impacted by stress, anxiety, and  depression.  Also discusses healthy ways to reduce stress and to treat/manage anxiety and depression.  Written material given at graduation.   Education: Sleep Hygiene -Provides group verbal and written instruction about how sleep can affect your health.  Define sleep hygiene, discuss sleep cycles and impact of sleep habits. Review good sleep hygiene tips.    Initial Review & Psychosocial Screening:  Initial Psych Review & Screening - 03/01/20 1543      Initial Review   Current issues with Current Psychotropic Meds;Current Stress Concerns    Source of Stress Concerns Chronic Illness;Family;Financial    Comments He has a positive outlook on his health.      Family Dynamics   Good Support System? Yes    Comments He can look to his girlfriend and his mother for support.      Barriers   Psychosocial barriers to participate in program The patient should benefit from training in stress management and relaxation.      Screening Interventions   Interventions Encouraged to exercise;To provide support and resources with identified psychosocial needs;Provide feedback about the scores to participant    Expected Outcomes Short Term goal: Utilizing psychosocial counselor, staff and physician to assist with identification of specific Stressors or current issues interfering with healing process. Setting desired goal for each stressor or current issue identified.;Long Term Goal: Stressors or current issues are controlled or eliminated.;Short Term goal: Identification and review with participant of any Quality of Life or Depression concerns found by scoring the questionnaire.;Long Term goal: The participant improves quality of Life and PHQ9 Scores as seen by post scores and/or verbalization of changes           Quality of Life Scores:   Quality of Life - 03/20/20 1604      Quality of Life   Select Quality of Life      Quality of Life Scores   Health/Function Pre 6 %    Socioeconomic Pre 25.71 %     Psych/Spiritual Pre 14.57 %    Family Pre 27.6 %    GLOBAL Pre 15 %          Scores of 19 and below usually indicate a poorer quality of life in these areas.  A difference of  2-3 points is a clinically meaningful difference.  A difference of 2-3 points in the total score of the Quality of Life Index has been associated with significant improvement in overall quality of life, self-image, physical symptoms, and general health in studies assessing change in quality of life.  PHQ-9: Recent Review Flowsheet Data    Depression screen [facility] 2/9 03/20/2020 03/10/2018 01/12/2018 10/27/2017 10/06/2017   Decreased Interest 3  0 0 0 0   Down, Depressed, Hopeless 3 0 0 0 1  PHQ - 2 Score 6 0 0 0 1   Altered sleeping 3 - - - -   Tired, decreased energy 3 - - - -   Change in appetite 3 - - - -   Feeling bad or failure about yourself  1 - - - -   Trouble concentrating 3 - - - -   Moving slowly or fidgety/restless 0 - - - -   Suicidal thoughts 0 - - - -   PHQ-9 Score 19 - - - -   Difficult doing work/chores Extremely dIfficult - - - -     Interpretation of Total Score  Total Score Depression Severity:  1-4 = Minimal depression, 5-9 = Mild depression, 10-14 = Moderate depression, 15-19 = Moderately severe depression, 20-27 = Severe depression   Psychosocial Evaluation and Intervention:  Psychosocial Evaluation - 03/01/20 1545      Psychosocial Evaluation & Interventions   Interventions Encouraged to exercise with the program and follow exercise prescription;Stress management education;Relaxation education    Comments He has a positive outlook on his health.He can look to his girlfriend and his mother for support.    Expected Outcomes Short: Exercise regularly to support mental health and notify staff of any changes. Long: maintain mental health and well being through teaching of rehab or prescribed medications independently.    Continue Psychosocial Services  Follow up required by staff            Psychosocial Re-Evaluation:   Psychosocial Discharge (Final Psychosocial Re-Evaluation):   Vocational Rehabilitation: Provide vocational rehab assistance to qualifying candidates.   Vocational Rehab Evaluation & Intervention:   Education: Education Goals: Education classes will be provided on a variety of topics geared toward better understanding of heart health and risk factor modification. Participant will state understanding/return demonstration of topics presented as noted by education test scores.  Learning Barriers/Preferences:  Learning Barriers/Preferences - 03/01/20 1543      Learning Barriers/Preferences   Learning Barriers None    Learning Preferences None           General Cardiac Education Topics:  AED/CPR: - Group verbal and written instruction with the use of models to demonstrate the basic use of the AED with the basic ABC's of resuscitation.   Anatomy and Cardiac Procedures: - Group verbal and visual presentation and models provide information about basic cardiac anatomy and function. Reviews the testing methods done to diagnose heart disease and the outcomes of the test results. Describes the treatment choices: Medical Management, Angioplasty, or Coronary Bypass Surgery for treating various heart conditions including Myocardial Infarction, Angina, Valve Disease, and Cardiac Arrhythmias.  Written material given at graduation.   Medication Safety: - Group verbal and visual instruction to review commonly prescribed medications for heart and lung disease. Reviews the medication, class of the drug, and side effects. Includes the steps to properly store meds and maintain the prescription regimen.  Written material given at graduation. Flowsheet Row Cardiac Rehab from 05/03/2020 in [facility]  Date 05/03/20  Educator [facility]  Instruction Review Code 1- Verbalizes Understanding      Intimacy: - Group verbal instruction through game format  to discuss how heart and lung disease can affect sexual intimacy. Written material given at graduation..   Know Your Numbers and Heart Failure: - Group verbal and visual instruction to discuss disease risk factors for cardiac and pulmonary disease and treatment options.  Reviews associated critical values for Overweight/Obesity, Hypertension, Cholesterol, and Diabetes.  Discusses basics of  heart failure: signs/symptoms and treatments.  Introduces Heart Failure Zone chart for action plan for heart failure.  Written material given at graduation.   Infection Prevention: - Provides verbal and written material to individual with discussion of infection control including proper hand washing and proper equipment cleaning during exercise session. Flowsheet Row Cardiac Rehab from 05/03/2020 in [facility] Cardiac and Pulmonary Rehab  Date 03/01/20  Educator [facility]  Instruction Review Code 1- Verbalizes Understanding      Falls Prevention: - Provides verbal and written material to individual with discussion of falls prevention and safety. Flowsheet Row Cardiac Rehab from 05/03/2020 in [facility] Cardiac and Pulmonary Rehab  Date 03/01/20  Educator [facility]  Instruction Review Code 1- Verbalizes Understanding      Other: -Provides group and verbal instruction on various topics (see comments)   Knowledge Questionnaire Score:  Knowledge Questionnaire Score - 03/20/20 1600      Knowledge Questionnaire Score   Pre Score 23/26: Angina, exercise, trans fat           Core Components/Risk Factors/Patient Goals at Admission:  Personal Goals and Risk Factors at Admission - 04/06/20 1651      Core Components/Risk Factors/Patient Goals on Admission    Weight Management Yes;Weight Loss    Intervention Weight Management: Develop a combined nutrition and exercise program designed to reach desired caloric intake, while maintaining appropriate intake of nutrient and fiber, sodium and fats, and appropriate energy expenditure  required for the weight goal.;Weight Management: Provide education and appropriate resources to help participant work on and attain dietary goals.    Admit Weight 310 lb (140.6 kg)    Goal Weight: Short Term 305 lb (138.3 kg)    Goal Weight: Long Term 290 lb (131.5 kg)    Expected Outcomes Short Term: Continue to assess and modify interventions until short term weight is achieved;Long Term: Adherence to nutrition and physical activity/exercise program aimed toward attainment of established weight goal;Weight Loss: Understanding of general recommendations for a balanced deficit meal plan, which promotes 1-2 lb weight loss per week and includes a negative energy balance of [phone] kcal/d;Understanding recommendations for meals to include 15-35% energy as protein, 25-35% energy from fat, 35-60% energy from carbohydrates, less than  of dietary cholesterol, 20-35 gm of total fiber daily;Understanding of distribution of calorie intake throughout the day with the consumption of 4-5 meals/snacks    Heart Failure Yes    Intervention Provide a combined exercise and nutrition program that is supplemented with education, support and counseling about heart failure. Directed toward relieving symptoms such as shortness of breath, decreased exercise tolerance, and extremity edema.    Expected Outcomes Improve functional capacity of life;Short term: Attendance in program 2-3 days a week with increased exercise capacity. Reported lower sodium intake. Reported increased fruit and vegetable intake. Reports medication compliance.;Short term: Daily weights obtained and reported for increase. Utilizing diuretic protocols set by physician.;Long term: Adoption of self-care skills and reduction of barriers for early signs and symptoms recognition and intervention leading to self-care maintenance.    Hypertension Yes    Intervention Provide education on lifestyle modifcations including regular physical activity/exercise, weight  management, moderate sodium restriction and increased consumption of fresh fruit, vegetables, and low fat dairy, alcohol moderation, and smoking cessation.;Monitor prescription use compliance.    Expected Outcomes Short Term: Continued assessment and intervention until BP is < 140/16mm HG in hypertensive participants. < 130/46mm HG in hypertensive participants with diabetes, heart failure or chronic kidney disease.;Long Term: Maintenance of blood pressure at  goal levels.    Lipids Yes    Intervention Provide education and support for participant on nutrition & aerobic/resistive exercise along with prescribed medications to achieve LDL 70mg , HDL >40mg .    Expected Outcomes Short Term: Participant states understanding of desired cholesterol values and is compliant with medications prescribed. Participant is following exercise prescription and nutrition guidelines.;Long Term: Cholesterol controlled with medications as prescribed, with individualized exercise RX and with personalized nutrition plan. Value goals: LDL < , HDL > 40 mg.           Education:Diabetes - Individual verbal and written instruction to review signs/symptoms of diabetes, desired ranges of glucose level fasting, after meals and with exercise. Acknowledge that pre and post exercise glucose checks will be done for 3 sessions at entry of program.   Core Components/Risk Factors/Patient Goals Review:    Core Components/Risk Factors/Patient Goals at Discharge (Final Review):    ITP Comments:  ITP Comments    Row Name 03/01/20 1542 03/20/20 1537 04/06/20 1626 04/06/20 1654 04/12/20 0557   ITP Comments Virtual Visit completed. Patient informed on EP and RD appointment and 6 Minute walk test. Patient also informed of patient health questionnaires on My Chart. Patient Verbalizes understanding. Visit diagnosis can be found in [facility] 02/08/2020. Patient arrived to Rehab today, BP was 84/58. Patient asymptomatic besides feeling fatigued  and SOB, which claims he has felt since his cardiac event in December. Patient's SO called doctor who advised to hold off on 2 medications tonight and will see doctor tomorrow morning. Patient is aware to call 911 if anything worsens or he feels worse. Completed and gym orientation. Initial ITP created and sent for review to Dr. Bethann Punches, Medical Director. [name] is a current tobacco user. Intervention for tobacco cessation was provided at the initial medical review. He was asked about readiness to quit and reported he is ready to quit but has not set a quit date yet. He has decreased from 1/2 pack to about 4-5/ day. Patient was advised and educated about tobacco cessation using combination therapy, tobacco cessation classes, quit line, and quit smoking apps. Patient demonstrated understanding of this material. Staff will continue to provide encouragement and follow up with the patient throughout the program. 30 Day review completed. Medical Director ITP review done, changes made as directed, and signed approval by Medical Director.   Row Name 04/24/20 1836 05/01/20 1551 05/10/20 0700       ITP Comments Followed up with patient- states he had a lot of appointments this week and would like to push his start date for Heart Track for next Monday, 3/14. Patient agreed and confirmed time. First full day of exercise!  Patient was oriented to gym and equipment including functions, settings, policies, and procedures.  Patient's individual exercise prescription and treatment plan were reviewed.  All starting workloads were established based on the results of the 6 minute walk test done at initial orientation visit.  The plan for exercise progression was also introduced and progression will be customized based on patient's performance and goals. 30 Day review completed. Medical Director ITP review done, changes made as directed, and signed approval by Medical Director.            Comments:

## 2020-05-22 ENCOUNTER — Telehealth: Payer: Self-pay

## 2020-05-22 ENCOUNTER — Encounter: Payer: Medicaid Other | Attending: Cardiovascular Disease

## 2020-05-22 DIAGNOSIS — I214 Non-ST elevation (NSTEMI) myocardial infarction: Secondary | ICD-10-CM | POA: Insufficient documentation

## 2020-05-22 DIAGNOSIS — Z955 Presence of coronary angioplasty implant and graft: Secondary | ICD-10-CM | POA: Insufficient documentation

## 2020-05-22 NOTE — Telephone Encounter (Signed)
lmom 

## 2020-06-07 ENCOUNTER — Encounter: Payer: Self-pay | Admitting: *Deleted

## 2020-06-07 DIAGNOSIS — I214 Non-ST elevation (NSTEMI) myocardial infarction: Secondary | ICD-10-CM

## 2020-06-07 DIAGNOSIS — Z955 Presence of coronary angioplasty implant and graft: Secondary | ICD-10-CM

## 2020-06-07 NOTE — Progress Notes (Signed)
Cardiac Individual Treatment Plan  Patient Details  Name: Tommy Gaines MRN: [id number] Date of Birth: 1965/12/09 Referring Provider:   Flowsheet Row Cardiac Rehab from 04/06/2020 in [facility] Cardiac and Pulmonary Rehab  Referring Provider Orpah Cobb MD      Initial Encounter Date:  Flowsheet Row Cardiac Rehab from 04/06/2020 in [facility] Cardiac and Pulmonary Rehab  Date 04/06/20      Visit Diagnosis: NSTEMI (non-ST elevated myocardial infarction) [facility])  Status post coronary artery stent placement  Patient's Home Medications on Admission:  Current Outpatient Medications:  .  albuterol (PROVENTIL HFA;VENTOLIN HFA) 108 (90 Base) MCG/ACT inhaler, Inhale 2 puffs into the lungs 3 (three) times daily., Disp: , Rfl:  .  aspirin EC 81 MG tablet, Take 81 mg by mouth., Disp: , Rfl:  .  atorvastatin (LIPITOR) 80 MG tablet, Take 1 tablet (80 mg total) by mouth daily., Disp: 30 tablet, Rfl: 3 .  budesonide-formoterol (SYMBICORT) 160-4.5 MCG/ACT inhaler, Inhale 1 puff into the lungs 2 (two) times daily., Disp: , Rfl:  .  carvedilol (COREG) 3.125 MG tablet, Take 1 tablet (3.125 mg total) by mouth 2 (two) times daily with a meal., Disp: 60 tablet, Rfl: 3 .  docusate sodium (COLACE) 100 MG capsule, Take 1 capsule (100 mg total) by mouth 2 (two) times daily., Disp: 10 capsule, Rfl: 0 .  FLUoxetine (PROZAC) 20 MG capsule, Take by mouth., Disp: , Rfl:  .  folic acid (FOLVITE) 1 MG tablet, Take 1 tablet (1 mg total) by mouth daily., Disp: 30 tablet, Rfl: 3 .  furosemide (LASIX) 20 MG tablet, Take 20 mg by mouth daily., Disp: , Rfl:  .  gabapentin (NEURONTIN) 300 MG capsule, Take by mouth., Disp: , Rfl:  .  omeprazole (PRILOSEC) 20 MG capsule, Take 20 mg by mouth daily as needed (Acid reflux)., Disp: , Rfl:  .  oxyCODONE (OXY IR/ROXICODONE) 5 MG immediate release tablet, Take 1 tablet (5 mg total) by mouth every 6 (six) hours as needed for severe pain., Disp: 15 tablet, Rfl: 0 .  polyethylene glycol (MIRALAX  / GLYCOLAX) 17 g packet, Take 17 g by mouth daily., Disp: 14 each, Rfl: 0 .  potassium chloride (KLOR-CON) 10 MEQ tablet, Take 1 tablet (10 mEq total) by mouth daily., Disp: 30 tablet, Rfl: 3 .  sacubitril-valsartan (ENTRESTO) 24-26 MG, Take 1 tablet by mouth 2 (two) times daily., Disp: 60 tablet, Rfl: 3 .  spironolactone (ALDACTONE) 25 MG tablet, Take 1 tablet (25 mg total) by mouth daily., Disp: 30 tablet, Rfl: 3 .  thiamine 100 MG tablet, Take 1 tablet (100 mg total) by mouth daily., Disp: 30 tablet, Rfl: 3 .  ticagrelor (BRILINTA) 90 MG TABS tablet, Take 1 tablet (90 mg total) by mouth 2 (two) times daily., Disp: 60 tablet, Rfl: 6 .  tiotropium (SPIRIVA) 18 MCG inhalation capsule, Place 18 mcg into inhaler and inhale daily., Disp: , Rfl:   Past Medical History: Past Medical History:  Diagnosis Date  . Arthritis   . COPD (chronic obstructive pulmonary disease) (HCC)   . Depression   . Diverticulitis   . Hyperlipidemia   . Hypertension   . Stroke [facility]) 2016   confirmed by CT scan     Tobacco Use: Social History   Tobacco Use  Smoking Status Current Some Day Smoker  . Packs/day: 0.25  . Years: 15.00  . Pack years: 3.75  . Types: Cigarettes  Smokeless Tobacco Former Neurosurgeon  Tobacco Comment   He smokes 2-4 Cigarettes a  day    Labs: Recent Review Flowsheet Data    Labs for ITP Cardiac and Pulmonary Rehab Latest Ref Rng & Units 01/30/2020 01/30/2020 01/31/2020 02/01/2020 02/01/2020   Cholestrol 0 - 200 mg/dL - - - - 295   LDLCALC 0 - 99 mg/dL - - - - 71   HDL >18 mg/dL - - - - 84(Z)   Trlycerides <150 mg/dL - - 660 630 160   Hemoglobin A1c 4.8 - 5.6 % - - 5.4 - -   PHART 7.350 - 7.450 7.445 7.424 - - -   PCO2ART 32.0 - 48.0 mmHg 38.7 43.1 - - -   HCO3 20.0 - 28.0 mmol/L 26.7 28.3(H) - - -   TCO2 22 - 32 mmol/L 28 30 - - -   ACIDBASEDEF 0.0 - 2.0 mmol/L - - - - -   O2SAT % 83.0 98.0 - - -       Exercise Target Goals: Exercise Program Goal: Individual exercise  prescription set using results from initial 6 min walk test and THRR while considering  patient's activity barriers and safety.   Exercise Prescription Goal: Initial exercise prescription builds to 30-45 minutes a day of aerobic activity, 2-3 days per week.  Home exercise guidelines will be given to patient during program as part of exercise prescription that the participant will acknowledge.   Education: Aerobic Exercise: - Group verbal and visual presentation on the components of exercise prescription. Introduces F.I.T.T principle from ACSM for exercise prescriptions.  Reviews F.I.T.T. principles of aerobic exercise including progression. Written material given at graduation.   Education: Resistance Exercise: - Group verbal and visual presentation on the components of exercise prescription. Introduces F.I.T.T principle from ACSM for exercise prescriptions  Reviews F.I.T.T. principles of resistance exercise including progression. Written material given at graduation.    Education: Exercise & Equipment Safety: - Individual verbal instruction and demonstration of equipment use and safety with use of the equipment. Flowsheet Row Cardiac Rehab from 05/03/2020 in [facility] Cardiac and Pulmonary Rehab  Date 03/01/20  Educator [facility]  Instruction Review Code 1- Verbalizes Understanding      Education: Exercise Physiology & General Exercise Guidelines: - Group verbal and written instruction with models to review the exercise physiology of the cardiovascular system and associated critical values. Provides general exercise guidelines with specific guidelines to those with heart or lung disease.    Education: Flexibility, Balance, Mind/Body Relaxation: - Group verbal and visual presentation with interactive activity on the components of exercise prescription. Introduces F.I.T.T principle from ACSM for exercise prescriptions. Reviews F.I.T.T. principles of flexibility and balance exercise training including  progression. Also discusses the mind body connection.  Reviews various relaxation techniques to help reduce and manage stress (i.e. Deep breathing, progressive muscle relaxation, and visualization). Balance handout provided to take home. Written material given at graduation.   Activity Barriers & Risk Stratification:  Activity Barriers & Cardiac Risk Stratification - 04/06/20 1641      Activity Barriers & Cardiac Risk Stratification   Activity Barriers Shortness of Breath;Deconditioning    Cardiac Risk Stratification High           6 Minute Walk:  6 Minute Walk    Row Name 04/06/20 1635         6 Minute Walk   Phase Initial     Distance 715 feet     Walk Time 4.5 minutes  Break 2:32-4:08     # of Rest Breaks 1     MPH 1.8  METS 2.24     RPE 11     Perceived Dyspnea  3     VO2 Peak 7.84     Symptoms Yes (comment)     Comments SOB     Resting HR 85 bpm     Resting BP 122/68     Resting Oxygen Saturation  97 %     Exercise Oxygen Saturation  during 6 min walk 96 %     Max Ex. HR 114 bpm     Max Ex. BP 170/74     2 Minute Post BP 130/76            Oxygen Initial Assessment:   Oxygen Re-Evaluation:   Oxygen Discharge (Final Oxygen Re-Evaluation):   Initial Exercise Prescription:  Initial Exercise Prescription - 04/06/20 1600      Date of Initial Exercise RX and Referring Provider   Date 04/06/20    Referring Provider Orpah Cobb MD      NuStep   Level 1    SPM 80    Minutes 15    METs 2.2      REL-XR   Level 1    Speed 50    Minutes 15    METs 2.2      Biostep-RELP   Level 1    SPM 50    Minutes 15    METs 2.2      Prescription Details   Frequency (times per week) 2    Duration Progress to 30 minutes of continuous aerobic without signs/symptoms of physical distress      Intensity   THRR 40-80% of Max Heartrate 116-148    Ratings of Perceived Exertion 11-13    Perceived Dyspnea 0-4      Progression   Progression Continue to  progress workloads to maintain intensity without signs/symptoms of physical distress.      Resistance Training   Training Prescription Yes    Weight 3 lb    Reps 10-15           Perform Capillary Blood Glucose checks as needed.  Exercise Prescription Changes:  Exercise Prescription Changes    Row Name 04/06/20 1600 05/08/20 0800           Response to Exercise   Blood Pressure (Admit) 122/68 134/84      Blood Pressure (Exercise) 170/74 136/80      Blood Pressure (Exit) 130/76 122/68      Heart Rate (Admit) 85 bpm 64 bpm      Heart Rate (Exercise) 114 bpm 93 bpm      Heart Rate (Exit) 91 bpm 77 bpm      Oxygen Saturation (Admit) 97 % --      Oxygen Saturation (Exercise) 96 % --      Oxygen Saturation (Exit) 97 % --      Rating of Perceived Exertion (Exercise) 11 14      Perceived Dyspnea (Exercise) 3 --      Symptoms SOB SOB      Comments walk test results second full day of exercise      Duration -- Progress to 30 minutes of  aerobic without signs/symptoms of physical distress      Intensity -- THRR unchanged             Progression   Progression -- Continue to progress workloads to maintain intensity without signs/symptoms of physical distress.      Average METs -- 1.6  Resistance Training   Training Prescription -- Yes      Weight -- 3 lb      Reps -- 10-15             Interval Training   Interval Training -- No             NuStep   Level -- 1      Minutes -- 15      METs -- 2             REL-XR   Level -- 1      Minutes -- 15      METs -- 1.2             Exercise Comments:  Exercise Comments    Row Name 05/01/20 1551           Exercise Comments First full day of exercise!  Patient was oriented to gym and equipment including functions, settings, policies, and procedures.  Patient's individual exercise prescription and treatment plan were reviewed.  All starting workloads were established based on the results of the 6 minute walk  test done at initial orientation visit.  The plan for exercise progression was also introduced and progression will be customized based on patient's performance and goals.              Exercise Goals and Review:  Exercise Goals    Row Name 04/06/20 1651             Exercise Goals   Increase Physical Activity Yes       Intervention Provide advice, education, support and counseling about physical activity/exercise needs.;Develop an individualized exercise prescription for aerobic and resistive training based on initial evaluation findings, risk stratification, comorbidities and participant's personal goals.       Expected Outcomes Short Term: Attend rehab on a regular basis to increase amount of physical activity.;Long Term: Add in home exercise to make exercise part of routine and to increase amount of physical activity.;Long Term: Exercising regularly at least 3-5 days a week.       Increase Strength and Stamina Yes       Intervention Provide advice, education, support and counseling about physical activity/exercise needs.;Develop an individualized exercise prescription for aerobic and resistive training based on initial evaluation findings, risk stratification, comorbidities and participant's personal goals.       Expected Outcomes Short Term: Increase workloads from initial exercise prescription for resistance, speed, and METs.;Short Term: Perform resistance training exercises routinely during rehab and add in resistance training at home;Long Term: Improve cardiorespiratory fitness, muscular endurance and strength as measured by increased METs and functional capacity ( )       Able to understand and use rate of perceived exertion (RPE) scale Yes       Intervention Provide education and explanation on how to use RPE scale       Expected Outcomes Short Term: Able to use RPE daily in rehab to express subjective intensity level;Long Term:  Able to use RPE to guide intensity level when  exercising independently       Able to understand and use Dyspnea scale Yes       Intervention Provide education and explanation on how to use Dyspnea scale       Expected Outcomes Short Term: Able to use Dyspnea scale daily in rehab to express subjective sense of shortness of breath during exertion;Long Term: Able to use Dyspnea scale to guide intensity level when exercising independently  Knowledge and understanding of Target Heart Rate Range (THRR) Yes       Intervention Provide education and explanation of THRR including how the numbers were predicted and where they are located for reference       Expected Outcomes Short Term: Able to state/look up THRR;Short Term: Able to use daily as guideline for intensity in rehab;Long Term: Able to use THRR to govern intensity when exercising independently       Able to check pulse independently Yes       Intervention Provide education and demonstration on how to check pulse in carotid and radial arteries.;Review the importance of being able to check your own pulse for safety during independent exercise       Expected Outcomes Short Term: Able to explain why pulse checking is important during independent exercise;Long Term: Able to check pulse independently and accurately       Understanding of Exercise Prescription Yes       Intervention Provide education, explanation, and written materials on patient's individual exercise prescription       Expected Outcomes Short Term: Able to explain program exercise prescription;Long Term: Able to explain home exercise prescription to exercise independently              Exercise Goals Re-Evaluation :  Exercise Goals Re-Evaluation    Row Name 05/01/20 1551 05/08/20 0807 06/06/20 1545         Exercise Goal Re-Evaluation   Exercise Goals Review Increase Physical Activity;Able to understand and use rate of perceived exertion (RPE) scale;Knowledge and understanding of Target Heart Rate Range  (THRR);Understanding of Exercise Prescription;Increase Strength and Stamina;Able to understand and use Dyspnea scale;Able to check pulse independently Increase Physical Activity;Increase Strength and Stamina;Understanding of Exercise Prescription Increase Physical Activity;Increase Strength and Stamina;Understanding of Exercise Prescription     Comments Reviewed RPE and dyspnea scales, THR and program prescription with pt today.  Pt voiced understanding and was given a copy of goals to take home. [name] has only completed his first two full days of exercise.  We will continue to monitor his progress. [name] has not attend rehab since 3/16, therefore there is no comment on his progress. We sent him a letter and will discharge him at the end of this week if we do not hear from him.     Expected Outcomes Short: Use RPE daily to regulate intensity. Long: Follow program prescription in THR. Short: Continue to attend rehab regularly Long: Continue to follow program prescription. --            Discharge Exercise Prescription (Final Exercise Prescription Changes):  Exercise Prescription Changes - 05/08/20 0800      Response to Exercise   Blood Pressure (Admit) 134/84    Blood Pressure (Exercise) 136/80    Blood Pressure (Exit) 122/68    Heart Rate (Admit) 64 bpm    Heart Rate (Exercise) 93 bpm    Heart Rate (Exit) 77 bpm    Rating of Perceived Exertion (Exercise) 14    Symptoms SOB    Comments second full day of exercise    Duration Progress to 30 minutes of  aerobic without signs/symptoms of physical distress    Intensity THRR unchanged      Progression   Progression Continue to progress workloads to maintain intensity without signs/symptoms of physical distress.    Average METs 1.6      Resistance Training   Training Prescription Yes    Weight 3 lb    Reps 10-15  Interval Training   Interval Training No      NuStep   Level 1    Minutes 15    METs 2      REL-XR   Level 1     Minutes 15    METs 1.2           Nutrition:  Target Goals: Understanding of nutrition guidelines, daily intake of sodium 1500mg , cholesterol 200mg , calories 30% from fat and 7% or less from saturated fats, daily to have 5 or more servings of fruits and vegetables.  Education: All About Nutrition: -Group instruction provided by verbal, written material, interactive activities, discussions, models, and posters to present general guidelines for heart healthy nutrition including fat, fiber, MyPlate, the role of sodium in heart healthy nutrition, utilization of the nutrition label, and utilization of this knowledge for meal planning. Follow up email sent as well. Written material given at graduation.   Biometrics:  Pre Biometrics - 05/01/20 1742      Pre Biometrics   Single Leg Stand 13.18 seconds            Nutrition Therapy Plan and Nutrition Goals:  Nutrition Therapy & Goals - 05/03/20 1644      Nutrition Therapy   Diet Heart healthy, low Na    Drug/Food Interactions Statins/Certain Fruits    Protein (specify units) 100g    Fiber 30 grams    Whole Grain Foods 3 servings    Saturated Fats 12 max. grams    Fruits and Vegetables 8 servings/day    Sodium 1.5 grams      Personal Nutrition Goals   Nutrition Goal ST: add 2 servings per day of fruits and vegetables he enjoys already LT: limit saturated fat <16g, have at least 5 servings of fruits/vegetables per day, have at least 1 serving of whole grains per day    Comments [name] and his girlfriend have previously tried to eat healthy cold [place], however, the changes made did not last long due to this. They tried things like switching to canola oil, switching to mrs dash and salt substitutes, diet soda. [name] did not enjoy the foods. He doesn't care for most fruits and vegetables besides corn, melons, and greens - encouraged to eat the vegetables he does enjoys. He eats captin crunch cereal - recommended a whole grain cereal and or  having fruit with it. Girlfriend reports wanting to try different vegetable preparations such as broccoli and brussels sprouts and that she thinks he would like it, he will not try it. [name] was not very vocal and active in the conversation, his girlfriend di most of the talking. Discussed heart healthy eating. Advised not to use salt substitues as they have significant amounts of potassium which is not recommended due to his heart failure.      Intervention Plan   Intervention Prescribe, educate and counsel regarding individualized specific dietary modifications aiming towards targeted core components such as weight, hypertension, lipid management, diabetes, heart failure and other comorbidities.;Nutrition handout(s) given to patient.    Expected Outcomes Short Term Goal: Understand basic principles of dietary content, such as calories, fat, sodium, cholesterol and nutrients.;Short Term Goal: A plan has been developed with personal nutrition goals set during dietitian appointment.;Long Term Goal: Adherence to prescribed nutrition plan.           Nutrition Assessments:  MEDIFICTS Score Key:  ?70 Need to make dietary changes   40-70 Heart Healthy Diet  ? 40 Therapeutic Level Cholesterol Diet  Flowsheet  Row Cardiac Rehab from 05/03/2020 in [facility] Cardiac and Pulmonary Rehab  Picture Your Plate Total Score on Admission 42     Picture Your Plate Scores:  <16 Unhealthy dietary pattern with much room for improvement.  41-50 Dietary pattern unlikely to meet recommendations for good health and room for improvement.  51-60 More healthful dietary pattern, with some room for improvement.   >60 Healthy dietary pattern, although there may be some specific behaviors that could be improved.    Nutrition Goals Re-Evaluation:   Nutrition Goals Discharge (Final Nutrition Goals Re-Evaluation):   Psychosocial: Target Goals: Acknowledge presence or absence of significant depression and/or stress,  maximize coping skills, provide positive support system. Participant is able to verbalize types and ability to use techniques and skills needed for reducing stress and depression.   Education: Stress, Anxiety, and Depression - Group verbal and visual presentation to define topics covered.  Reviews how body is impacted by stress, anxiety, and depression.  Also discusses healthy ways to reduce stress and to treat/manage anxiety and depression.  Written material given at graduation.   Education: Sleep Hygiene -Provides group verbal and written instruction about how sleep can affect your health.  Define sleep hygiene, discuss sleep cycles and impact of sleep habits. Review good sleep hygiene tips.    Initial Review & Psychosocial Screening:  Initial Psych Review & Screening - 03/01/20 1543      Initial Review   Current issues with Current Psychotropic Meds;Current Stress Concerns    Source of Stress Concerns Chronic Illness;Family;Financial    Comments He has a positive outlook on his health.      Family Dynamics   Good Support System? Yes    Comments He can look to his girlfriend and his mother for support.      Barriers   Psychosocial barriers to participate in program The patient should benefit from training in stress management and relaxation.      Screening Interventions   Interventions Encouraged to exercise;To provide support and resources with identified psychosocial needs;Provide feedback about the scores to participant    Expected Outcomes Short Term goal: Utilizing psychosocial counselor, staff and physician to assist with identification of specific Stressors or current issues interfering with healing process. Setting desired goal for each stressor or current issue identified.;Long Term Goal: Stressors or current issues are controlled or eliminated.;Short Term goal: Identification and review with participant of any Quality of Life or Depression concerns found by scoring the  questionnaire.;Long Term goal: The participant improves quality of Life and PHQ9 Scores as seen by post scores and/or verbalization of changes           Quality of Life Scores:   Quality of Life - 03/20/20 1604      Quality of Life   Select Quality of Life      Quality of Life Scores   Health/Function Pre 6 %    Socioeconomic Pre 25.71 %    Psych/Spiritual Pre 14.57 %    Family Pre 27.6 %    GLOBAL Pre 15 %          Scores of 19 and below usually indicate a poorer quality of life in these areas.  A difference of  2-3 points is a clinically meaningful difference.  A difference of 2-3 points in the total score of the Quality of Life Index has been associated with significant improvement in overall quality of life, self-image, physical symptoms, and general health in studies assessing change in quality of life.  PHQ-9: Recent Review Flowsheet Data    Dep[facility] 03/20/2020 03/10/2018 01/12/2018 10/27/2017 10/06/2017   Decreased Interest 3  0 0 0 0   Down, Depressed, Hopeless 3 0 0 0 1   PHQ - 2 Score 6 0 0 0 1   Altered sleeping 3 - - - -   Tired, decreased energy 3 - - - -   Change in appetite 3 - - - -   Feeling bad or failure about yourself  1 - - - -   Trouble concentrating 3 - - - -   Moving slowly or fidgety/restless 0 - - - -   Suicidal thoughts 0 - - - -   PHQ-9 Score 19 - - - -   Difficult doing work/chores Extremely dIfficult - - - -     Interpretation of Total Score  Total Score Depression Severity:  1-4 = Minimal depression, 5-9 = Mild depression, 10-14 = Moderate depression, 15-19 = Moderately severe depression, 20-27 = Severe depression   Psychosocial Evaluation and Intervention:  Psychosocial Evaluation - 03/01/20 1545      Psychosocial Evaluation & Interventions   Interventions Encouraged to exercise with the program and follow exercise prescription;Stress management education;Relaxation education    Comments He has a positive outlook on his  health.He can look to his girlfriend and his mother for support.    Expected Outcomes Short: Exercise regularly to support mental health and notify staff of any changes. Long: maintain mental health and well being through teaching of rehab or prescribed medications independently.    Continue Psychosocial Services  Follow up required by staff           Psychosocial Re-Evaluation:   Psychosocial Discharge (Final Psychosocial Re-Evaluation):   Vocational Rehabilitation: Provide vocational rehab assistance to qualifying candidates.   Vocational Rehab Evaluation & Intervention:   Education: Education Goals: Education classes will be provided on a variety of topics geared toward better understanding of heart health and risk factor modification. Participant will state understanding/return demonstration of topics presented as noted by education test scores.  Learning Barriers/Preferences:  Learning Barriers/Preferences - 03/01/20 1543      Learning Barriers/Preferences   Learning Barriers None    Learning Preferences None           General Cardiac Education Topics:  AED/CPR: - Group verbal and written instruction with the use of models to demonstrate the basic use of the AED with the basic ABC's of resuscitation.   Anatomy and Cardiac Procedures: - Group verbal and visual presentation and models provide information about basic cardiac anatomy and function. Reviews the testing methods done to diagnose heart disease and the outcomes of the test results. Describes the treatment choices: Medical Management, Angioplasty, or Coronary Bypass Surgery for treating various heart conditions including Myocardial Infarction, Angina, Valve Disease, and Cardiac Arrhythmias.  Written material given at graduation.   Medication Safety: - Group verbal and visual instruction to review commonly prescribed medications for heart and lung disease. Reviews the medication, class of the drug, and side  effects. Includes the steps to properly store meds and maintain the prescription regimen.  Written material given at graduation. Flowsheet Row Cardiac Rehab [facility]03/2020 in [facility] Cardiac [facility] Pul[facility] Rehab  Date 05/03/20  Educator [facility]  Instruction Review Code 1- Verbalizes Understanding      Intimacy: - Group verbal instruction through game format to discuss how heart and lung disease can affect sexual intimacy. Written material given at graduation.Marland Kitchen.   Know  Your Numbers and Heart Failure: - Group verbal and visual instruction to discuss disease risk factors for cardiac and pulmonary disease and treatment options.  Reviews associated critical values for Overweight/Obesity, Hypertension, Cholesterol, and Diabetes.  Discusses basics of heart failure: signs/symptoms and treatments.  Introduces Heart Failure Zone chart for action plan for heart failure.  Written material given at graduation.   Infection Prevention: - Provides verbal and written material to individual with discussion of infection control including proper hand washing and proper equipment cleaning during exercise session. Flowsheet Row Cardiac Rehab from 05/03/2020 in [facility] Cardiac and Pulmonary Rehab  Date 03/01/20  Educator [facility]  Instruction Review Code 1- Verbalizes Understanding      Falls Prevention: - Provides verbal and written material to individual with discussion of falls prevention and safety. Flowsheet Row Cardiac Rehab from 05/03/2020 in [facility] Cardiac and Pulmonary Rehab  Date 03/01/20  Educator [facility]  Instruction Review Code 1- Verbalizes Understanding      Other: -Provides group and verbal instruction on various topics (see comments)   Knowledge Questionnaire Score:  Knowledge Questionnaire Score - 03/20/20 1600      Knowledge Questionnaire Score   Pre Score 23/26: Angina, exercise, trans fat           Core Components/Risk Factors/Patient Goals at Admission:  Personal Goals and Risk Factors at  Admission - 04/06/20 1651      Core Components/Risk Factors/Patient Goals on Admission    Weight Management Yes;Weight Loss    Intervention Weight Management: Develop a combined nutrition and exercise program designed to reach desired caloric intake, while maintaining appropriate intake of nutrient and fiber, sodium and fats, and appropriate energy expenditure required for the weight goal.;Weight Management: Provide education and appropriate resources to help participant work on and attain dietary goals.    Admit Weight 310 lb (140.6 kg)    Goal Weight: Short Term 305 lb (138.3 kg)    Goal Weight: Long Term 290 lb (131.5 kg)    Expected Outcomes Short Term: Continue to assess and modify interventions until short term weight is achieved;Long Term: Adherence to nutrition and physical activity/exercise program aimed toward attainment of established weight goal;Weight Loss: Understanding of general recommendations for a balanced deficit meal plan, which promotes 1-2 lb weight loss per week and includes a negative energy balance of [phone] kcal/d;Understanding recommendations for meals to include 15-35% energy as protein, 25-35% energy from fat, 35-60% energy from carbohydrates, less than 200mg  of dietary cholesterol, 20-35 gm of total fiber daily;Understanding of distribution of calorie intake throughout the day with the consumption of 4-5 meals/snacks    Heart Failure Yes    Intervention Provide a combined exercise and nutrition program that is supplemented with education, support and counseling about heart failure. Directed toward relieving symptoms such as shortness of breath, decreased exercise tolerance, and extremity edema.    Expected Outcomes Improve functional capacity of life;Short term: Attendance in program 2-3 days a week with increased exercise capacity. Reported lower sodium intake. Reported increased fruit and vegetable intake. Reports medication compliance.;Short term: Daily weights obtained  and reported for increase. Utilizing diuretic protocols set by physician.;Long term: Adoption of self-care skills and reduction of barriers for early signs and symptoms recognition and intervention leading to self-care maintenance.    Hypertension Yes    Intervention Provide education on lifestyle modifcations including regular physical activity/exercise, weight management, moderate sodium restriction and increased consumption of fresh fruit, vegetables, and low fat dairy, alcohol moderation, and smoking cessation.;Monitor prescription use compliance.  Expected Outcomes Short Term: Continued assessment and intervention until BP is < 140/45mm HG in hypertensive participants. < 130/59mm HG in hypertensive participants with diabetes, heart failure or chronic kidney disease.;Long Term: Maintenance of blood pressure at goal levels.    Lipids Yes    Intervention Provide education and support for participant on nutrition & aerobic/resistive exercise along with prescribed medications to achieve LDL 70mg , HDL >40mg .    Expected Outcomes Short Term: Participant states understanding of desired cholesterol values and is compliant with medications prescribed. Participant is following exercise prescription and nutrition guidelines.;Long Term: Cholesterol controlled with medications as prescribed, with individualized exercise RX and with personalized nutrition plan. Value goals: LDL < 70mg , HDL > 40 mg.           Education:Diabetes - Individual verbal and written instruction to review signs/symptoms of diabetes, desired ranges of glucose level fasting, after meals and with exercise. Acknowledge that pre and post exercise glucose checks will be done for 3 sessions at entry of program.   Core Components/Risk Factors/Patient Goals Review:    Core Components/Risk Factors/Patient Goals at Discharge (Final Review):    ITP Comments:  ITP Comments    Row Name 03/01/20 1542 03/20/20 1537 04/06/20 1626 04/06/20  1654 04/12/20 0557   ITP Comments Virtual Visit completed. Patient informed on EP and RD appointment and 6 Minute walk test. Patient also informed of patient health questionnaires on My Chart. Patient Verbalizes understanding. Visit diagnosis can be found in [facility] 02/08/2020. Patient arrived to Rehab today, BP was 84/58. Patient asymptomatic besides feeling fatigued and SOB, which claims he has felt since his cardiac event in December. Patient's SO called doctor who advised to hold off on 2 medications tonight and will see doctor tomorrow morning. Patient is aware to call 911 if anything worsens or he feels worse. Completed January and gym orientation. Initial ITP created and sent for review to Dr. , Medical Director. Bethann Punches is a current tobacco user. Intervention for tobacco cessation was provided at the initial medical review. He was asked about readiness to quit and reported he is ready to quit but has not set a quit date yet. He has decreased from 1/2 pack to about 4-5/ day. Patient was advised and educated about tobacco cessation using combination therapy, tobacco cessation classes, quit line, and quit smoking apps. Patient demonstrated understanding of this material. Staff will continue to provide encouragement and follow up with the patient throughout the program. 30 Day review completed. Medical Director ITP review done, changes made as directed, and signed approval by Medical Director.   Row Name 04/24/20 1836 05/01/20 1551 05/10/20 0700 06/06/20 0802 06/07/20 0816   ITP Comments Followed up with patient- states he had a lot of appointments this week and would like to push his start date for Heart Track for next Monday, 3/14. Patient agreed and confirmed time. First full day of exercise!  Patient was oriented to gym and equipment including functions, settings, policies, and procedures.  Patient's individual exercise prescription and treatment plan were reviewed.  All starting workloads were  established based on the results of the 6 minute walk test done at initial orientation visit.  The plan for exercise progression was also introduced and progression will be customized based on patient's performance and goals. 30 Day review completed. Medical Director ITP review done, changes made as directed, and signed approval by Medical Director. Sent letter on 4/13. Patient has not attended since 3/16. 30 Day review completed. Medical Director ITP review done,  changes made as directed, and signed approval by Medical Director.          Comments:

## 2020-06-15 DIAGNOSIS — Z955 Presence of coronary angioplasty implant and graft: Secondary | ICD-10-CM

## 2020-06-15 DIAGNOSIS — I214 Non-ST elevation (NSTEMI) myocardial infarction: Secondary | ICD-10-CM

## 2020-06-15 NOTE — Progress Notes (Signed)
Discharge Progress Report  Patient Details  Name: Tommy Gaines MRN: [id number] Date of Birth: [birth year] Referring Provider:   Flowsheet Row Cardiac Rehab from 04/06/2020 in [facility] Cardiac and Pulmonary Rehab  Referring Provider Orpah Cobb MD       Number of Visits: 4  Reason for Discharge:  Early Exit:  Lack of attendance  Smoking History:  Social History   Tobacco Use  Smoking Status Current Some Day Smoker  . Packs/day: 0.25  . Years: 15.00  . Pack years: 3.75  . Types: Cigarettes  Smokeless Tobacco Former Neurosurgeon  Tobacco Comment   He smokes 2-4 Cigarettes a day    Diagnosis:  NSTEMI (non-ST elevated myocardial infarction) (HCC)  Status post coronary artery stent placement  ADL [facility]:   Initial Exercise Prescription:  Initial Exercise Prescription - 04/06/20 1600      Date of Initial Exercise RX and Referring Provider   Date 04/06/20    Referring Provider Orpah Cobb MD      NuStep   Level 1    SPM 80    Minutes 15    METs 2.2      REL-XR   Level 1    Speed 50    Minutes 15    METs 2.2      Biostep-RELP   Level 1    SPM 50    Minutes 15    METs 2.2      Prescription Details   Frequency (times per week) 2    Duration Progress to 30 minutes of continuous aerobic without signs/symptoms of physical distress      Intensity   THRR 40-80% of Max Heartrate 116-148    Ratings of Perceived Exertion 11-13    Perceived Dyspnea 0-4      Progression   Progression Continue to progress workloads to maintain intensity without signs/symptoms of physical distress.      Resistance Training   Training Prescription Yes    Weight 3 lb    Reps 10-15           Discharge Exercise Prescription (Final Exercise Prescription Changes):  Exercise Prescription Changes - 05/08/20 0800      Response to Exercise   Blood Pressure (Admit) 134/84    Blood Pressure (Exercise) 136/80    Blood Pressure (Exit) 122/68    Heart Rate (Admit) 64 bpm    Heart Rate  (Exercise) 93 bpm    Heart Rate (Exit) 77 bpm    Rating of Perceived Exertion (Exercise) 14    Symptoms SOB    Comments second full day of exercise    Duration Progress to 30 minutes of  aerobic without signs/symptoms of physical distress    Intensity THRR unchanged      Progression   Progression Continue to progress workloads to maintain intensity without signs/symptoms of physical distress.    Average METs 1.6      Resistance Training   Training Prescription Yes    Weight 3 lb    Reps 10-15      Interval Training   Interval Training No      NuStep   Level 1    Minutes 15    METs 2      REL-XR   Level 1    Minutes 15    METs 1.2           Functional Capacity:  6 Minute Walk    Row Name 04/06/20 1635         6  Minute Walk   Phase Initial     Distance 715 feet     Walk Time 4.5 minutes  Break 2:32-4:08     # of Rest Breaks 1     MPH 1.8     METS 2.24     RPE 11     Perceived Dyspnea  3     VO2 Peak 7.84     Symptoms Yes (comment)     Comments SOB     Resting HR 85 bpm     Resting BP 122/68     Resting Oxygen Saturation  97 %     Exercise Oxygen Saturation  during 6 min walk 96 %     Max Ex. HR 114 bpm     Max Ex. BP 170/74     2 Minute Post BP 130/76            Psychological, QOL, Others - Outcomes: PHQ 2/9: Depression screen [facility] 2/9 03/20/2020 03/10/2018 01/12/2018 10/27/2017 10/06/2017  Decreased Interest 3 0 0 0 0  Down, Depressed, Hopeless 3 0 0 0 1  PHQ - 2 Score 6 0 0 0 1  Altered sleeping 3 - - - -  Tired, decreased energy 3 - - - -  Change in appetite 3 - - - -  Feeling bad or failure about yourself  1 - - - -  Trouble concentrating 3 - - - -  Moving slowly or fidgety/restless 0 - - - -  Suicidal thoughts 0 - - - -  PHQ-9 Score 19 - - - -  Difficult doing work/chores Extremely dIfficult - - - -      Nutrition & Weight - Outcomes:  Pre Biometrics - 05/01/20 1742      Pre Biometrics   Single Leg Stand 13.18 seconds             Nutrition:  Nutrition Therapy & Goals - 05/03/20 1644      Nutrition Therapy   Diet Heart healthy, low Na    Drug/Food Interactions Statins/Certain Fruits    Protein (specify units) 100g    Fiber 30 grams    Whole Grain Foods 3 servings    Saturated Fats 12 max. grams    Fruits and Vegetables 8 servings/day    Sodium 1.5 grams      Personal Nutrition Goals   Nutrition Goal ST: add 2 servings per day of fruits and vegetables he enjoys already LT: limit saturated fat <16g, have at least 5 servings of fruits/vegetables per day, have at least 1 serving of whole grains per day    Comments Tommy Gaines and his girlfriend have previously tried to eat healthy cold [place], however, the changes made did not last long due to this. They tried things like switching to canola oil, switching to mrs dash and salt substitutes, diet soda. Tommy Gaines did not enjoy the foods. He doesn't care for most fruits and vegetables besides corn, melons, and greens - encouraged to eat the vegetables he does enjoys. He eats captin crunch cereal - recommended a whole grain cereal and or having fruit with it. Girlfriend reports wanting to try different vegetable preparations such as broccoli and brussels sprouts and that she thinks he would like it, he will not try it. Lorin Gaines was not very vocal and active in the conversation, his girlfriend di most of the talking. Discussed heart healthy eating. Advised not to use salt substitues as they have significant amounts of potassium which is not recommended due  to his heart failure.      Intervention Plan   Intervention Prescribe, educate and counsel regarding individualized specific dietary modifications aiming towards targeted core components such as weight, hypertension, lipid management, diabetes, heart failure and other comorbidities.;Nutrition handout(s) given to patient.    Expected Outcomes Short Term Goal: Understand basic principles of dietary content, such as calories, fat, sodium,  cholesterol and nutrients.;Short Term Goal: A plan has been developed with personal nutrition goals set during dietitian appointment.;Long Term Goal: Adherence to prescribed nutrition plan.           Nutrition Discharge:    Goals reviewed with patient; copy given to patient.

## 2020-06-15 NOTE — Progress Notes (Signed)
Cardiac Individual Treatment Plan  Patient Details  Name: Tommy Gaines MRN: [id number] Date of Birth: 05/26/1965 Referring Provider:   Flowsheet Row Cardiac Rehab from 04/06/2020 in [facility] Cardiac and Pulmonary Rehab  Referring Provider Orpah Cobb MD      Initial Encounter Date:  Flowsheet Row Cardiac Rehab from 04/06/2020 in [facility] Cardiac and Pulmonary Rehab  Date 04/06/20      Visit Diagnosis: No diagnosis found.  Patient's Home Medications on Admission:  Current Outpatient Medications:  .  albuterol (PROVENTIL HFA;VENTOLIN HFA) 108 (90 Base) MCG/ACT inhaler, Inhale 2 puffs into the lungs 3 (three) times daily., Disp: , Rfl:  .  aspirin EC 81 MG tablet, Take 81 mg by mouth., Disp: , Rfl:  .  atorvastatin (LIPITOR) 80 MG tablet, Take 1 tablet (80 mg total) by mouth daily., Disp: 30 tablet, Rfl: 3 .  budesonide-formoterol (SYMBICORT) 160-4.5 MCG/ACT inhaler, Inhale 1 puff into the lungs 2 (two) times daily., Disp: , Rfl:  .  carvedilol (COREG) 3.125 MG tablet, Take 1 tablet (3.125 mg total) by mouth 2 (two) times daily with a meal., Disp: 60 tablet, Rfl: 3 .  docusate sodium (COLACE) 100 MG capsule, Take 1 capsule (100 mg total) by mouth 2 (two) times daily., Disp: 10 capsule, Rfl: 0 .  FLUoxetine (PROZAC) 20 MG capsule, Take by mouth., Disp: , Rfl:  .  folic acid (FOLVITE) 1 MG tablet, Take 1 tablet (1 mg total) by mouth daily., Disp: 30 tablet, Rfl: 3 .  furosemide (LASIX) 20 MG tablet, Take 20 mg by mouth daily., Disp: , Rfl:  .  gabapentin (NEURONTIN) 300 MG capsule, Take by mouth., Disp: , Rfl:  .  omeprazole (PRILOSEC) 20 MG capsule, Take 20 mg by mouth daily as needed (Acid reflux)., Disp: , Rfl:  .  oxyCODONE (OXY IR/ROXICODONE) 5 MG immediate release tablet, Take 1 tablet (5 mg total) by mouth every 6 (six) hours as needed for severe pain., Disp: 15 tablet, Rfl: 0 .  polyethylene glycol (MIRALAX / GLYCOLAX) 17 g packet, Take 17 g by mouth daily., Disp: 14 each, Rfl: 0 .   potassium chloride (KLOR-CON) 10 MEQ tablet, Take 1 tablet (10 mEq total) by mouth daily., Disp: 30 tablet, Rfl: 3 .  sacubitril-valsartan (ENTRESTO) 24-26 MG, Take 1 tablet by mouth 2 (two) times daily., Disp: 60 tablet, Rfl: 3 .  spironolactone (ALDACTONE) 25 MG tablet, Take 1 tablet (25 mg total) by mouth daily., Disp: 30 tablet, Rfl: 3 .  thiamine 100 MG tablet, Take 1 tablet (100 mg total) by mouth daily., Disp: 30 tablet, Rfl: 3 .  ticagrelor (BRILINTA) 90 MG TABS tablet, Take 1 tablet (90 mg total) by mouth 2 (two) times daily., Disp: 60 tablet, Rfl: 6 .  tiotropium (SPIRIVA) 18 MCG inhalation capsule, Place 18 mcg into inhaler and inhale daily., Disp: , Rfl:   Past Medical History: Past Medical History:  Diagnosis Date  . Arthritis   . COPD (chronic obstructive pulmonary disease) (HCC)   . Depression   . Diverticulitis   . Hyperlipidemia   . Hypertension   . Stroke [facility]) 2016   confirmed by CT scan     Tobacco Use: Social History   Tobacco Use  Smoking Status Current Some Day Smoker  . Packs/day: 0.25  . Years: 15.00  . Pack years: 3.75  . Types: Cigarettes  Smokeless Tobacco Former Neurosurgeon  Tobacco Comment   He smokes 2-4 Cigarettes a day    Labs: Recent Review Advice worker  Labs for ITP Cardiac and Pulmonary Rehab Latest Ref Rng & Units 01/30/2020 01/30/2020 01/31/2020 02/01/2020 02/01/2020   Cholestrol 0 - 200 mg/dL - - - - 161   LDLCALC 0 - 99 mg/dL - - - - 71   HDL >09 mg/dL - - - - 60(A)   Trlycerides <150 mg/dL - - 540 981 191   Hemoglobin A1c 4.8 - 5.6 % - - 5.4 - -   PHART 7.350 - 7.450 7.445 7.424 - - -   PCO2ART 32.0 - 48.0 mmHg 38.7 43.1 - - -   HCO3 20.0 - 28.0 mmol/L 26.7 28.3(H) - - -   TCO2 22 - 32 mmol/L 28 30 - - -   ACIDBASEDEF 0.0 - 2.0 mmol/L - - - - -   O2SAT % 83.0 98.0 - - -       Exercise Target Goals: Exercise Program Goal: Individual exercise prescription set using results from initial 6 min walk test and THRR while considering   patient's activity barriers and safety.   Exercise Prescription Goal: Initial exercise prescription builds to 30-45 minutes a day of aerobic activity, 2-3 days per week.  Home exercise guidelines will be given to patient during program as part of exercise prescription that the participant will acknowledge.   Education: Aerobic Exercise: - Group verbal and visual presentation on the components of exercise prescription. Introduces F.I.T.T principle from ACSM for exercise prescriptions.  Reviews F.I.T.T. principles of aerobic exercise including progression. Written material given at graduation.   Education: Resistance Exercise: - Group verbal and visual presentation on the components of exercise prescription. Introduces F.I.T.T principle from ACSM for exercise prescriptions  Reviews F.I.T.T. principles of resistance exercise including progression. Written material given at graduation.    Education: Exercise & Equipment Safety: - Individual verbal instruction and demonstration of equipment use and safety with use of the equipment. Flowsheet Row Cardiac Rehab from 05/03/2020 in [facility] Cardiac and Pulmonary Rehab  Date 03/01/20  Educator [facility]  Instruction Review Code 1- Verbalizes Understanding      Education: Exercise Physiology & General Exercise Guidelines: - Group verbal and written instruction with models to review the exercise physiology of the cardiovascular system and associated critical values. Provides general exercise guidelines with specific guidelines to those with heart or lung disease.    Education: Flexibility, Balance, Mind/Body Relaxation: - Group verbal and visual presentation with interactive activity on the components of exercise prescription. Introduces F.I.T.T principle from ACSM for exercise prescriptions. Reviews F.I.T.T. principles of flexibility and balance exercise training including progression. Also discusses the mind body connection.  Reviews various relaxation  techniques to help reduce and manage stress (i.e. Deep breathing, progressive muscle relaxation, and visualization). Balance handout provided to take home. Written material given at graduation.   Activity Barriers & Risk Stratification:  Activity Barriers & Cardiac Risk Stratification - 04/06/20 1641      Activity Barriers & Cardiac Risk Stratification   Activity Barriers Shortness of Breath;Deconditioning    Cardiac Risk Stratification High           6 Minute Walk:  6 Minute Walk    Row Name 04/06/20 1635         6 Minute Walk   Phase Initial     Distance 715 feet     Walk Time 4.5 minutes  Break 2:32-4:08     # of Rest Breaks 1     MPH 1.8     METS 2.24     RPE 11  Perceived Dyspnea  3     VO2 Peak 7.84     Symptoms Yes (comment)     Comments SOB     Resting HR 85 bpm     Resting BP 122/68     Resting Oxygen Saturation  97 %     Exercise Oxygen Saturation  during 6 min walk 96 %     Max Ex. HR 114 bpm     Max Ex. BP 170/74     2 Minute Post BP 130/76            Oxygen Initial Assessment:   Oxygen Re-Evaluation:   Oxygen Discharge (Final Oxygen Re-Evaluation):   Initial Exercise Prescription:  Initial Exercise Prescription - 04/06/20 1600      Date of Initial Exercise RX and Referring Provider   Date 04/06/20    Referring Provider Orpah Cobb MD      NuStep   Level 1    SPM 80    Minutes 15    METs 2.2      REL-XR   Level 1    Speed 50    Minutes 15    METs 2.2      Biostep-RELP   Level 1    SPM 50    Minutes 15    METs 2.2      Prescription Details   Frequency (times per week) 2    Duration Progress to 30 minutes of continuous aerobic without signs/symptoms of physical distress      Intensity   THRR 40-80% of Max Heartrate 116-148    Ratings of Perceived Exertion 11-13    Perceived Dyspnea 0-4      Progression   Progression Continue to progress workloads to maintain intensity without signs/symptoms of physical distress.       Resistance Training   Training Prescription Yes    Weight 3 lb    Reps 10-15           Perform Capillary Blood Glucose checks as needed.  Exercise Prescription Changes:   Exercise Prescription Changes    Row Name 04/06/20 1600 05/08/20 0800           Response to Exercise   Blood Pressure (Admit) 122/68 134/84      Blood Pressure (Exercise) 170/74 136/80      Blood Pressure (Exit) 130/76 122/68      Heart Rate (Admit) 85 bpm 64 bpm      Heart Rate (Exercise) 114 bpm 93 bpm      Heart Rate (Exit) 91 bpm 77 bpm      Oxygen Saturation (Admit) 97 % --      Oxygen Saturation (Exercise) 96 % --      Oxygen Saturation (Exit) 97 % --      Rating of Perceived Exertion (Exercise) 11 14      Perceived Dyspnea (Exercise) 3 --      Symptoms SOB SOB      Comments walk test results second full day of exercise      Duration -- Progress to 30 minutes of  aerobic without signs/symptoms of physical distress      Intensity -- THRR unchanged             Progression   Progression -- Continue to progress workloads to maintain intensity without signs/symptoms of physical distress.      Average METs -- 1.6             Resistance Training   Training  Prescription -- Yes      Weight -- 3 lb      Reps -- 10-15             Interval Training   Interval Training -- No             NuStep   Level -- 1      Minutes -- 15      METs -- 2             REL-XR   Level -- 1      Minutes -- 15      METs -- 1.2             Exercise Comments:   Exercise Comments    Row Name 05/01/20 1551           Exercise Comments First full day of exercise!  Patient was oriented to gym and equipment including functions, settings, policies, and procedures.  Patient's individual exercise prescription and treatment plan were reviewed.  All starting workloads were established based on the results of the 6 minute walk test done at initial orientation visit.  The plan for exercise progression was also  introduced and progression will be customized based on patient's performance and goals.              Exercise Goals and Review:   Exercise Goals    Row Name 04/06/20 1651             Exercise Goals   Increase Physical Activity Yes       Intervention Provide advice, education, support and counseling about physical activity/exercise needs.;Develop an individualized exercise prescription for aerobic and resistive training based on initial evaluation findings, risk stratification, comorbidities and participant's personal goals.       Expected Outcomes Short Term: Attend rehab on a regular basis to increase amount of physical activity.;Long Term: Add in home exercise to make exercise part of routine and to increase amount of physical activity.;Long Term: Exercising regularly at least 3-5 days a week.       Increase Strength and Stamina Yes       Intervention Provide advice, education, support and counseling about physical activity/exercise needs.;Develop an individualized exercise prescription for aerobic and resistive training based on initial evaluation findings, risk stratification, comorbidities and participant's personal goals.       Expected Outcomes Short Term: Increase workloads from initial exercise prescription for resistance, speed, and METs.;Short Term: Perform resistance training exercises routinely during rehab and add in resistance training at home;Long Term: Improve cardiorespiratory fitness, muscular endurance and strength as measured by increased METs and functional capacity ( )       Able to understand and use rate of perceived exertion (RPE) scale Yes       Intervention Provide education and explanation on how to use RPE scale       Expected Outcomes Short Term: Able to use RPE daily in rehab to express subjective intensity level;Long Term:  Able to use RPE to guide intensity level when exercising independently       Able to understand and use Dyspnea scale Yes        Intervention Provide education and explanation on how to use Dyspnea scale       Expected Outcomes Short Term: Able to use Dyspnea scale daily in rehab to express subjective sense of shortness of breath during exertion;Long Term: Able to use Dyspnea scale to guide intensity level when exercising independently  Knowledge and understanding of Target Heart Rate Range (THRR) Yes       Intervention Provide education and explanation of THRR including how the numbers were predicted and where they are located for reference       Expected Outcomes Short Term: Able to state/look up THRR;Short Term: Able to use daily as guideline for intensity in rehab;Long Term: Able to use THRR to govern intensity when exercising independently       Able to check pulse independently Yes       Intervention Provide education and demonstration on how to check pulse in carotid and radial arteries.;Review the importance of being able to check your own pulse for safety during independent exercise       Expected Outcomes Short Term: Able to explain why pulse checking is important during independent exercise;Long Term: Able to check pulse independently and accurately       Understanding of Exercise Prescription Yes       Intervention Provide education, explanation, and written materials on patient's individual exercise prescription       Expected Outcomes Short Term: Able to explain program exercise prescription;Long Term: Able to explain home exercise prescription to exercise independently              Exercise Goals Re-Evaluation :  Exercise Goals Re-Evaluation    Row Name 05/01/20 1551 05/08/20 0807 06/06/20 1545         Exercise Goal Re-Evaluation   Exercise Goals Review Increase Physical Activity;Able to understand and use rate of perceived exertion (RPE) scale;Knowledge and understanding of Target Heart Rate Range (THRR);Understanding of Exercise Prescription;Increase Strength and Stamina;Able to understand and use  Dyspnea scale;Able to check pulse independently Increase Physical Activity;Increase Strength and Stamina;Understanding of Exercise Prescription Increase Physical Activity;Increase Strength and Stamina;Understanding of Exercise Prescription     Comments Reviewed RPE and dyspnea scales, THR and program prescription with pt today.  Pt voiced understanding and was given a copy of goals to take home. [name] has only completed his first two full days of exercise.  We will continue to monitor his progress. [name] has not attend rehab since 3/16, therefore there is no comment on his progress. We sent him a letter and will discharge him at the end of this week if we do not hear from him.     Expected Outcomes Short: Use RPE daily to regulate intensity. Long: Follow program prescription in THR. Short: Continue to attend rehab regularly Long: Continue to follow program prescription. --            Discharge Exercise Prescription (Final Exercise Prescription Changes):  Exercise Prescription Changes - 05/08/20 0800      Response to Exercise   Blood Pressure (Admit) 134/84    Blood Pressure (Exercise) 136/80    Blood Pressure (Exit) 122/68    Heart Rate (Admit) 64 bpm    Heart Rate (Exercise) 93 bpm    Heart Rate (Exit) 77 bpm    Rating of Perceived Exertion (Exercise) 14    Symptoms SOB    Comments second full day of exercise    Duration Progress to 30 minutes of  aerobic without signs/symptoms of physical distress    Intensity THRR unchanged      Progression   Progression Continue to progress workloads to maintain intensity without signs/symptoms of physical distress.    Average METs 1.6      Resistance Training   Training Prescription Yes    Weight 3 lb    Reps 10-15  Interval Training   Interval Training No      NuStep   Level 1    Minutes 15    METs 2      REL-XR   Level 1    Minutes 15    METs 1.2           Nutrition:  Target Goals: Understanding of nutrition guidelines,  daily intake of sodium 1500mg , cholesterol 200mg , calories 30% from fat and 7% or less from saturated fats, daily to have 5 or more servings of fruits and vegetables.  Education: All About Nutrition: -Group instruction provided by verbal, written material, interactive activities, discussions, models, and posters to present general guidelines for heart healthy nutrition including fat, fiber, MyPlate, the role of sodium in heart healthy nutrition, utilization of the nutrition label, and utilization of this knowledge for meal planning. Follow up email sent as well. Written material given at graduation.   Biometrics:  Pre Biometrics - 05/01/20 1742      Pre Biometrics   Single Leg Stand 13.18 seconds            Nutrition Therapy Plan and Nutrition Goals:  Nutrition Therapy & Goals - 05/03/20 1644      Nutrition Therapy   Diet Heart healthy, low Na    Drug/Food Interactions Statins/Certain Fruits    Protein (specify units) 100g    Fiber 30 grams    Whole Grain Foods 3 servings    Saturated Fats 12 max. grams    Fruits and Vegetables 8 servings/day    Sodium 1.5 grams      Personal Nutrition Goals   Nutrition Goal ST: add 2 servings per day of fruits and vegetables he enjoys already LT: limit saturated fat <16g, have at least 5 servings of fruits/vegetables per day, have at least 1 serving of whole grains per day    Comments [name] and his girlfriend have previously tried to eat healthy cold [place], however, the changes made did not last long due to this. They tried things like switching to canola oil, switching to mrs dash and salt substitutes, diet soda. [name] did not enjoy the foods. He doesn't care for most fruits and vegetables besides corn, melons, and greens - encouraged to eat the vegetables he does enjoys. He eats captin crunch cereal - recommended a whole grain cereal and or having fruit with it. Girlfriend reports wanting to try different vegetable preparations such as broccoli  and brussels sprouts and that she thinks he would like it, he will not try it. [name] was not very vocal and active in the conversation, his girlfriend di most of the talking. Discussed heart healthy eating. Advised not to use salt substitues as they have significant amounts of potassium which is not recommended due to his heart failure.      Intervention Plan   Intervention Prescribe, educate and counsel regarding individualized specific dietary modifications aiming towards targeted core components such as weight, hypertension, lipid management, diabetes, heart failure and other comorbidities.;Nutrition handout(s) given to patient.    Expected Outcomes Short Term Goal: Understand basic principles of dietary content, such as calories, fat, sodium, cholesterol and nutrients.;Short Term Goal: A plan has been developed with personal nutrition goals set during dietitian appointment.;Long Term Goal: Adherence to prescribed nutrition plan.           Nutrition Assessments:  MEDIFICTS Score Key:  ?70 Need to make dietary changes   40-70 Heart Healthy Diet  ? 40 Therapeutic Level Cholesterol Diet  Flowsheet  Row Cardiac Rehab from 05/03/2020 in [facility] Cardiac and Pulmonary Rehab  Picture Your Plate Total Score on Admission 42     Picture Your Plate Scores:  <40 Unhealthy dietary pattern with much room for improvement.  41-50 Dietary pattern unlikely to meet recommendations for good health and room for improvement.  51-60 More healthful dietary pattern, with some room for improvement.   >60 Healthy dietary pattern, although there may be some specific behaviors that could be improved.    Nutrition Goals Re-Evaluation:   Nutrition Goals Discharge (Final Nutrition Goals Re-Evaluation):   Psychosocial: Target Goals: Acknowledge presence or absence of significant depression and/or stress, maximize coping skills, provide positive support system. Participant is able to verbalize types and ability  to use techniques and skills needed for reducing stress and depression.   Education: Stress, Anxiety, and Depression - Group verbal and visual presentation to define topics covered.  Reviews how body is impacted by stress, anxiety, and depression.  Also discusses healthy ways to reduce stress and to treat/manage anxiety and depression.  Written material given at graduation.   Education: Sleep Hygiene -Provides group verbal and written instruction about how sleep can affect your health.  Define sleep hygiene, discuss sleep cycles and impact of sleep habits. Review good sleep hygiene tips.    Initial Review & Psychosocial Screening:  Initial Psych Review & Screening - 03/01/20 1543      Initial Review   Current issues with Current Psychotropic Meds;Current Stress Concerns    Source of Stress Concerns Chronic Illness;Family;Financial    Comments He has a positive outlook on his health.      Family Dynamics   Good Support System? Yes    Comments He can look to his girlfriend and his mother for support.      Barriers   Psychosocial barriers to participate in program The patient should benefit from training in stress management and relaxation.      Screening Interventions   Interventions Encouraged to exercise;To provide support and resources with identified psychosocial needs;Provide feedback about the scores to participant    Expected Outcomes Short Term goal: Utilizing psychosocial counselor, staff and physician to assist with identification of specific Stressors or current issues interfering with healing process. Setting desired goal for each stressor or current issue identified.;Long Term Goal: Stressors or current issues are controlled or eliminated.;Short Term goal: Identification and review with participant of any Quality of Life or Depression concerns found by scoring the questionnaire.;Long Term goal: The participant improves quality of Life and PHQ9 Scores as seen by post scores  and/or verbalization of changes           Quality of Life Scores:   Quality of Life - 03/20/20 1604      Quality of Life   Select Quality of Life      Quality of Life Scores   Health/Function Pre 6 %    Socioeconomic Pre 25.71 %    Psych/Spiritual Pre 14.57 %    Family Pre 27.6 %    GLOBAL Pre 15 %          Scores of 19 and below usually indicate a poorer quality of life in these areas.  A difference of  2-3 points is a clinically meaningful difference.  A difference of 2-3 points in the total score of the Quality of Life Index has been associated with significant improvement in overall quality of life, self-image, physical symptoms, and general health in studies assessing change in quality of life.  PHQ-9: Recent Review Flowsheet Data    Depression screen [facility] 2/9 03/20/2020 03/10/2018 01/12/2018 10/27/2017 10/06/2017   Decreased Interest 3  0 0 0 0   Down, Depressed, Hopeless 3 0 0 0 1   PHQ - 2 Score 6 0 0 0 1   Altered sleeping 3 - - - -   Tired, decreased energy 3 - - - -   Change in appetite 3 - - - -   Feeling bad or failure about yourself  1 - - - -   Trouble concentrating 3 - - - -   Moving slowly or fidgety/restless 0 - - - -   Suicidal thoughts 0 - - - -   PHQ-9 Score 19 - - - -   Difficult doing work/chores Extremely dIfficult - - - -     Interpretation of Total Score  Total Score Depression Severity:  1-4 = Minimal depression, 5-9 = Mild depression, 10-14 = Moderate depression, 15-19 = Moderately severe depression, 20-27 = Severe depression   Psychosocial Evaluation and Intervention:  Psychosocial Evaluation - 03/01/20 1545      Psychosocial Evaluation & Interventions   Interventions Encouraged to exercise with the program and follow exercise prescription;Stress management education;Relaxation education    Comments He has a positive outlook on his health.He can look to his girlfriend and his mother for support.    Expected Outcomes Short: Exercise regularly  to support mental health and notify staff of any changes. Long: maintain mental health and well being through teaching of rehab or prescribed medications independently.    Continue Psychosocial Services  Follow up required by staff           Psychosocial Re-Evaluation:   Psychosocial Discharge (Final Psychosocial Re-Evaluation):   Vocational Rehabilitation: Provide vocational rehab assistance to qualifying candidates.   Vocational Rehab Evaluation & Intervention:   Education: Education Goals: Education classes will be provided on a variety of topics geared toward better understanding of heart health and risk factor modification. Participant will state understanding/return demonstration of topics presented as noted by education test scores.  Learning Barriers/Preferences:  Learning Barriers/Preferences - 03/01/20 1543      Learning Barriers/Preferences   Learning Barriers None    Learning Preferences None           General Cardiac Education Topics:  AED/CPR: - Group verbal and written instruction with the use of models to demonstrate the basic use of the AED with the basic ABC's of resuscitation.   Anatomy and Cardiac Procedures: - Group verbal and visual presentation and models provide information about basic cardiac anatomy and function. Reviews the testing methods done to diagnose heart disease and the outcomes of the test results. Describes the treatment choices: Medical Management, Angioplasty, or Coronary Bypass Surgery for treating various heart conditions including Myocardial Infarction, Angina, Valve Disease, and Cardiac Arrhythmias.  Written material given at graduation.   Medication Safety: - Group verbal and visual instruction to review commonly prescribed medications for heart and lung disease. Reviews the medication, class of the drug, and side effects. Includes the steps to properly store meds and maintain the prescription regimen.  Written material given at  graduation. Flowsheet Row Cardiac Rehab from 05/03/2020 in [facility] Cardiac and Pulmonary Rehab  Date 05/03/20  Educator [facility]  Instruction Review Code 1- Verbalizes Understanding      Intimacy: - Group verbal instruction through game format to discuss how heart and lung disease can affect sexual intimacy. Written material given at graduation.Marland Kitchen   Know  Your Numbers and Heart Failure: - Group verbal and visual instruction to discuss disease risk factors for cardiac and pulmonary disease and treatment options.  Reviews associated critical values for Overweight/Obesity, Hypertension, Cholesterol, and Diabetes.  Discusses basics of heart failure: signs/symptoms and treatments.  Introduces Heart Failure Zone chart for action plan for heart failure.  Written material given at graduation.   Infection Prevention: - Provides verbal and written material to individual with discussion of infection control including proper hand washing and proper equipment cleaning during exercise session. Flowsheet Row Cardiac Rehab from 05/03/2020 in [facility] Cardiac and Pulmonary Rehab  Date 03/01/20  Educator [facility]  Instruction Review Code 1- Verbalizes Understanding      Falls Prevention: - Provides verbal and written material to individual with discussion of falls prevention and safety. Flowsheet Row Cardiac Rehab from 05/03/2020 in [facility] Cardiac and Pulmonary Rehab  Date 03/01/20  Educator [facility]  Instruction Review Code 1- Verbalizes Understanding      Other: -Provides group and verbal instruction on various topics (see comments)   Knowledge Questionnaire Score:  Knowledge Questionnaire Score - 03/20/20 1600      Knowledge Questionnaire Score   Pre Score 23/26: Angina, exercise, trans fat           Core Components/Risk Factors/Patient Goals at Admission:  Personal Goals and Risk Factors at Admission - 04/06/20 1651      Core Components/Risk Factors/Patient Goals on Admission    Weight Management Yes;Weight  Loss    Intervention Weight Management: Develop a combined nutrition and exercise program designed to reach desired caloric intake, while maintaining appropriate intake of nutrient and fiber, sodium and fats, and appropriate energy expenditure required for the weight goal.;Weight Management: Provide education and appropriate resources to help participant work on and attain dietary goals.    Admit Weight 310 lb (140.6 kg)    Goal Weight: Short Term 305 lb (138.3 kg)    Goal Weight: Long Term 290 lb (131.5 kg)    Expected Outcomes Short Term: Continue to assess and modify interventions until short term weight is achieved;Long Term: Adherence to nutrition and physical activity/exercise program aimed toward attainment of established weight goal;Weight Loss: Understanding of general recommendations for a balanced deficit meal plan, which promotes 1-2 lb weight loss per week and includes a negative energy balance of [phone] kcal/d;Understanding recommendations for meals to include 15-35% energy as protein, 25-35% energy from fat, 35-60% energy from carbohydrates, less than 200mg  of dietary cholesterol, 20-35 gm of total fiber daily;Understanding of distribution of calorie intake throughout the day with the consumption of 4-5 meals/snacks    Heart Failure Yes    Intervention Provide a combined exercise and nutrition program that is supplemented with education, support and counseling about heart failure. Directed toward relieving symptoms such as shortness of breath, decreased exercise tolerance, and extremity edema.    Expected Outcomes Improve functional capacity of life;Short term: Attendance in program 2-3 days a week with increased exercise capacity. Reported lower sodium intake. Reported increased fruit and vegetable intake. Reports medication compliance.;Short term: Daily weights obtained and reported for increase. Utilizing diuretic protocols set by physician.;Long term: Adoption of self-care skills and  reduction of barriers for early signs and symptoms recognition and intervention leading to self-care maintenance.    Hypertension Yes    Intervention Provide education on lifestyle modifcations including regular physical activity/exercise, weight management, moderate sodium restriction and increased consumption of fresh fruit, vegetables, and low fat dairy, alcohol moderation, and smoking cessation.;Monitor prescription use compliance.  Expected Outcomes Short Term: Continued assessment and intervention until BP is < 140/4090mm HG in hypertensive participants. < 130/4380mm HG in hypertensive participants with diabetes, heart failure or chronic kidney disease.;Long Term: Maintenance of blood pressure at goal levels.    Lipids Yes    Intervention Provide education and support for participant on nutrition & aerobic/resistive exercise along with prescribed medications to achieve LDL 70mg , HDL >40mg .    Expected Outcomes Short Term: Participant states understanding of desired cholesterol values and is compliant with medications prescribed. Participant is following exercise prescription and nutrition guidelines.;Long Term: Cholesterol controlled with medications as prescribed, with individualized exercise RX and with personalized nutrition plan. Value goals: LDL < 70mg , HDL > 40 mg.           Education:Diabetes - Individual verbal and written instruction to review signs/symptoms of diabetes, desired ranges of glucose level fasting, after meals and with exercise. Acknowledge that pre and post exercise glucose checks will be done for 3 sessions at entry of program.   Core Components/Risk Factors/Patient Goals Review:    Core Components/Risk Factors/Patient Goals at Discharge (Final Review):    ITP Comments:  ITP Comments    Row Name 03/01/20 1542 03/20/20 1537 04/06/20 1626 04/06/20 1654 04/12/20 0557   ITP Comments Virtual Visit completed. Patient informed on EP and RD appointment and 6 Minute walk  test. Patient also informed of patient health questionnaires on My Chart. Patient Verbalizes understanding. [facility] Patient arrived to Rehab today, BP was 84/58. Patient asymptomatic besides feeling fatigued and SOB, which claims he has felt since his cardiac event in December. Patient's SO called doctor who advised to hold off on 2 medications tonight and will see doctor tomorrow morning. Patient is aware to call 911 if anything worsens or he feels worse. Completed 6MWT and gym orientation. Initial ITP created and sent for review to Dr. Bethann PunchesMark Miller, Medical Director. Lorin PicketScott is a current tobacco user. Intervention for tobacco cessation was provided at the initial medical review. He was asked about readiness to quit and reported he is ready to quit but has not set a quit date yet. He has decreased from 1/2 pack to about 4-5/ day. Patient was advised and educated about tobacco cessation using combination therapy, tobacco cessation classes, quit line, and quit smoking apps. Patient demonstrated understanding of this material. Staff will continue to provide encouragement and follow up with the patient throughout the program. 30 Day review completed. Medical Director ITP review done, changes made as directed, and signed approval by Medical Director.   Row Name 04/24/20 1836 05/01/20 1551 05/10/20 0700 06/06/20 0802 06/07/20 0816   ITP Comments Followed up with patient- states he had a lot of appointments this week and would like to push his start date for Heart Track for next Monday, 3/14. Patient agreed and confirmed time. First full day of exercise!  Patient was oriented to gym and equipment including functions, settings, policies, and procedures.  Patient's individual exercise prescription and treatment plan were reviewed.  All starting workloads were established based on the results of the 6 minute walk test done at initial orientation visit.  The plan for exercise progression  was also introduced and progression will be customized based on patient's performance and goals. 30 Day review completed. Medical Director ITP review done, changes made as directed, and signed approval by Medical Director. Sent letter on 4/13. Patient has not attended since 3/16. 30 Day review completed. Medical Director ITP review done,  changes made as directed, and signed approval by Medical Director.   Row Name 06/15/20 1423           ITP Comments Discharge ITP sent and signed by Dr. Hyacinth Meeker.  Discharge Summary routed to PCP and cardiologist.              Comments: Discharge ITP

## 2020-11-05 ENCOUNTER — Emergency Department: Payer: Medicaid Other

## 2020-11-05 ENCOUNTER — Other Ambulatory Visit: Payer: Self-pay

## 2020-11-05 ENCOUNTER — Inpatient Hospital Stay
Admission: EM | Admit: 2020-11-05 | Discharge: 2020-11-10 | DRG: 190 | Disposition: A | Discharge status: Home / Self Care | Payer: Medicaid Other | Attending: Internal Medicine | Admitting: Internal Medicine

## 2020-11-05 DIAGNOSIS — F17210 Nicotine dependence, cigarettes, uncomplicated: Secondary | ICD-10-CM | POA: Diagnosis present

## 2020-11-05 DIAGNOSIS — K219 Gastro-esophageal reflux disease without esophagitis: Secondary | ICD-10-CM | POA: Diagnosis present

## 2020-11-05 DIAGNOSIS — J189 Pneumonia, unspecified organism: Secondary | ICD-10-CM | POA: Diagnosis present

## 2020-11-05 DIAGNOSIS — I5043 Acute on chronic combined systolic (congestive) and diastolic (congestive) heart failure: Secondary | ICD-10-CM | POA: Diagnosis present

## 2020-11-05 DIAGNOSIS — Z8249 Family history of ischemic heart disease and other diseases of the circulatory system: Secondary | ICD-10-CM

## 2020-11-05 DIAGNOSIS — Z9119 Patient's noncompliance with other medical treatment and regimen: Secondary | ICD-10-CM

## 2020-11-05 DIAGNOSIS — Z6841 Body Mass Index (BMI) 40.0 and over, adult: Secondary | ICD-10-CM

## 2020-11-05 DIAGNOSIS — G894 Chronic pain syndrome: Secondary | ICD-10-CM | POA: Diagnosis present

## 2020-11-05 DIAGNOSIS — Z886 Allergy status to analgesic agent status: Secondary | ICD-10-CM

## 2020-11-05 DIAGNOSIS — M1990 Unspecified osteoarthritis, unspecified site: Secondary | ICD-10-CM | POA: Diagnosis present

## 2020-11-05 DIAGNOSIS — Z20822 Contact with and (suspected) exposure to covid-19: Secondary | ICD-10-CM | POA: Diagnosis present

## 2020-11-05 DIAGNOSIS — E785 Hyperlipidemia, unspecified: Secondary | ICD-10-CM | POA: Diagnosis present

## 2020-11-05 DIAGNOSIS — I110 Hypertensive heart disease with heart failure: Secondary | ICD-10-CM | POA: Diagnosis present

## 2020-11-05 DIAGNOSIS — Z888 Allergy status to other drugs, medicaments and biological substances status: Secondary | ICD-10-CM

## 2020-11-05 DIAGNOSIS — I248 Other forms of acute ischemic heart disease: Secondary | ICD-10-CM | POA: Diagnosis present

## 2020-11-05 DIAGNOSIS — J440 Chronic obstructive pulmonary disease with acute lower respiratory infection: Secondary | ICD-10-CM | POA: Diagnosis present

## 2020-11-05 DIAGNOSIS — J441 Chronic obstructive pulmonary disease with (acute) exacerbation: Principal | ICD-10-CM

## 2020-11-05 DIAGNOSIS — Z8673 Personal history of transient ischemic attack (TIA), and cerebral infarction without residual deficits: Secondary | ICD-10-CM

## 2020-11-05 DIAGNOSIS — I420 Dilated cardiomyopathy: Secondary | ICD-10-CM

## 2020-11-05 DIAGNOSIS — J9602 Acute respiratory failure with hypercapnia: Secondary | ICD-10-CM | POA: Diagnosis present

## 2020-11-05 DIAGNOSIS — Z7982 Long term (current) use of aspirin: Secondary | ICD-10-CM

## 2020-11-05 DIAGNOSIS — F32A Depression, unspecified: Secondary | ICD-10-CM | POA: Diagnosis present

## 2020-11-05 DIAGNOSIS — I252 Old myocardial infarction: Secondary | ICD-10-CM

## 2020-11-05 DIAGNOSIS — I10 Essential (primary) hypertension: Secondary | ICD-10-CM

## 2020-11-05 DIAGNOSIS — I5023 Acute on chronic systolic (congestive) heart failure: Secondary | ICD-10-CM

## 2020-11-05 DIAGNOSIS — Z96611 Presence of right artificial shoulder joint: Secondary | ICD-10-CM | POA: Diagnosis present

## 2020-11-05 DIAGNOSIS — F17200 Nicotine dependence, unspecified, uncomplicated: Secondary | ICD-10-CM

## 2020-11-05 DIAGNOSIS — J9601 Acute respiratory failure with hypoxia: Secondary | ICD-10-CM

## 2020-11-05 DIAGNOSIS — J9600 Acute respiratory failure, unspecified whether with hypoxia or hypercapnia: Secondary | ICD-10-CM | POA: Diagnosis present

## 2020-11-05 DIAGNOSIS — Z79899 Other long term (current) drug therapy: Secondary | ICD-10-CM

## 2020-11-05 DIAGNOSIS — E66813 Obesity, class 3: Secondary | ICD-10-CM

## 2020-11-05 DIAGNOSIS — Z96612 Presence of left artificial shoulder joint: Secondary | ICD-10-CM | POA: Diagnosis present

## 2020-11-05 DIAGNOSIS — Z955 Presence of coronary angioplasty implant and graft: Secondary | ICD-10-CM

## 2020-11-05 DIAGNOSIS — G4733 Obstructive sleep apnea (adult) (pediatric): Secondary | ICD-10-CM | POA: Diagnosis present

## 2020-11-05 DIAGNOSIS — I2510 Atherosclerotic heart disease of native coronary artery without angina pectoris: Secondary | ICD-10-CM | POA: Diagnosis present

## 2020-11-05 DIAGNOSIS — Z7951 Long term (current) use of inhaled steroids: Secondary | ICD-10-CM

## 2020-11-05 LAB — CBC WITH DIFFERENTIAL/PLATELET
Abs Immature Granulocytes: 0.04 10*3/uL (ref 0.00–0.07)
Basophils Absolute: 0 10*3/uL (ref 0.0–0.1)
Basophils Relative: 0 %
Eosinophils Absolute: 0.4 10*3/uL (ref 0.0–0.5)
Eosinophils Relative: 4 %
HCT: 37 % — ABNORMAL LOW (ref 39.0–52.0)
Hemoglobin: 12.5 g/dL — ABNORMAL LOW (ref 13.0–17.0)
Immature Granulocytes: 0 %
Lymphocytes Relative: 6 %
Lymphs Abs: 0.6 10*3/uL — ABNORMAL LOW (ref 0.7–4.0)
MCH: 33.3 pg (ref 26.0–34.0)
MCHC: 33.8 g/dL (ref 30.0–36.0)
MCV: 98.7 fL (ref 80.0–100.0)
Monocytes Absolute: 0.5 10*3/uL (ref 0.1–1.0)
Monocytes Relative: 5 %
Neutro Abs: 8.4 10*3/uL — ABNORMAL HIGH (ref 1.7–7.7)
Neutrophils Relative %: 85 %
Platelets: 224 10*3/uL (ref 150–400)
RBC: 3.75 MIL/uL — ABNORMAL LOW (ref 4.22–5.81)
RDW: 14 % (ref 11.5–15.5)
WBC: 10 10*3/uL (ref 4.0–10.5)
nRBC: 0 % (ref 0.0–0.2)

## 2020-11-05 LAB — TROPONIN I (HIGH SENSITIVITY): Troponin I (High Sensitivity): 26 ng/L — ABNORMAL HIGH (ref <18)

## 2020-11-05 LAB — RESP PANEL BY RT-PCR (FLU A&B, COVID) ARPGX2
Influenza A by PCR: NEGATIVE
Influenza B by PCR: NEGATIVE
SARS Coronavirus 2 by RT PCR: NEGATIVE

## 2020-11-05 LAB — BLOOD GAS, ARTERIAL
Acid-Base Excess: 10.4 mmol/L — ABNORMAL HIGH (ref 0.0–2.0)
Bicarbonate: 36.1 mmol/L — ABNORMAL HIGH (ref 20.0–28.0)
Expiratory PAP: 8
FIO2: 35
Inspiratory PAP: 12
Mechanical Rate: 12
Mode: POSITIVE
O2 Saturation: 99.4 %
Patient temperature: 37
pCO2 arterial: 52 mmHg — ABNORMAL HIGH (ref 32.0–48.0)
pH, Arterial: 7.45 (ref 7.350–7.450)
pO2, Arterial: 150 mmHg — ABNORMAL HIGH (ref 83.0–108.0)

## 2020-11-05 LAB — COMPREHENSIVE METABOLIC PANEL
ALT: 17 U/L (ref 0–44)
AST: 23 U/L (ref 15–41)
Albumin: 3.6 g/dL (ref 3.5–5.0)
Alkaline Phosphatase: 80 U/L (ref 38–126)
Anion gap: 8 (ref 5–15)
BUN: 11 mg/dL (ref 6–20)
CO2: 35 mmol/L — ABNORMAL HIGH (ref 22–32)
Calcium: 9 mg/dL (ref 8.9–10.3)
Chloride: 93 mmol/L — ABNORMAL LOW (ref 98–111)
Creatinine, Ser: 1 mg/dL (ref 0.61–1.24)
GFR, Estimated: >60 mL/min (ref >60)
Glucose, Bld: 133 mg/dL — ABNORMAL HIGH (ref 70–99)
Potassium: 3.6 mmol/L (ref 3.5–5.1)
Sodium: 136 mmol/L (ref 135–145)
Total Bilirubin: 1.2 mg/dL (ref 0.3–1.2)
Total Protein: 7.4 g/dL (ref 6.5–8.1)

## 2020-11-05 LAB — ETHANOL: Alcohol, Ethyl (B): <10 mg/dL (ref <10)

## 2020-11-05 LAB — BRAIN NATRIURETIC PEPTIDE: B Natriuretic Peptide: 664.4 pg/mL — ABNORMAL HIGH (ref 0.0–100.0)

## 2020-11-05 MED ORDER — ALBUTEROL SULFATE (2.5 MG/3ML) 0.083% IN NEBU
5.0000 mg | INHALATION_SOLUTION | Freq: Once | RESPIRATORY_TRACT | Status: AC
  Administered 2020-11-05: 5 mg via RESPIRATORY_TRACT
  Filled 2020-11-05: qty 6

## 2020-11-05 MED ORDER — SODIUM CHLORIDE 0.9 % IV SOLN
500.0000 mg | Freq: Once | INTRAVENOUS | Status: AC
  Administered 2020-11-05: 500 mg via INTRAVENOUS
  Filled 2020-11-05: qty 500

## 2020-11-05 MED ORDER — IPRATROPIUM BROMIDE 0.02 % IN SOLN
0.5000 mg | Freq: Once | RESPIRATORY_TRACT | Status: AC
  Administered 2020-11-05: 0.5 mg via RESPIRATORY_TRACT
  Filled 2020-11-05: qty 2.5

## 2020-11-05 MED ORDER — METHYLPREDNISOLONE SODIUM SUCC 125 MG IJ SOLR
125.0000 mg | Freq: Once | INTRAMUSCULAR | Status: AC
  Administered 2020-11-05: 125 mg via INTRAVENOUS
  Filled 2020-11-05: qty 2

## 2020-11-05 MED ORDER — SODIUM CHLORIDE 0.9 % IV SOLN
1.0000 g | Freq: Once | INTRAVENOUS | Status: AC
  Administered 2020-11-05: 1 g via INTRAVENOUS
  Filled 2020-11-05: qty 10

## 2020-11-05 NOTE — ED Notes (Signed)
RT & Phlebotomy @ the bedside.

## 2020-11-05 NOTE — ED Triage Notes (Signed)
Pt presents from home with complaints of difficulty breathing. Pt has a hx of COPD and has had a cold for the last several days. PTA he received 5mg  of albuterol breathing treatment and was initially 76% on RA. Pt arrived on a non rebreather mask with a treatment running with a sat of 100%.

## 2020-11-05 NOTE — ED Notes (Signed)
RT called to establish bipap

## 2020-11-05 NOTE — ED Provider Notes (Signed)
[facility] Emergency Department Provider Note  ____________________________________________   Event Date/Time   First MD Initiated Contact with Patient 11/05/20 2257     (approximate)  I have reviewed the triage vital signs and the nursing notes.   HISTORY  Chief Complaint Respiratory Distress    HPI Tommy Gaines is a 55 y.o. male with history of CAD, COPD not on home oxygen, hypertension, hyperlipidemia, CVA, chronic pain, alcohol use who presents to the emergency department with complaints of shortness of breath, cough that started yesterday.  Denies any chest pain, fever.  Was found to be hypoxic at 76% on room air with EMS.  Improved on nonrebreather.  Received 1 nebulizer treatment in route.  States he is not sure if he has a history of CHF.        Past Medical History:  Diagnosis Date   Arthritis    COPD (chronic obstructive pulmonary disease) (HCC)    Depression    Diverticulitis    Hyperlipidemia    Hypertension    Stroke [facility]) 2016   confirmed by CT scan     Patient Active Problem List   Diagnosis Date Noted   Endotracheally intubated    Alcohol use    Acute encephalopathy    Non-ST elevation (NSTEMI) myocardial infarction [facility])    Acute systolic heart failure (HCC)    Respiratory failure (HCC) 01/29/2020   Acute respiratory failure (HCC) 01/29/2020   Chronic pain syndrome 09/10/2017   Primary osteoarthritis of both knees 09/10/2017   Primary osteoarthritis of both shoulders 09/10/2017   GERD (gastroesophageal reflux disease) 10/17/2016   Chronic pain of both knees 10/03/2015   Chronic pain of both shoulders 10/03/2015   History of replacement of both shoulder joints 10/03/2015   Tendonitis of elbow, left 08/13/2012   Obesity 08/15/2011   Chronic airway obstruction (HCC) 07/30/2011    Past Surgical History:  Procedure Laterality Date   CORONARY STENT INTERVENTION N/A 01/30/2020   Procedure: CORONARY STENT INTERVENTION;   Surgeon: Elder Negus, MD;  Location: [facility] INVASIVE CV LAB;  Service: Cardiovascular;  Laterality: N/A;   CORONARY STENT INTERVENTION N/A 02/01/2020   Procedure: CORONARY STENT INTERVENTION;  Surgeon: Yates Decamp, MD;  Location: [facility] INVASIVE CV LAB;  Service: Cardiovascular;  Laterality: N/A;   intestinal rupture  2012   colostomy with take down as a result of this surgery.  colostomy reversed 6 months post surgery.   JOINT REPLACEMENT Bilateral    shoulders   RIGHT/LEFT HEART CATH AND CORONARY ANGIOGRAPHY N/A 01/30/2020   Procedure: RIGHT/LEFT HEART CATH AND CORONARY ANGIOGRAPHY;  Surgeon: Elder Negus, MD;  Location: [facility] INVASIVE CV LAB;  Service: Cardiovascular;  Laterality: N/A;    Prior to Admission medications   Medication Sig Start Date End Date Taking? Authorizing Provider  albuterol (PROVENTIL HFA;VENTOLIN HFA) 108 (90 Base) MCG/ACT inhaler Inhale 2 puffs into the lungs 3 (three) times daily. 09/18/15   [provider]  aspirin EC 81 MG tablet Take 81 mg by mouth.    [provider]  atorvastatin (LIPITOR) 80 MG tablet Take 1 tablet (80 mg total) by mouth daily. 02/04/20   Orpah Cobb, MD  budesonide-formoterol (SYMBICORT) 160-4.5 MCG/ACT inhaler Inhale 1 puff into the lungs 2 (two) times daily. 09/30/16 01/30/20  [provider]  carvedilol (COREG) 3.125 MG tablet Take 1 tablet (3.125 mg total) by mouth 2 (two) times daily with a meal. 02/03/20   Orpah Cobb, MD  docusate sodium (COLACE) 100 MG  capsule Take 1 capsule (100 mg total) by mouth 2 (two) times daily. 02/03/20   Orpah Cobb, MD  FLUoxetine (PROZAC) 20 MG capsule Take by mouth. 02/08/20 02/07/21  [provider]  folic acid (FOLVITE) 1 MG tablet Take 1 tablet (1 mg total) by mouth daily. 02/04/20   Orpah Cobb, MD  furosemide (LASIX) 20 MG tablet Take 20 mg by mouth daily. 09/18/15   [provider]  gabapentin (NEURONTIN) 300 MG capsule Take by mouth. 02/08/20  02/07/21  [provider]  omeprazole (PRILOSEC) 20 MG capsule Take 20 mg by mouth daily as needed (Acid reflux). 09/18/15   [provider]  oxyCODONE (OXY IR/ROXICODONE) 5 MG immediate release tablet Take 1 tablet (5 mg total) by mouth every 6 (six) hours as needed for severe pain. 02/03/20   Orpah Cobb, MD  polyethylene glycol (MIRALAX / GLYCOLAX) 17 g packet Take 17 g by mouth daily. 02/04/20   Orpah Cobb, MD  potassium chloride (KLOR-CON) 10 MEQ tablet Take 1 tablet (10 mEq total) by mouth daily. 02/04/20   Orpah Cobb, MD  sacubitril-valsartan (ENTRESTO) 24-26 MG Take 1 tablet by mouth 2 (two) times daily. 02/03/20   Orpah Cobb, MD  spironolactone (ALDACTONE) 25 MG tablet Take 1 tablet (25 mg total) by mouth daily. 02/04/20   Orpah Cobb, MD  thiamine 100 MG tablet Take 1 tablet (100 mg total) by mouth daily. 02/04/20   Orpah Cobb, MD  ticagrelor (BRILINTA) 90 MG TABS tablet Take 1 tablet (90 mg total) by mouth 2 (two) times daily. 02/03/20   Orpah Cobb, MD  tiotropium (SPIRIVA) 18 MCG inhalation capsule Place 18 mcg into inhaler and inhale daily. 09/18/15   [provider]    Allergies Acetaminophen and Lidocaine  Family History  Problem Relation Age of Onset   Cancer Mother    Diabetes Mother    Heart disease Father     Social History Social History   Tobacco Use   Smoking status: Some Days    Packs/day: 0.25    Years: 15.00    Pack years: 3.75    Types: Cigarettes   Smokeless tobacco: Former   Tobacco comments:    He smokes 2-4 Cigarettes a day  Vaping Use   Vaping Use: Never used  Substance Use Topics   Alcohol use: No   Drug use: No    Review of Systems Level 5 caveat secondary to respiratory distress  ____________________________________________   PHYSICAL EXAM:  VITAL SIGNS: ED Triage Vitals  Enc Vitals Group     BP 11/05/20 2235 (!) 143/88     Pulse Rate 11/05/20 2235 (!) 103     Resp 11/05/20 2235 20      Temp 11/05/20 2235 99 F (37.2 C)     Temp Source 11/05/20 2235 Oral     SpO2 11/05/20 2235 93 %     Weight 11/05/20 2237 (!) 333 lb 12.4 oz (151.4 kg)     Height 11/05/20 2237 6\' 1"  (1.854 m)     Head Circumference --      Peak Flow --      Pain Score 11/05/20 2236 6     Pain Loc --      Pain Edu? --      Excl. in GC? --    CONSTITUTIONAL: Alert and oriented and responds appropriately to questions.  Patient in respiratory distress.  Obese. HEAD: Normocephalic EYES: Conjunctivae clear, pupils appear equal, EOM appear intact ENT: normal nose; moist mucous  membranes NECK: Supple, normal ROM CARD: Regular and tachycardic; S1 and S2 appreciated; no murmurs, no clicks, no rubs, no gallops RESP: Patient is tachypneic.  Diminished breath sounds at bases bilaterally.  Diffuse inspiratory and expiratory wheezes.  No rhonchi or rales.  Breath sounds heard bilaterally.  Speaking 1-2 words at a time.  Patient is in respiratory distress. ABD/GI: Normal bowel sounds; non-distended; soft, non-tender, no rebound, no guarding, no peritoneal signs, no hepatosplenomegaly BACK: The back appears normal EXT: Normal ROM in all joints; no deformity noted, + 1 pitting edema in bilateral lower extremities; no cyanosis, no calf tenderness or calf swelling SKIN: Normal color for age and race; warm; no rash on exposed skin NEURO: Moves all extremities equally PSYCH: The patient's mood and manner are appropriate.  ____________________________________________   LABS (all labs ordered are listed, but only abnormal results are displayed)  Labs Reviewed  CBC WITH DIFFERENTIAL/PLATELET - Abnormal; Notable for the following components:      Result Value   RBC 3.75 (*)    Hemoglobin 12.5 (*)    HCT 37.0 (*)    Neutro Abs 8.4 (*)    Lymphs Abs 0.6 (*)    All other components within normal limits  RESP PANEL BY RT-PCR (FLU A&B, COVID) ARPGX2  CULTURE, BLOOD (ROUTINE X 2)  CULTURE, BLOOD (ROUTINE X 2)   COMPREHENSIVE METABOLIC PANEL  BRAIN NATRIURETIC PEPTIDE  PROCALCITONIN  BLOOD GAS, ARTERIAL  ETHANOL  TROPONIN I (HIGH SENSITIVITY)   ____________________________________________  EKG   EKG Interpretation  Date/Time:  Sunday November 05 2020 22:45:23 EDT Ventricular Rate:  101 PR Interval:  167 QRS Duration: 95 QT Interval:  361 QTC Calculation: 468 R Axis:   269 Text Interpretation: Sinus tachycardia Multiform ventricular premature complexes Left anterior fascicular block Low voltage, precordial leads Probable anteroseptal infarct, old Baseline wander in lead(s) V3 Confirmed by Rochele Raring [phone]) on 11/05/2020 11:02:27 PM        ____________________________________________  RADIOLOGY Normajean Baxter Acelynn Dejonge, personally viewed and evaluated these images (plain radiographs) as part of my medical decision making, as well as reviewing the written report by the radiologist.  ED MD interpretation: Left lower lobe pneumonia.  Official radiology report(s): DG Chest Portable 1 View  Result Date: 11/05/2020 CLINICAL DATA:  Shortness of breath. Difficulty breathing. COPD and cold symptoms. EXAM: PORTABLE CHEST 1 VIEW COMPARISON:  01/31/2020 FINDINGS: Cardiac enlargement. Possible patchy perihilar infiltration on the left. This may indicate pneumonia or early edema. No pleural effusions. No pneumothorax. Tortuous aorta. Postoperative changes in the left shoulder. IMPRESSION: Cardiac enlargement. Suggestion of patchy perihilar infiltration on the left. Electronically Signed   By: Burman Nieves M.D.   On: 11/05/2020 22:56    ____________________________________________   PROCEDURES  Procedure(s) performed (including Critical Care):  Procedures  CRITICAL CARE Performed by: Baxter Hire Marianne Golightly   Total critical care time: 55 minutes  Critical care time was exclusive of separately billable procedures and treating other patients.  Critical care was necessary to treat or prevent  imminent or life-threatening deterioration.  Critical care was time spent personally by me on the following activities: development of treatment plan with patient and/or surrogate as well as nursing, discussions with consultants, evaluation of patient's response to treatment, examination of patient, obtaining history from patient or surrogate, ordering and performing treatments and interventions, ordering and review of laboratory studies, ordering and review of radiographic studies, pulse oximetry and re-evaluation of patient's condition.  ____________________________________________   INITIAL IMPRESSION / ASSESSMENT AND PLAN / ED  COURSE  As part of my medical decision making, I reviewed the following data within the electronic MEDICAL RECORD NUMBER History obtained from family, Nursing notes reviewed and incorporated, Labs reviewed , EKG interpreted , Old EKG reviewed, Old chart reviewed, Radiograph reviewed , Discussed with admitting physician , and Notes from prior ED visits         Patient here in respiratory distress.  Found to be hypoxic with EMS.  Patient with history of COPD.  Also reports cough but no fever.  Oral temp here of 99.  Will check rectal temp.  Chest x-ray concerning for left lower lobe pneumonia.  COVID, flu swabs pending.  Will give Rocephin, azithromycin.  Will place patient on BiPAP due to increased work of breathing.  He will need admission.  ED PROGRESS  Patient appears to be improving clinically on BiPAP.  ABG appears compensated.  Will discuss with medicine for admission for pneumonia, COPD exacerbation.   12:31 AM Discussed patient's case with hospitalist, Dr. Para March.  I have recommended admission and patient (and family if present) agree with this plan. Admitting physician will place admission orders.   I reviewed all nursing notes, vitals, pertinent previous records and reviewed/interpreted all EKGs, lab and urine results, imaging (as  available).  ____________________________________________   FINAL CLINICAL IMPRESSION(S) / ED DIAGNOSES  Final diagnoses:  Acute respiratory failure with hypoxia (HCC)  Community acquired pneumonia of left lower lobe of lung  COPD exacerbation [facility])     ED Discharge Orders     None       *Please note:  Edenilson Austad was evaluated in Emergency Department on 11/05/2020 for the symptoms described in the history of present illness. He was evaluated in the context of the global COVID-19 pandemic, which necessitated consideration that the patient might be at risk for infection with the SARS-CoV-2 virus that causes COVID-19. Institutional protocols and algorithms that pertain to the evaluation of patients at risk for COVID-19 are in a state of rapid change based on information released by regulatory bodies including the CDC and federal and state organizations. These policies and algorithms were followed during the patient's care in the ED.  Some ED evaluations and interventions may be delayed as a result of limited staffing during and the pandemic.*   Note:  This document was prepared using Dragon voice recognition software and may include unintentional dictation errors.    Laysha Childers, Layla Maw, DO 11/06/20 [phone]

## 2020-11-06 DIAGNOSIS — I2510 Atherosclerotic heart disease of native coronary artery without angina pectoris: Secondary | ICD-10-CM | POA: Diagnosis present

## 2020-11-06 DIAGNOSIS — J441 Chronic obstructive pulmonary disease with (acute) exacerbation: Principal | ICD-10-CM

## 2020-11-06 DIAGNOSIS — K219 Gastro-esophageal reflux disease without esophagitis: Secondary | ICD-10-CM | POA: Diagnosis present

## 2020-11-06 DIAGNOSIS — J9602 Acute respiratory failure with hypercapnia: Secondary | ICD-10-CM

## 2020-11-06 DIAGNOSIS — I5043 Acute on chronic combined systolic (congestive) and diastolic (congestive) heart failure: Secondary | ICD-10-CM | POA: Diagnosis present

## 2020-11-06 DIAGNOSIS — J9601 Acute respiratory failure with hypoxia: Secondary | ICD-10-CM | POA: Diagnosis present

## 2020-11-06 DIAGNOSIS — F17200 Nicotine dependence, unspecified, uncomplicated: Secondary | ICD-10-CM

## 2020-11-06 DIAGNOSIS — M1990 Unspecified osteoarthritis, unspecified site: Secondary | ICD-10-CM | POA: Diagnosis present

## 2020-11-06 DIAGNOSIS — J9600 Acute respiratory failure, unspecified whether with hypoxia or hypercapnia: Secondary | ICD-10-CM | POA: Diagnosis present

## 2020-11-06 DIAGNOSIS — I420 Dilated cardiomyopathy: Secondary | ICD-10-CM

## 2020-11-06 DIAGNOSIS — I248 Other forms of acute ischemic heart disease: Secondary | ICD-10-CM | POA: Diagnosis present

## 2020-11-06 DIAGNOSIS — Z96612 Presence of left artificial shoulder joint: Secondary | ICD-10-CM | POA: Diagnosis present

## 2020-11-06 DIAGNOSIS — F32A Depression, unspecified: Secondary | ICD-10-CM | POA: Diagnosis present

## 2020-11-06 DIAGNOSIS — Z8673 Personal history of transient ischemic attack (TIA), and cerebral infarction without residual deficits: Secondary | ICD-10-CM | POA: Diagnosis not present

## 2020-11-06 DIAGNOSIS — I110 Hypertensive heart disease with heart failure: Secondary | ICD-10-CM | POA: Diagnosis present

## 2020-11-06 DIAGNOSIS — J189 Pneumonia, unspecified organism: Secondary | ICD-10-CM | POA: Diagnosis present

## 2020-11-06 DIAGNOSIS — F17210 Nicotine dependence, cigarettes, uncomplicated: Secondary | ICD-10-CM

## 2020-11-06 DIAGNOSIS — I5023 Acute on chronic systolic (congestive) heart failure: Secondary | ICD-10-CM | POA: Diagnosis not present

## 2020-11-06 DIAGNOSIS — G4733 Obstructive sleep apnea (adult) (pediatric): Secondary | ICD-10-CM | POA: Diagnosis present

## 2020-11-06 DIAGNOSIS — G894 Chronic pain syndrome: Secondary | ICD-10-CM | POA: Diagnosis present

## 2020-11-06 DIAGNOSIS — Z20822 Contact with and (suspected) exposure to covid-19: Secondary | ICD-10-CM | POA: Diagnosis present

## 2020-11-06 DIAGNOSIS — Z96611 Presence of right artificial shoulder joint: Secondary | ICD-10-CM | POA: Diagnosis present

## 2020-11-06 DIAGNOSIS — R531 Weakness: Secondary | ICD-10-CM

## 2020-11-06 DIAGNOSIS — E785 Hyperlipidemia, unspecified: Secondary | ICD-10-CM | POA: Diagnosis present

## 2020-11-06 DIAGNOSIS — Z6841 Body Mass Index (BMI) 40.0 and over, adult: Secondary | ICD-10-CM | POA: Diagnosis not present

## 2020-11-06 DIAGNOSIS — J440 Chronic obstructive pulmonary disease with acute lower respiratory infection: Secondary | ICD-10-CM | POA: Diagnosis present

## 2020-11-06 DIAGNOSIS — I10 Essential (primary) hypertension: Secondary | ICD-10-CM

## 2020-11-06 LAB — PROCALCITONIN: Procalcitonin: <0.1 ng/mL

## 2020-11-06 LAB — TROPONIN I (HIGH SENSITIVITY): Troponin I (High Sensitivity): 38 ng/L — ABNORMAL HIGH (ref <18)

## 2020-11-06 LAB — HIV ANTIBODY (ROUTINE TESTING W REFLEX): HIV Screen 4th Generation wRfx: NONREACTIVE

## 2020-11-06 MED ORDER — FUROSEMIDE 10 MG/ML IJ SOLN
40.0000 mg | Freq: Two times a day (BID) | INTRAMUSCULAR | Status: DC
  Administered 2020-11-06 – 2020-11-09 (×7): 40 mg via INTRAVENOUS
  Filled 2020-11-06 (×7): qty 4

## 2020-11-06 MED ORDER — SODIUM CHLORIDE 0.9% FLUSH
3.0000 mL | Freq: Two times a day (BID) | INTRAVENOUS | Status: DC
  Administered 2020-11-06 – 2020-11-10 (×8): 3 mL via INTRAVENOUS

## 2020-11-06 MED ORDER — ACETAMINOPHEN 325 MG PO TABS: 650.0000 mg | ORAL_TABLET | ORAL | Status: DC | PRN

## 2020-11-06 MED ORDER — FOLIC ACID 1 MG PO TABS
1.0000 mg | ORAL_TABLET | Freq: Every day | ORAL | Status: DC
  Administered 2020-11-06 – 2020-11-10 (×5): 1 mg via ORAL
  Filled 2020-11-06 (×5): qty 1

## 2020-11-06 MED ORDER — UMECLIDINIUM BROMIDE 62.5 MCG/INH IN AEPB
1.0000 | INHALATION_SPRAY | Freq: Every day | RESPIRATORY_TRACT | Status: DC
  Administered 2020-11-06 – 2020-11-10 (×5): 1 via RESPIRATORY_TRACT
  Filled 2020-11-06: qty 7

## 2020-11-06 MED ORDER — FLUTICASONE FUROATE-VILANTEROL 100-25 MCG/INH IN AEPB
1.0000 | INHALATION_SPRAY | Freq: Every day | RESPIRATORY_TRACT | Status: DC
  Administered 2020-11-06 – 2020-11-10 (×5): 1 via RESPIRATORY_TRACT
  Filled 2020-11-06: qty 28

## 2020-11-06 MED ORDER — SODIUM CHLORIDE 0.9 % IV SOLN
2.0000 g | INTRAVENOUS | Status: DC
  Administered 2020-11-06 – 2020-11-09 (×4): 2 g via INTRAVENOUS
  Filled 2020-11-06 (×3): qty 20
  Filled 2020-11-06: qty 2
  Filled 2020-11-06: qty 20

## 2020-11-06 MED ORDER — SODIUM CHLORIDE 0.9% FLUSH
3.0000 mL | INTRAVENOUS | Status: DC | PRN
  Administered 2020-11-06: 3 mL via INTRAVENOUS

## 2020-11-06 MED ORDER — IPRATROPIUM-ALBUTEROL 0.5-2.5 (3) MG/3ML IN SOLN
3.0000 mL | RESPIRATORY_TRACT | Status: DC | PRN
  Administered 2020-11-06: 3 mL via RESPIRATORY_TRACT
  Filled 2020-11-06: qty 3

## 2020-11-06 MED ORDER — ALBUTEROL SULFATE (2.5 MG/3ML) 0.083% IN NEBU
5.0000 mg | INHALATION_SOLUTION | Freq: Once | RESPIRATORY_TRACT | Status: AC
  Administered 2020-11-06: 5 mg via RESPIRATORY_TRACT
  Filled 2020-11-06: qty 6

## 2020-11-06 MED ORDER — AZITHROMYCIN 500 MG IV SOLR
500.0000 mg | INTRAVENOUS | Status: DC
  Administered 2020-11-07 – 2020-11-08 (×2): 500 mg via INTRAVENOUS
  Filled 2020-11-06 (×3): qty 500

## 2020-11-06 MED ORDER — ENOXAPARIN SODIUM 80 MG/0.8ML IJ SOSY
0.5000 mg/kg | PREFILLED_SYRINGE | INTRAMUSCULAR | Status: DC
  Administered 2020-11-06 – 2020-11-10 (×5): 75 mg via SUBCUTANEOUS
  Filled 2020-11-06: qty 0.8
  Filled 2020-11-06 (×2): qty 0.75
  Filled 2020-11-06: qty 0.8
  Filled 2020-11-06: qty 0.75
  Filled 2020-11-06: qty 0.8
  Filled 2020-11-06 (×2): qty 0.75

## 2020-11-06 MED ORDER — LISINOPRIL 5 MG PO TABS
5.0000 mg | ORAL_TABLET | Freq: Every day | ORAL | Status: DC
  Administered 2020-11-06: 5 mg via ORAL
  Filled 2020-11-06: qty 1

## 2020-11-06 MED ORDER — ONDANSETRON HCL 4 MG/2ML IJ SOLN: 4.0000 mg | Freq: Four times a day (QID) | INTRAMUSCULAR | Status: DC | PRN

## 2020-11-06 MED ORDER — ATORVASTATIN CALCIUM 80 MG PO TABS
80.0000 mg | ORAL_TABLET | Freq: Every day | ORAL | Status: DC
  Administered 2020-11-06 – 2020-11-09 (×4): 80 mg via ORAL
  Filled 2020-11-06: qty 4
  Filled 2020-11-06 (×3): qty 1

## 2020-11-06 MED ORDER — PANTOPRAZOLE SODIUM 40 MG PO TBEC
40.0000 mg | DELAYED_RELEASE_TABLET | Freq: Every day | ORAL | Status: DC
  Administered 2020-11-06 – 2020-11-10 (×5): 40 mg via ORAL
  Filled 2020-11-06 (×5): qty 1

## 2020-11-06 MED ORDER — CARVEDILOL 3.125 MG PO TABS
3.1250 mg | ORAL_TABLET | Freq: Two times a day (BID) | ORAL | Status: DC
  Administered 2020-11-06 – 2020-11-10 (×9): 3.125 mg via ORAL
  Filled 2020-11-06 (×9): qty 1

## 2020-11-06 MED ORDER — IPRATROPIUM BROMIDE 0.02 % IN SOLN
0.5000 mg | Freq: Once | RESPIRATORY_TRACT | Status: AC
  Administered 2020-11-06: 0.5 mg via RESPIRATORY_TRACT
  Filled 2020-11-06: qty 2.5

## 2020-11-06 MED ORDER — IBUPROFEN 400 MG PO TABS
200.0000 mg | ORAL_TABLET | Freq: Once | ORAL | Status: AC
  Administered 2020-11-07: 200 mg via ORAL
  Filled 2020-11-06: qty 1

## 2020-11-06 MED ORDER — METHYLPREDNISOLONE SODIUM SUCC 40 MG IJ SOLR
40.0000 mg | Freq: Two times a day (BID) | INTRAMUSCULAR | Status: DC
  Administered 2020-11-06: 40 mg via INTRAVENOUS
  Filled 2020-11-06: qty 1

## 2020-11-06 MED ORDER — PREDNISONE 20 MG PO TABS: 40.0000 mg | ORAL_TABLET | Freq: Every day | ORAL | Status: DC

## 2020-11-06 MED ORDER — CLOPIDOGREL BISULFATE 75 MG PO TABS
75.0000 mg | ORAL_TABLET | Freq: Every morning | ORAL | Status: DC
  Administered 2020-11-06 – 2020-11-10 (×5): 75 mg via ORAL
  Filled 2020-11-06 (×5): qty 1

## 2020-11-06 MED ORDER — ASPIRIN EC 81 MG PO TBEC: 81.0000 mg | DELAYED_RELEASE_TABLET | Freq: Every day | ORAL | Status: DC

## 2020-11-06 MED ORDER — SODIUM CHLORIDE 0.9 % IV SOLN: 250.0000 mL | INTRAVENOUS | Status: DC | PRN

## 2020-11-06 MED ORDER — CEFTRIAXONE SODIUM 1 G IJ SOLR: 1.0000 g | INTRAMUSCULAR | Status: DC

## 2020-11-06 MED ORDER — GABAPENTIN 300 MG PO CAPS
300.0000 mg | ORAL_CAPSULE | Freq: Three times a day (TID) | ORAL | Status: DC
  Administered 2020-11-06 – 2020-11-10 (×13): 300 mg via ORAL
  Filled 2020-11-06 (×13): qty 1

## 2020-11-06 MED ORDER — NICOTINE 21 MG/24HR TD PT24
21.0000 mg | MEDICATED_PATCH | Freq: Every day | TRANSDERMAL | Status: DC
  Administered 2020-11-06 – 2020-11-10 (×5): 21 mg via TRANSDERMAL
  Filled 2020-11-06 (×5): qty 1

## 2020-11-06 MED ORDER — ASPIRIN EC 81 MG PO TBEC
81.0000 mg | DELAYED_RELEASE_TABLET | Freq: Every day | ORAL | Status: DC
  Administered 2020-11-06 – 2020-11-10 (×5): 81 mg via ORAL
  Filled 2020-11-06 (×5): qty 1

## 2020-11-06 NOTE — Progress Notes (Signed)
PROGRESS NOTE  Tommy Gaines KTG:[id number] DOB: Apr 20, 1965   PCP: Marta Antu, DO  Patient is from: Home.  Independently ambulates at baseline.  Lives with his mother.  DOA: 11/05/2020 LOS: 0  Chief complaints:  Chief Complaint  Patient presents with   Respiratory Distress     Brief Narrative / Interim history: 55 year old M with PMH of CAD s/p stents in 01/2020, combined CHF, COPD, CVA, HTN, GERD, osteoarthritis, depression, morbid obesity and ongoing tobacco use presenting with shortness of breath and hypoxia to 76% on room air, and admitted for COPD exacerbation, CHF exacerbation and possible pneumonia.  He required nonrebreather en route to ED. ABG 7.4 5/52/150/36.  BNP elevated to 665.  Mild troponin elevation to 26>> 28.  Pro-Cal negative.  CXR with cardiomegaly and possible left perihilar infiltrate.  He was started on IV diuretics, steroid with breathing treatments and antibiotics.  Subjective: Seen and examined earlier this morning.  He reports improvement in his breathing although he seems to be breathing fast and a little harder.  He also have dry cough.  He denies chest pain, GI or UTI symptoms.  Objective: Vitals:   11/06/20 0730 11/06/20 0930 11/06/20 1029 11/06/20 1130  BP: 122/88 129/89 (!) 150/95 132/76  Pulse: (!) 55 96  87  Resp: 19 (!) 27  (!) 22  Temp:      TempSrc:      SpO2: 98% 99%  98%  Weight:      Height:        Intake/Output Summary (Last 24 hours) at 11/06/2020 1232 Last data filed at 11/06/2020 0502 Gross per 24 hour  Intake 346.69 ml  Output 1700 ml  Net -1353.31 ml   Filed Weights   11/05/20 2237  Weight: (!) 151.4 kg    Examination:  GENERAL: No apparent distress.  Nontoxic. HEENT: MMM.  Vision and hearing grossly intact.  NECK: Supple.  No apparent JVD.  RESP: 98% on 4 L.  Tachypnea.  Some work of breathing noted.  Rhonchorous bilaterally. CVS:  RRR. Heart sounds normal.  ABD/GI/GU: BS+. Abd soft, NTND.  MSK/EXT:  Moves  extremities. No apparent deformity.  BLE edema, lt>rt SKIN: no apparent skin lesion or wound NEURO: Awake, alert and oriented appropriately.  No apparent focal neuro deficit. PSYCH: Calm. Normal affect.   Procedures:  None  Microbiology summarized: COVID-19 and influenza PCR nonreactive. Blood cultures NGTD.  Assessment & Plan: Acute respiratory failure with hypoxia most likely due to COPD exacerbation: Saturating at 76% when EMS arrived.  Initially required NRB in route to ED. currently on 4 L.  Still tachypneic with some work of breathing, cough and rhonchorous lung. -Wean oxygen as able -Continue IV Solu-Medrol -Continue both CTX and azithromycin given CXR finding concerning for left perihilar infiltrate -Add LAMA/LABA/ICS with as needed DuoNeb. -Incentive spirometry, OOB, PT/OT -Ambulatory saturation assessment  Acute on chronic combined CHF-TTE in 12/21 with LVEF of 25 to 30%, G2DD and mod LAE.  Seems to have some element of CHF exacerbation with dyspnea and lower extremity edema.  Fluid status difficult to assess given morbid obesity.  However, he has BLE edema.  BNP higher than baseline.  Started on IV Lasix.  About 1.7 L UOP so far.  Renal function and BP stable -Continue IV Lasix 40 mg twice daily -GDMT-continue home low-dose Coreg and Aldactone.  Consider adding Farxiga. -Discontinue lisinopril.  Resume home Entresto 36 hours later -Monitor fluid status, renal functions and electrolytes -Follow echocardiogram -Sodium and fluid restriction  Possible  left perihilar pneumonia-CXR concerning for this.  He has no fever or leukocytosis.  Pro-Cal negative. -Reasonable to give him CAP coverage in the setting of COPD exacerbation  Mildly elevated troponin-likely demand ischemia from the above. History of CAD s/p stents in 01/2020. -Continue home Coreg, DAPT and statin -Follow TTE  History of CVA: Stable -Continue home meds  Essential hypertension: Normotensive. -Cardiac meds  as above  Tobacco use disorder: Reports smoking about half a pack a day. -Encourage cessation-considering. -Not interested in nicotine patch yet  Generalized weakness -PT/OT eval  Morbid obesity Body mass index is 44.04 kg/m.  -Encourage lifestyle change to lose weight.       DVT prophylaxis:    On Lovenox.  Code Status: Full code Family Communication: Patient and/or RN. Available if any question.  Level of care: Progressive Cardiac Status is: Inpatient  Remains inpatient appropriate because:IV treatments appropriate due to intensity of illness or inability to take PO and Inpatient level of care appropriate due to severity of illness  Dispo: The patient is from: Home              Anticipated d/c is to: Home              Patient currently is not medically stable to d/c.   Difficult to place patient No       Consultants:  None   Sch Meds:  Scheduled Meds:  aspirin EC  81 mg Oral Daily   atorvastatin  80 mg Oral QHS   carvedilol  3.125 mg Oral BID WC   clopidogrel  75 mg Oral q morning   enoxaparin (LOVENOX) injection  0.5 mg/kg Subcutaneous Q24H   fluticasone furoate-vilanterol  1 puff Inhalation Daily   folic acid  1 mg Oral Daily   furosemide  40 mg Intravenous Q12H   gabapentin  300 mg Oral TID   methylPREDNISolone (SOLU-MEDROL) injection  40 mg Intravenous Q12H   nicotine  21 mg Transdermal Daily   pantoprazole  40 mg Oral Daily   sodium chloride flush  3 mL Intravenous Q12H   umeclidinium bromide  1 puff Inhalation Daily   Continuous Infusions:  sodium chloride     azithromycin     cefTRIAXone (ROCEPHIN)  IV     PRN Meds:.sodium chloride, acetaminophen, ipratropium-albuterol, ondansetron (ZOFRAN) IV, sodium chloride flush  Antimicrobials: Anti-infectives (From admission, onward)    Start     Dose/Rate Route Frequency Ordered Stop   11/06/20 2300  cefTRIAXone (ROCEPHIN) 1 g in sodium chloride 0.9 % 100 mL IVPB  Status:  Discontinued        1  g 200 mL/hr over 30 Minutes Intravenous Every 24 hours 11/06/20 0054 11/06/20 1154   11/06/20 2300  azithromycin (ZITHROMAX) 500 mg in sodium chloride 0.9 % 250 mL IVPB        500 mg 250 mL/hr over 60 Minutes Intravenous Every 24 hours 11/06/20 1154 11/10/20 2259   11/06/20 2300  cefTRIAXone (ROCEPHIN) 2 g in sodium chloride 0.9 % 100 mL IVPB        2 g 200 mL/hr over 30 Minutes Intravenous Every 24 hours 11/06/20 1154 11/11/20 2259   11/05/20 2315  cefTRIAXone (ROCEPHIN) 1 g in sodium chloride 0.9 % 100 mL IVPB        1 g 200 mL/hr over 30 Minutes Intravenous  Once 11/05/20 2301 11/05/20 2354   11/05/20 2315  azithromycin (ZITHROMAX) 500 mg in sodium chloride 0.9 % 250 mL IVPB  500 mg 250 mL/hr over 60 Minutes Intravenous  Once 11/05/20 2301 11/06/20 0056        I have personally reviewed the following labs and images: CBC: Recent Labs  Lab 11/05/20 2242  WBC 10.0  NEUTROABS 8.4*  HGB 12.5*  HCT 37.0*  MCV 98.7  PLT 224   BMP &GFR Recent Labs  Lab 11/05/20 2242  NA 136  K 3.6  CL 93*  CO2 35*  GLUCOSE 133*  BUN 11  CREATININE 1.00  CALCIUM 9.0   Estimated Creatinine Clearance: 125.1 mL/min (by C-G formula based on SCr of 1 mg/dL). Liver & Pancreas: Recent Labs  Lab 11/05/20 2242  AST 23  ALT 17  ALKPHOS 80  BILITOT 1.2  PROT 7.4  ALBUMIN 3.6   No results for input(s): LIPASE, AMYLASE in the last 168 hours. No results for input(s): AMMONIA in the last 168 hours. Diabetic: No results for input(s): HGBA1C in the last 72 hours. No results for input(s): GLUCAP in the last 168 hours. Cardiac Enzymes: No results for input(s): CKTOTAL, CKMB, CKMBINDEX, TROPONINI in the last 168 hours. No results for input(s): PROBNP in the last 8760 hours. Coagulation Profile: No results for input(s): INR, PROTIME in the last 168 hours. Thyroid Function Tests: No results for input(s): TSH, T4TOTAL, FREET4, T3FREE, THYROIDAB in the last 72 hours. Lipid Profile: No  results for input(s): CHOL, HDL, LDLCALC, TRIG, CHOLHDL, LDLDIRECT in the last 72 hours. Anemia Panel: No results for input(s): VITAMINB12, FOLATE, FERRITIN, TIBC, IRON, RETICCTPCT in the last 72 hours. Urine analysis:    Component Value Date/Time   COLORURINE AMBER (A) 01/29/2020 1216   APPEARANCEUR CLOUDY (A) 01/29/2020 1216   LABSPEC 1.019 01/29/2020 1216   PHURINE 5.0 01/29/2020 1216   GLUCOSEU 150 (A) 01/29/2020 1216   HGBUR SMALL (A) 01/29/2020 1216   BILIRUBINUR NEGATIVE 01/29/2020 1216   KETONESUR NEGATIVE 01/29/2020 1216   PROTEINUR >=300 (A) 01/29/2020 1216   NITRITE NEGATIVE 01/29/2020 1216   LEUKOCYTESUR NEGATIVE 01/29/2020 1216   Sepsis Labs: Invalid input(s): PROCALCITONIN, LACTICIDVEN  Microbiology: Recent Results (from the past 240 hour(s))  Resp Panel by RT-PCR (Flu A&B, Covid) Nasopharyngeal Swab     Status: None   Collection Time: 11/05/20 10:42 PM   Specimen: Nasopharyngeal Swab; Nasopharyngeal(NP) swabs in vial transport medium  Result Value Ref Range Status   SARS Coronavirus 2 by RT PCR NEGATIVE NEGATIVE Final    Comment: (NOTE) SARS-CoV-2 target nucleic acids are NOT DETECTED.  The SARS-CoV-2 RNA is generally detectable in upper respiratory specimens during the acute phase of infection. The lowest concentration of SARS-CoV-2 viral copies this assay can detect is 138 copies/mL. A negative result does not preclude SARS-Cov-2 infection and should not be used as the sole basis for treatment or other patient management decisions. A negative result may occur with  improper specimen collection/handling, submission of specimen other than nasopharyngeal swab, presence of viral mutation(s) within the areas targeted by this assay, and inadequate number of viral copies(<138 copies/mL). A negative result must be combined with clinical observations, patient history, and epidemiological information. The expected result is Negative.  Fact Sheet for Patients:   BloggerCourse.com  Fact Sheet for Healthcare Providers:  SeriousBroker.it  This test is no t yet approved or cleared by the [place] FDA and  has been authorized for detection and/or diagnosis of SARS-CoV-2 by FDA under an Emergency Use Authorization (EUA). This EUA will remain  in effect (meaning this test can be used) for the duration  of the COVID-19 declaration under Section 564(b)(1) of the Act, 21 U.S.C.section 360bbb-3(b)(1), unless the authorization is terminated  or revoked sooner.       Influenza A by PCR NEGATIVE NEGATIVE Final   Influenza B by PCR NEGATIVE NEGATIVE Final    Comment: (NOTE) The Xpert Xpress SARS-CoV-2/FLU/RSV plus assay is intended as an aid in the diagnosis of influenza from Nasopharyngeal swab specimens and should not be used as a sole basis for treatment. Nasal washings and aspirates are unacceptable for Xpert Xpress SARS-CoV-2/FLU/RSV testing.  Fact Sheet for Patients: BloggerCourse.com  Fact Sheet for Healthcare Providers: SeriousBroker.it  This test is not yet approved or cleared by the [place] FDA and has been authorized for detection and/or diagnosis of SARS-CoV-2 by FDA under an Emergency Use Authorization (EUA). This EUA will remain in effect (meaning this test can be used) for the duration of the COVID-19 declaration under Section 564(b)(1) of the Act, 21 U.S.C. section 360bbb-3(b)(1), unless the authorization is terminated or revoked.  Performed at [facility], [address] [place], [place] [address]   Culture, blood (Routine X 2) w Reflex to ID Panel     Status: None (Preliminary result)   Collection Time: 11/05/20 10:42 PM   Specimen: BLOOD  Result Value Ref Range Status   Specimen Description BLOOD LEFT ASSIST CONTROL  Final   Special Requests   Final    BOTTLES DRAWN AEROBIC AND ANAEROBIC Blood Culture  adequate volume   Culture   Final    NO GROWTH < 12 HOURS Performed at [facility], [address], [place], [place] [address]    Report Status PENDING  Incomplete  Culture, blood (Routine X 2) w Reflex to ID Panel     Status: None (Preliminary result)   Collection Time: 11/05/20 11:18 PM   Specimen: BLOOD  Result Value Ref Range Status   Specimen Description BLOOD LEFT WRIST  Final   Special Requests   Final    BOTTLES DRAWN AEROBIC AND ANAEROBIC Blood Culture results may not be optimal due to an excessive volume of blood received in culture bottles   Culture   Final    NO GROWTH < 12 HOURS Performed at [facility], [address] [place], [place] [address]    Report Status PENDING  Incomplete    Radiology Studies: DG Chest Portable 1 View  Result Date: 11/05/2020 CLINICAL DATA:  Shortness of breath. Difficulty breathing. COPD and cold symptoms. EXAM: PORTABLE CHEST 1 VIEW COMPARISON:  01/31/2020 FINDINGS: Cardiac enlargement. Possible patchy perihilar infiltration on the left. This may indicate pneumonia or early edema. No pleural effusions. No pneumothorax. Tortuous aorta. Postoperative changes in the left shoulder. IMPRESSION: Cardiac enlargement. Suggestion of patchy perihilar infiltration on the left. Electronically Signed   By: Burman Nieves M.D.   On: 11/05/2020 22:56      Ginni Eichler T. Lileigh Fahringer [facility]  If 7PM-7AM, please contact night-coverage [email or web address] 11/06/2020, 12:32 PM

## 2020-11-06 NOTE — ED Notes (Addendum)
OT at bedside. 

## 2020-11-06 NOTE — Progress Notes (Signed)
Anticoagulation monitoring(Lovenox):  55 yo male ordered Lovenox 40 mg Q24h    Filed Weights   11/05/20 2237  Weight: (!) 151.4 kg (333 lb 12.4 oz)   BMI 44   Lab Results  Component Value Date   CREATININE 1.00 11/05/2020   CREATININE 0.79 02/03/2020   CREATININE 0.79 02/02/2020   Estimated Creatinine Clearance: 125.1 mL/min (by C-G formula based on SCr of 1 mg/dL). Hemoglobin & Hematocrit     Component Value Date/Time   HGB 12.5 (L) 11/05/2020 2242   HGB 14.4 04/04/2011 1047   HCT 37.0 (L) 11/05/2020 2242   HCT 42.3 04/04/2011 1047     Per Protocol for Patient with estCrcl  >30 ml/min and BMI > 30, will transition to Lovenox 75 mg Q24h.

## 2020-11-06 NOTE — ED Notes (Signed)
PT at bedside.

## 2020-11-06 NOTE — ED Notes (Addendum)
Upon assessment pt has increased work of breathing and wheezing. Pt O2 sats at 96% [place] 4 L. Pt given PRN duo neb at this time.

## 2020-11-06 NOTE — H&P (Signed)
History and Physical    Tommy Gaines DGL:[id number] DOB: September 17, 1965 DOA: 11/05/2020  PCP: Marta Antu, DO   Patient coming from: home  I have personally briefly reviewed patient's old medical records in [facility]  Chief Complaint: Respiratory distress  HPI: Tommy Gaines is a 55 y.o. male with medical history significant for  CAD with history of NSTEMI in 2021, complicated by acute systolic heart failure and cardiogenic shock with EF 25 to 30%, with multiple stents including the LAD, COPD with history of respiratory failure requiring mechanical intubation, not currently on home O2, class III obesity, HTN, CVA, who presents to the emergency room in respiratory distress with EMS recording O2 sat of 76% on room air, arriving on nonrebreather and receiving a nebulizer treatment in route.  Patient denies chest pain.  Was in his usual state of health until the day prior when he developed cough and shortness of breath.  He denied fever or chills.  ED course: On arrival patient was in respiratory distress, tachypneic to the 30s with O2 sat 93% on NRB.  Temp 99, pulse 103 with BP 143/88.  He was transitioned over to BiPAP Blood work normal WBC and procalcitonin less than 0.1 Troponin 26 with BNP 664 ABG on FiO2 35%: pH 7.45/PCO2 52/PO2 150 EtOH less than 10 COVID and flu negative  EKG, personally viewed and interpreted: Sinus bradycardia at 101 with no acute ST-T wave changes  Chest x-ray: Cardiac enlargement.  Suggestion of patchy perihilar infiltration on the left  Patient treated with Rocephin and azithromycin as well as duo nebs and methylprednisolone.  He continued to have increased work of breathing and remains on BiPAP due to tachypnea.  Hospitalist consulted for admission.  Review of Systems: As per HPI otherwise all other systems on review of systems negative.    Past Medical History:  Diagnosis Date   Arthritis    COPD (chronic obstructive pulmonary disease) (HCC)     Depression    Diverticulitis    Hyperlipidemia    Hypertension    Stroke (HCC) 2016   confirmed by CT scan     Past Surgical History:  Procedure Laterality Date   CORONARY STENT INTERVENTION N/A 01/30/2020   Procedure: CORONARY STENT INTERVENTION;  Surgeon: Elder Negus, MD;  Location: [facility];  Service: Cardiovascular;  Laterality: N/A;   CORONARY STENT INTERVENTION N/A 02/01/2020   Procedure: CORONARY STENT INTERVENTION;  Surgeon: Yates Decamp, MD;  Location: [facility]VAS[facility] CV [facility];  Service: Cardiovascular;  Laterality: N/A;   intestinal rupture  2012   colostomy with take down as a result of this surgery.  colostomy reversed 6 months post surgery.   JOINT REPLACEMENT Bilateral    shoulders   RIGHT/LEFT HEART CATH AND CORONARY ANGIOGRAPHY N/A 01/30/2020   Procedure: RIGHT/LEFT HEART CATH AND CORONARY ANGIOGRAPHY;  Surgeon: Elder Negus, MD;  Location: [facility] INV[facility] CV [facility];  Service: Cardiovascular;  Laterality: N/A;     reports that he has been smoking cigarettes. He has a 3.75 pack-year smoking history. He has quit using smokeless tobacco. He reports that he does not drink alcohol and does not use drugs.  Allergies  Allergen Reactions   Acetaminophen Nausea And Vomiting   Lidocaine Rash    Family History  Problem Relation Age of Onset   Cancer Mother    Diabetes Mother    Heart disease Father       Prior to Admission medications   Medication Sig Start Date End Date Taking?  Authorizing Provider  albuterol (PROVENTIL HFA;VENTOLIN HFA) 108 (90 Base) MCG/ACT inhaler Inhale 2 puffs into the lungs 3 (three) times daily. 09/18/15   [provider]  aspirin EC 81 MG tablet Take 81 mg by mouth.    [provider]  atorvastatin (LIPITOR) 80 MG tablet Take 1 tablet (80 mg total) by mouth daily. 02/04/20   Orpah Cobb, MD  budesonide-formoterol (SYMBICORT) 160-4.5 MCG/ACT inhaler Inhale 1 puff into the lungs 2 (two) times daily. 09/30/16  01/30/20  [provider]  carvedilol (COREG) 3.125 MG tablet Take 1 tablet (3.125 mg total) by mouth 2 (two) times daily with a meal. 02/03/20   Orpah Cobb, MD  docusate sodium (COLACE) 100 MG capsule Take 1 capsule (100 mg total) by mouth 2 (two) times daily. 02/03/20   Orpah Cobb, MD  FLUoxetine (PROZAC) 20 MG capsule Take by mouth. 02/08/20 02/07/21  [provider]  folic acid (FOLVITE) 1 MG tablet Take 1 tablet (1 mg total) by mouth daily. 02/04/20   Orpah Cobb, MD  furosemide (LASIX) 20 MG tablet Take 20 mg by mouth daily. 09/18/15   [provider]  gabapentin (NEURONTIN) 300 MG capsule Take by mouth. 02/08/20 02/07/21  [provider]  omeprazole (PRILOSEC) 20 MG capsule Take 20 mg by mouth daily as needed (Acid reflux). 09/18/15   [provider]  oxyCODONE (OXY IR/ROXICODONE) 5 MG immediate release tablet Take 1 tablet (5 mg total) by mouth every 6 (six) hours as needed for severe pain. 02/03/20   Orpah Cobb, MD  polyethylene glycol (MIRALAX / GLYCOLAX) 17 g packet Take 17 g by mouth daily. 02/04/20   Orpah Cobb, MD  potassium chloride (KLOR-CON) 10 MEQ tablet Take 1 tablet (10 mEq total) by mouth daily. 02/04/20   Orpah Cobb, MD  sacubitril-valsartan (ENTRESTO) 24-26 MG Take 1 tablet by mouth 2 (two) times daily. 02/03/20   Orpah Cobb, MD  spironolactone (ALDACTONE) 25 MG tablet Take 1 tablet (25 mg total) by mouth daily. 02/04/20   Orpah Cobb, MD  thiamine 100 MG tablet Take 1 tablet (100 mg total) by mouth daily. 02/04/20   Orpah Cobb, MD  ticagrelor (BRILINTA) 90 MG TABS tablet Take 1 tablet (90 mg total) by mouth 2 (two) times daily. 02/03/20   Orpah Cobb, MD  tiotropium (SPIRIVA) 18 MCG inhalation capsule Place 18 mcg into inhaler and inhale daily. 09/18/15   [provider]    Physical Exam: Vitals:   11/05/20 2300 11/05/20 2330 11/05/20 2352 11/06/20 0000  BP: (!) 145/79 125/84  132/81  Pulse: 98  91  92  Resp: (!) 33 (!) 27  (!) 24  Temp:   99 F (37.2 C)   TempSrc:   Rectal   SpO2: 95% 99%  100%  Weight:      Height:         Vitals:   11/05/20 2300 11/05/20 2330 11/05/20 2352 11/06/20 0000  BP: (!) 145/79 125/84  132/81  Pulse: 98 91  92  Resp: (!) 33 (!) 27  (!) 24  Temp:   99 F (37.2 C)   TempSrc:   Rectal   SpO2: 95% 99%  100%  Weight:      Height:          Constitutional: Alert and oriented x 3 .  Mild to moderate respiratory distress on BiPAP  HEENT:      Head: Normocephalic and atraumatic.         Eyes: PERLA, EOMI, Conjunctivae  are normal. Sclera is non-icteric.       Mouth/Throat: Mucous membranes are moist.       Neck: Supple with no signs of meningismus. Cardiovascular: Regular rate and rhythm. No murmurs, gallops, or rubs. 2+ symmetrical distal pulses are present . No JVD.  2 -3+LE edema Respiratory: Respiratory effort increased with bibasilar crackles few scattered wheezes Gastrointestinal: Soft, non tender, and non distended with positive bowel sounds.  Genitourinary: No CVA tenderness. Musculoskeletal: Nontender with normal range of motion in all extremities. No cyanosis, or erythema of extremities. Neurologic:  Face is symmetric. Moving all extremities. No gross focal neurologic deficits . Skin: Skin is warm, dry.  No rash or ulcers Psychiatric: Mood and affect are normal    Labs on Admission: I have personally reviewed following labs and imaging studies  CBC: Recent Labs  Lab 11/05/20 2242  WBC 10.0  NEUTROABS 8.4*  HGB 12.5*  HCT 37.0*  MCV 98.7  PLT 224   Basic Metabolic Panel: Recent Labs  Lab 11/05/20 2242  NA 136  K 3.6  CL 93*  CO2 35*  GLUCOSE 133*  BUN 11  CREATININE 1.00  CALCIUM 9.0   GFR: Estimated Creatinine Clearance: 125.1 mL/min (by C-G formula based on SCr of 1 mg/dL). Liver Function Tests: Recent Labs  Lab 11/05/20 2242  AST 23  ALT 17  ALKPHOS 80  BILITOT 1.2  PROT 7.4  ALBUMIN 3.6   No  results for input(s): LIPASE, AMYLASE in the last 168 hours. No results for input(s): AMMONIA in the last 168 hours. Coagulation Profile: No results for input(s): INR, PROTIME in the last 168 hours. Cardiac Enzymes: No results for input(s): CKTOTAL, CKMB, CKMBINDEX, TROPONINI in the last 168 hours. BNP (last 3 results) No results for input(s): PROBNP in the last 8760 hours. HbA1C: No results for input(s): HGBA1C in the last 72 hours. CBG: No results for input(s): GLUCAP in the last 168 hours. Lipid Profile: No results for input(s): CHOL, HDL, LDLCALC, TRIG, CHOLHDL, LDLDIRECT in the last 72 hours. Thyroid Function Tests: No results for input(s): TSH, T4TOTAL, FREET4, T3FREE, THYROIDAB in the last 72 hours. Anemia Panel: No results for input(s): VITAMINB12, FOLATE, FERRITIN, TIBC, IRON, RETICCTPCT in the last 72 hours. Urine analysis:    Component Value Date/Time   COLORURINE AMBER (A) 01/29/2020 1216   APPEARANCEUR CLOUDY (A) 01/29/2020 1216   LABSPEC 1.019 01/29/2020 1216   PHURINE 5.0 01/29/2020 1216   GLUCOSEU 150 (A) 01/29/2020 1216   HGBUR SMALL (A) 01/29/2020 1216   BILIRUBINUR NEGATIVE 01/29/2020 1216   KETONESUR NEGATIVE 01/29/2020 1216   PROTEINUR >=300 (A) 01/29/2020 1216   NITRITE NEGATIVE 01/29/2020 1216   LEUKOCYTESUR NEGATIVE 01/29/2020 1216    Radiological Exams on Admission: DG Chest Portable 1 View  Result Date: 11/05/2020 CLINICAL DATA:  Shortness of breath. Difficulty breathing. COPD and cold symptoms. EXAM: PORTABLE CHEST 1 VIEW COMPARISON:  01/31/2020 FINDINGS: Cardiac enlargement. Possible patchy perihilar infiltration on the left. This may indicate pneumonia or early edema. No pleural effusions. No pneumothorax. Tortuous aorta. Postoperative changes in the left shoulder. IMPRESSION: Cardiac enlargement. Suggestion of patchy perihilar infiltration on the left. Electronically Signed   By: Burman Nieves M.D.   On: 11/05/2020 22:56      Assessment/Plan 55 year old male with history of CAD with history of STEMI in 2021, complicated by acute systolic heart failure and cardiogenic shock with EF 25 to 30%, with multiple stents including the LAD, COPD with history of respiratory failure requiring mechanical intubation,  not currently on home O2, class III obesity, HTN, CVA, who presents to the emergency room in respiratory distress with EMS recording O2 sat of 76% on room air,    Acute respiratory failure with hypoxia and hypercapnia -Patient presents in acute respiratory distress, tachypneic requiring BiPAP, with O2 sats 76% on room air with EMS - ABG on FiO2 35% with pH 7.45, PCO2 of 52 - Etiology related to CHF and COPD exacerbation - Continue BiPAP and wean as tolerated - Treat acute etiologies as outlined below   COPD with acute exacerbation (HCC) Possible pneumonia -Patient with shortness of breath and cough, low-grade temp of 99 but with normal WBC and procalcitonin less than 0.1 - Chest x-ray with possible perihilar infiltrate - Patient received IV Rocephin and azithromycin in the ED - Given severe exacerbation and history of mechanical ventilation secondary to severe COPD exacerbation and pneumonia we will continue with Rocephin - Scheduled and as needed nebulized bronchodilator treatments - IV steroids    Acute on chronic systolic CHF  History of dilated cardiomyopathy -patient with respiratory distress, elevated BNP of 664, chest x-ray with possible perihilar edema - Last EF on record 25 to 30% in the setting of acute MI in 2021 - IV Lasix, - Daily weights with intake and output monitoring - Echocardiogram in the a.m. - Patient follows with cardiologist at [facility] and has been receiving outpatient IV Lasix  Elevated troponin CAD with history of stent angioplasty - Patient denies chest pain, EKG nonacute and troponin only mildly elevated suspect secondary to demand ischemia - Continue to trend -  Continue aspirin, statin, beta-blockers pending med verification    Obesity, Class III, BMI 40-49.9 (morbid obesity) (HCC) - Complicating factor to overall prognosis and care    Essential hypertension - Continue home meds pending med reconciliation    Nicotine dependence - Nicotine patch    DVT prophylaxis: Lovenox  Code Status: full code  Family Communication:  none  Disposition Plan: Back to previous home environment Consults called: none  Status:At the time of admission, it appears that the appropriate admission status for this patient is INPATIENT. This is judged to be reasonable and necessary in order to provide the required intensity of service to ensure the patient's safety given the presenting symptoms, physical exam findings, and initial radiographic and laboratory data in the context of their  Comorbid conditions.   Patient requires inpatient status due to high intensity of service, high risk for further deterioration and high frequency of surveillance required.   I certify that at the point of admission it is my clinical judgment that the patient will require inpatient hospital care spanning beyond 2 midnights     Andris Baumann MD [facility]     11/06/2020, 12:35 AM

## 2020-11-06 NOTE — ED Notes (Signed)
PT/OT at bedside at this time.

## 2020-11-06 NOTE — Evaluation (Signed)
Physical Therapy Evaluation Patient Details Name: Tommy Gaines MRN: [id number] DOB: [birth year] Today's Date: 11/06/2020  History of Present Illness  Tommy Gaines is a 55 y.o. male with medical history significant for  CAD with history of NSTEMI in 2021, complicated by acute systolic heart failure and cardiogenic shock with EF 25 to 30%, with multiple stents including the LAD, COPD with history of respiratory failure requiring mechanical intubation, not currently on home O2, class III obesity, HTN, CVA, who presents to the emergency room in respiratory distress  Clinical Impression  Pt seen for PT evaluation with pt agreeable to tx but declining ambulating to door & instead prefers to ambulate at EOB. Pt steps laterally & performs marching in place, standing ~2 minutes before requiring seated rest break. SpO2 does drop to 84% but pt able to recover with rest while on 4.5L/min. will continue to follow pt acutely to progress endurance/activity tolerance.       Recommendations for follow up therapy are one component of a multi-disciplinary discharge planning process, led by the attending physician.  Recommendations may be updated based on patient status, additional functional criteria and insurance authorization.  Follow Up Recommendations Supervision - Intermittent (cardiopulmonary rehab)    Equipment Recommendations  None recommended by PT    Recommendations for Other Services       Precautions / Restrictions Precautions Precautions: Fall Restrictions Weight Bearing Restrictions: No      Mobility  Bed Mobility Overal bed mobility: Independent             General bed mobility comments: supine<>sit with HOB slightly elevated    Transfers Overall transfer level: Independent Equipment used: None Transfers: Sit to/from Stand Sit to Stand: Independent         General transfer comment: SUP for O2 tank mgmt and poor tolerance  Ambulation/Gait Ambulation/Gait  assistance: Supervision Gait Distance (Feet): 10 Feet Assistive device: None Gait Pattern/deviations: WFL(Within Functional Limits) Gait velocity: decreased      Stairs            Wheelchair Mobility    Modified Rankin (Stroke Patients Only)       Balance Overall balance assessment: Mild deficits observed, not formally tested                                           Pertinent Vitals/Pain Pain Assessment: No/denies pain    Home Living Family/patient expects to be discharged to:: Private residence Living Arrangements: Spouse/significant other Available Help at Discharge: Family;Available PRN/intermittently (wife works, pt's mother can assist) Type of Home: House Home Access: Stairs to enter Entrance Stairs-Rails: None Entrance Stairs-Number of Steps: 1 Home Layout: One level Home Equipment: None      Prior Function Level of Independence: Independent         Comments: Independent community mobility and I/ADLs, driving, no falls in the past 6 months     Hand Dominance        Extremity/Trunk Assessment   Upper Extremity Assessment Upper Extremity Assessment: Overall WFL for tasks assessed    Lower Extremity Assessment Lower Extremity Assessment: Overall WFL for tasks assessed       Communication   Communication: No difficulties  Cognition Arousal/Alertness: Awake/alert Behavior During Therapy: WFL for tasks assessed/performed Overall Cognitive Status: Within Functional Limits for tasks assessed  General Comments General comments (skin integrity, edema, etc.): Pt on 4.5L/min via nasal cannula, lowest SpO2 of 84% but increases to >/= 90% with rest & cuing for pursed lip breathing    Exercises Other Exercises Other Exercises: Pt educated re: OT role, DME recs, d/c recs, falls prevention, ECS Other Exercises: LBD, toileting, sup<>sit, sit<>stand x2, sitting/standing  balance/tolerance, ~20 ft mobility   Assessment/Plan    PT Assessment Patient needs continued PT services  PT Problem List Decreased mobility;Decreased activity tolerance;Decreased balance       PT Treatment Interventions Therapeutic exercise;Gait training;Balance training;Stair training;Therapeutic activities;Patient/family education    PT Goals (Current goals can be found in the Care Plan section)  Acute Rehab PT Goals Patient Stated Goal: to breathe better PT Goal Formulation: With patient Time For Goal Achievement: 11/20/20 Potential to Achieve Goals: Good    Frequency Min 2X/week   Barriers to discharge        Co-evaluation               AM-PAC PT "6 Clicks" Mobility  Outcome Measure Help needed turning from your back to your side while in a flat bed without using bedrails?: None Help needed moving from lying on your back to sitting on the side of a flat bed without using bedrails?: None Help needed moving to and from a bed to a chair (including a wheelchair)?: A Little Help needed standing up from a chair using your arms (e.g., wheelchair or bedside chair)?: None Help needed to walk in hospital room?: A Little Help needed climbing 3-5 steps with a railing? : A Little 6 Click Score: 21    End of Session Equipment Utilized During Treatment: Oxygen Activity Tolerance: Patient tolerated treatment well Patient left: in bed;with call bell/phone within reach   PT Visit Diagnosis: Muscle weakness (generalized) (M62.81)    Time: 8563-1497 PT Time Calculation (min) (ACUTE ONLY): 10 min   Charges:   PT Evaluation $PT Eval Low Complexity: 1 Low          Aleda Grana, PT, DPT 11/06/20, 4:03 PM   Sandi Mariscal 11/06/2020, 4:02 PM

## 2020-11-06 NOTE — Evaluation (Signed)
Occupational Therapy Evaluation Patient Details Name: Tommy Gaines MRN: [id number] DOB: [birth year] Today's Date: 11/06/2020   History of Present Illness Tommy Gaines is a 55 y.o. male with medical history significant for  CAD with history of NSTEMI in 2021, complicated by acute systolic heart failure and cardiogenic shock with EF 25 to 30%, with multiple stents including the LAD, COPD with history of respiratory failure requiring mechanical intubation, not currently on home O2, class III obesity, HTN, CVA, who presents to the emergency room in respiratory distress   Clinical Impression   Tommy Gaines was seen for OT evaluation this date. Prior to hospital admission, pt was Independent for mobility and ADLs. Pt lives with girlfriend in home c 1 STE. Pt presents to acute OT demonstrating impaired ADL performance and functional mobility 2/2 decreased activity tolerance and functional balance deficits. Pt currently requires CGA + O2 tank for toilet t/f and urinating in standing, assist to manage O2 tank. MOD I don B shoes sitting EOB. SpO2 maintained 90s on 6L [place] t/o mobility, increased WOB noted.  Pt educated in energy conservation strategies including pursed lip breathing, activity pacing, prioritizing of meaningful occupations, and falls prevention. Pt would benefit from skilled OT to address functional application of ECS. Upon hospital discharge, anticipate no OT follow up needs.        Recommendations for follow up therapy are one component of a multi-disciplinary discharge planning process, led by the attending physician.  Recommendations may be updated based on patient status, additional functional criteria and insurance authorization.   Follow Up Recommendations  No OT follow up;Other (comment) (carfiopulmonary rehab)    Equipment Recommendations  None recommended by OT    Recommendations for Other Services       Precautions / Restrictions Precautions Precautions:  Fall Restrictions Weight Bearing Restrictions: No      Mobility Bed Mobility Overal bed mobility: Independent                  Transfers Overall transfer level: Needs assistance   Transfers: Sit to/from Stand Sit to Stand: Supervision         General transfer comment: SUP for O2 tank mgmt and poor tolerance    Balance Overall balance assessment: Mild deficits observed, not formally tested                                         ADL either performed or assessed with clinical judgement   ADL Overall ADL's : Needs assistance/impaired                                       General ADL Comments: CGA + O2 tank for toilet t/f and urinating in standing. MOD I don B shoes sitting EOB.      Pertinent Vitals/Pain Pain Assessment: No/denies pain     Hand Dominance     Extremity/Trunk Assessment Upper Extremity Assessment Upper Extremity Assessment: Overall WFL for tasks assessed   Lower Extremity Assessment Lower Extremity Assessment: Overall WFL for tasks assessed       Communication Communication Communication: No difficulties   Cognition Arousal/Alertness: Awake/alert Behavior During Therapy: WFL for tasks assessed/performed Overall Cognitive Status: Within Functional Limits for tasks assessed  General Comments  SpO2 90s t/o on 6L [place]    Exercises Exercises: Other exercises Other Exercises Other Exercises: Pt educated re: OT role, DME recs, d/c recs, falls prevention, ECS Other Exercises: LBD, toileting, sup<>sit, sit<>stand x2, sitting/standing balance/tolerance, ~20 ft mobility   Shoulder Instructions      Home Living Family/patient expects to be discharged to:: Private residence Living Arrangements: Spouse/significant other Available Help at Discharge: Family;Available PRN/intermittently Type of Home: House Home Access: Stairs to enter [employer] of  Steps: 1   Home Layout: One level     Bathroom Shower/Tub: Walk-in shower                    Prior Functioning/Environment Level of Independence: Independent        Comments: Independent community mobility and I/ADLs        OT Problem List: Decreased activity tolerance;Decreased safety awareness;Cardiopulmonary status limiting activity      OT Treatment/Interventions: Self-care/ADL training;Therapeutic exercise;Energy conservation;Therapeutic activities;Balance training;Patient/family education    OT Goals(Current goals can be found in the care plan section) Acute Rehab OT Goals Patient Stated Goal: to breathe better OT Goal Formulation: With patient Time For Goal Achievement: 11/20/20 Potential to Achieve Goals: Good ADL Goals Pt Will Transfer to Toilet: Independently;ambulating;regular height toilet Pt/caregiver will Perform Home Exercise Program: Increased strength;Independently Additional ADL Goal #1: Pt will Independently verbalize plan to implement x3 ECS  OT Frequency: Min 1X/week   Barriers to D/C:            Co-evaluation              AM-PAC OT "6 Clicks" Daily Activity     Outcome Measure Help from another person eating meals?: None Help from another person taking care of personal grooming?: A Little Help from another person toileting, which includes using toliet, bedpan, or urinal?: A Little Help from another person bathing (including washing, rinsing, drying)?: A Little Help from another person to put on and taking off regular upper body clothing?: None Help from another person to put on and taking off regular lower body clothing?: None 6 Click Score: 21   End of Session Equipment Utilized During Treatment: Oxygen  Activity Tolerance: Patient tolerated treatment well Patient left: in bed;with call bell/phone within reach  OT Visit Diagnosis: Other abnormalities of gait and mobility (R26.89)                Time: 7741-2878 OT Time  Calculation (min): 16 min Charges:  OT General Charges $OT Visit: 1 Visit OT Evaluation $OT Eval Low Complexity: 1 Low OT Treatments $Self Care/Home Management : 8-22 mins  Kathie Dike, M.S. OTR/L  11/06/20, 1:15 PM  ascom [phone]

## 2020-11-06 NOTE — ED Notes (Signed)
Dinner tray given at this time.  

## 2020-11-06 NOTE — Progress Notes (Signed)
Bipap on sb. Patient on 4 liters.

## 2020-11-06 NOTE — ED Notes (Signed)
Pt provided a urinal at the bedside.

## 2020-11-06 NOTE — ED Notes (Signed)
Lunch meal tray given.  

## 2020-11-06 NOTE — ED Notes (Signed)
Dr. Duncan @ the bedside. 

## 2020-11-07 ENCOUNTER — Encounter: Payer: Self-pay | Admitting: Internal Medicine

## 2020-11-07 ENCOUNTER — Inpatient Hospital Stay
Admit: 2020-11-07 | Discharge: 2020-11-07 | Disposition: A | Discharge status: Home / Self Care | Payer: Medicaid Other | Attending: Internal Medicine | Admitting: Internal Medicine

## 2020-11-07 DIAGNOSIS — I420 Dilated cardiomyopathy: Secondary | ICD-10-CM

## 2020-11-07 DIAGNOSIS — F17200 Nicotine dependence, unspecified, uncomplicated: Secondary | ICD-10-CM

## 2020-11-07 LAB — HEMOGLOBIN A1C
Hgb A1c MFr Bld: 5.4 % (ref 4.8–5.6)
Mean Plasma Glucose: 108.28 mg/dL

## 2020-11-07 LAB — CBC
HCT: 38 % — ABNORMAL LOW (ref 39.0–52.0)
Hemoglobin: 12.7 g/dL — ABNORMAL LOW (ref 13.0–17.0)
MCH: 34.2 pg — ABNORMAL HIGH (ref 26.0–34.0)
MCHC: 33.4 g/dL (ref 30.0–36.0)
MCV: 102.4 fL — ABNORMAL HIGH (ref 80.0–100.0)
Platelets: 293 10*3/uL (ref 150–400)
RBC: 3.71 MIL/uL — ABNORMAL LOW (ref 4.22–5.81)
RDW: 14.4 % (ref 11.5–15.5)
WBC: 13.6 10*3/uL — ABNORMAL HIGH (ref 4.0–10.5)
nRBC: 0 % (ref 0.0–0.2)

## 2020-11-07 LAB — ECHOCARDIOGRAM COMPLETE
AR max vel: 2.28 cm2
AV Area VTI: 2.67 cm2
AV Area mean vel: 2.29 cm2
AV Mean grad: 2 mmHg
AV Peak grad: 3.5 mmHg
Ao pk vel: 0.93 m/s
Area-P 1/2: 3.95 cm2
Height: 73 in
S' Lateral: 5.2 cm
Single Plane A4C EF: 42.7 %
Weight: 5340.42 oz

## 2020-11-07 LAB — RENAL FUNCTION PANEL
Albumin: 3.5 g/dL (ref 3.5–5.0)
Anion gap: 9 (ref 5–15)
BUN: 24 mg/dL — ABNORMAL HIGH (ref 6–20)
CO2: 37 mmol/L — ABNORMAL HIGH (ref 22–32)
Calcium: 8.8 mg/dL — ABNORMAL LOW (ref 8.9–10.3)
Chloride: 94 mmol/L — ABNORMAL LOW (ref 98–111)
Creatinine, Ser: 0.98 mg/dL (ref 0.61–1.24)
GFR, Estimated: >60 mL/min (ref >60)
Glucose, Bld: 136 mg/dL — ABNORMAL HIGH (ref 70–99)
Phosphorus: 3.5 mg/dL (ref 2.5–4.6)
Potassium: 4.3 mmol/L (ref 3.5–5.1)
Sodium: 140 mmol/L (ref 135–145)

## 2020-11-07 LAB — TSH: TSH: 0.598 u[IU]/mL (ref 0.350–4.500)

## 2020-11-07 LAB — MAGNESIUM: Magnesium: 2.2 mg/dL (ref 1.7–2.4)

## 2020-11-07 MED ORDER — PNEUMOCOCCAL VAC POLYVALENT 25 MCG/0.5ML IJ INJ: 0.5000 mL | INJECTION | INTRAMUSCULAR | Status: DC

## 2020-11-07 MED ORDER — TRAMADOL HCL 50 MG PO TABS
50.0000 mg | ORAL_TABLET | Freq: Three times a day (TID) | ORAL | Status: DC | PRN
  Filled 2020-11-07 (×2): qty 1

## 2020-11-07 MED ORDER — METHYLPREDNISOLONE SODIUM SUCC 125 MG IJ SOLR
80.0000 mg | Freq: Two times a day (BID) | INTRAMUSCULAR | Status: AC
  Administered 2020-11-07 – 2020-11-10 (×7): 80 mg via INTRAVENOUS
  Filled 2020-11-07 (×7): qty 2

## 2020-11-07 MED ORDER — INFLUENZA VAC SPLIT QUAD 0.5 ML IM SUSY
0.5000 mL | PREFILLED_SYRINGE | INTRAMUSCULAR | Status: DC
  Filled 2020-11-07: qty 0.5

## 2020-11-07 MED ORDER — DICLOFENAC SODIUM 1 % EX GEL
2.0000 g | Freq: Four times a day (QID) | CUTANEOUS | Status: DC
  Administered 2020-11-07 – 2020-11-10 (×11): 2 g via TOPICAL
  Filled 2020-11-07: qty 100

## 2020-11-07 NOTE — ED Notes (Signed)
O2 titrated to 2L [place]

## 2020-11-07 NOTE — ED Notes (Signed)
Pt repositioned in stretcher. Lunch trash thrown away

## 2020-11-07 NOTE — ED Notes (Signed)
Report messaged to crystal g rn floor nurse

## 2020-11-07 NOTE — ED Notes (Signed)
Pt 83% on RA while lying in bed. Placed back on 2L [place]. Will not ambulate d/t safety.

## 2020-11-07 NOTE — Progress Notes (Signed)
*  PRELIMINARY RESULTS* Echocardiogram 2D Echocardiogram has been performed.  Tommy Gaines 11/07/2020, 1:54 PM

## 2020-11-07 NOTE — Progress Notes (Signed)
PROGRESS NOTE  Tommy Gaines GYF:[id number] DOB: 01-Jan-1966   PCP: Marta Antu, DO  Patient is from: Home.  Independently ambulates at baseline.  Lives with his mother.  DOA: 11/05/2020 LOS: 1  Chief complaints:  Chief Complaint  Patient presents with   Respiratory Distress     Brief Narrative / Interim history: 55 year old M with PMH of CAD s/p stents in 01/2020, combined CHF, COPD, CVA, HTN, GERD, osteoarthritis, depression, morbid obesity and ongoing tobacco use presenting with shortness of breath and hypoxia to 76% on room air, and admitted for COPD exacerbation, CHF exacerbation and possible pneumonia.  He required nonrebreather en route to ED. ABG 7.4 5/52/150/36.  BNP elevated to 665.  Mild troponin elevation to 26>> 28.  Pro-Cal negative.  CXR with cardiomegaly and possible left perihilar infiltrate.  Started on IV diuretics, steroid with breathing treatments and antibiotics.    Subjective: Seen and examined earlier this morning.  No major events overnight of this morning.  Reports improvement in his breathing.  Still with cough.  He denies hemoptysis, chest pain, GI or UTI symptoms.  Not a great historian  Objective: Vitals:   11/07/20 1555 11/07/20 1600 11/07/20 1630 11/07/20 1734  BP:  138/77  140/77  Pulse: 65 74 91 97  Resp: (!) 24 (!) 23 (!) 28 19  Temp:    98 F (36.7 C)  TempSrc:    Oral  SpO2: (!) 89% 94% 92% 96%  Weight:      Height:        Intake/Output Summary (Last 24 hours) at 11/07/2020 1744 Last data filed at 11/07/2020 1429 Gross per 24 hour  Intake 350 ml  Output 2000 ml  Net -1650 ml   Filed Weights   11/05/20 2237  Weight: (!) 151.4 kg    Examination:  GENERAL: No apparent distress.  Nontoxic. HEENT: MMM.  Vision and hearing grossly intact.  NECK: Supple.  Difficult to assess JVD due to body habitus. RESP: 92% on 2 L by [place].  Diminished aeration.  Rhonchi and wheeze. CVS:  RRR. Heart sounds normal.  ABD/GI/GU: BS+. Abd soft, NTND.   MSK/EXT:  Moves extremities. No apparent deformity.  BLE edema, Lt> Rt SKIN: no apparent skin lesion or wound NEURO: Awake and alert. Oriented appropriately.  No apparent focal neuro deficit. PSYCH: Calm.  Somewhat flat affect  Procedures:  None  Microbiology summarized: COVID-19 and influenza PCR nonreactive. Blood cultures NGTD.  Assessment & Plan: Acute respiratory failure with hypoxia most likely due to COPD exacerbation: 76% when EMS arrived, and 84% with ambulation on RA.  Initially required NRB in route to ED. currently on 4 L.  Still tachypneic with some work of breathing, wheeze, cough and rhonchorous lung. -Wean oxygen as able -Increase IV Solu-Medrol to 80 mg twice daily -Continue both CTX and azithromycin given CXR finding concerning for left perihilar infiltrate -Continue Trelegy Ellipta with as needed DuoNeb -Incentive spirometry, OOB, PT/OT -Ambulatory saturation assessment  Acute on chronic combined CHF-TTE in 12/21 with LVEF of 25 to 30%, G2DD and mod LAE.  Seems to have some element of CHF exacerbation with dyspnea and lower extremity edema.  Fluid status difficult to assess given morbid obesity.  However, he has BLE edema.  BNP higher than baseline.  Started on IV Lasix.  I&O incomplete but about 700 cc UOP from overnight.  Renal function and BP stable -Continue IV Lasix 40 mg twice daily -GDMT-continue home low-dose Coreg and Aldactone.  Consider adding Farxiga. -Discontinued lisinopril.  Last dose the morning of 9/19.  Resume home [place] on 9/21 -Monitor fluid status, renal functions and electrolytes -Follow echocardiogram -Sodium and fluid restriction  Possible left perihilar pneumonia-CXR concerning for this.  No fever or leukocytosis.  Pro-Cal negative. -Reasonable to give him CAP coverage in the setting of COPD exacerbation  Mildly elevated troponin-likely demand ischemia from the above. History of CAD s/p stents in 01/2020. -Continue home Coreg, DAPT  and statin -Follow TTE  History of CVA: Stable -Continue home meds  Essential hypertension: Normotensive. -Cardiac meds as above  Tobacco use disorder: Reports smoking about half a pack a day. -Encourage cessation-considering. -Not interested in nicotine patch yet  Generalized weakness/physical deconditioning -PT/OT eval-recommended ambulatory referral to cardiopulmonary rehab  Morbid obesity Body mass index is 44.04 kg/m.  -Encourage lifestyle change to lose weight.       DVT prophylaxis:    On Lovenox.  Code Status: Full code Family Communication: Patient and/or RN. Available if any question.  Level of care: Progressive Cardiac Status is: Inpatient  Remains inpatient appropriate because:Ongoing diagnostic testing needed not appropriate for outpatient work up, IV treatments appropriate due to intensity of illness or inability to take PO, and Inpatient level of care appropriate due to severity of illness  Dispo: The patient is from: Home              Anticipated d/c is to: Home              Patient currently is not medically stable to d/c.   Difficult to place patient No       Consultants:  None   Sch Meds:  Scheduled Meds:  aspirin EC  81 mg Oral Daily   atorvastatin  80 mg Oral QHS   carvedilol  3.125 mg Oral BID WC   clopidogrel  75 mg Oral q morning   enoxaparin (LOVENOX) injection  0.5 mg/kg Subcutaneous Q24H   fluticasone furoate-vilanterol  1 puff Inhalation Daily   folic acid  1 mg Oral Daily   furosemide  40 mg Intravenous Q12H   gabapentin  300 mg Oral TID   methylPREDNISolone (SOLU-MEDROL) injection  80 mg Intravenous Q12H   nicotine  21 mg Transdermal Daily   pantoprazole  40 mg Oral Daily   sodium chloride flush  3 mL Intravenous Q12H   umeclidinium bromide  1 puff Inhalation Daily   Continuous Infusions:  sodium chloride     azithromycin Stopped (11/07/20 0335)   cefTRIAXone (ROCEPHIN)  IV Stopped (11/07/20 0008)   PRN Meds:.sodium  chloride, acetaminophen, ipratropium-albuterol, ondansetron (ZOFRAN) IV, sodium chloride flush  Antimicrobials: Anti-infectives (From admission, onward)    Start     Dose/Rate Route Frequency Ordered Stop   11/06/20 2300  cefTRIAXone (ROCEPHIN) 1 g in sodium chloride 0.9 % 100 mL IVPB  Status:  Discontinued        1 g 200 mL/hr over 30 Minutes Intravenous Every 24 hours 11/06/20 0054 11/06/20 1154   11/06/20 2300  azithromycin (ZITHROMAX) 500 mg in sodium chloride 0.9 % 250 mL IVPB        500 mg 250 mL/hr over 60 Minutes Intravenous Every 24 hours 11/06/20 1154 11/10/20 2259   11/06/20 2300  cefTRIAXone (ROCEPHIN) 2 g in sodium chloride 0.9 % 100 mL IVPB        2 g 200 mL/hr over 30 Minutes Intravenous Every 24 hours 11/06/20 1154 11/11/20 2259   11/05/20 2315  cefTRIAXone (ROCEPHIN) 1 g in sodium chloride 0.9 % 100  mL IVPB        1 g 200 mL/hr over 30 Minutes Intravenous  Once 11/05/20 2301 11/05/20 2354   11/05/20 2315  azithromycin (ZITHROMAX) 500 mg in sodium chloride 0.9 % 250 mL IVPB        500 mg 250 mL/hr over 60 Minutes Intravenous  Once 11/05/20 2301 11/06/20 0056        I have personally reviewed the following labs and images: CBC: Recent Labs  Lab 11/05/20 2242 11/07/20 0729  WBC 10.0 13.6*  NEUTROABS 8.4*  --   HGB 12.5* 12.7*  HCT 37.0* 38.0*  MCV 98.7 102.4*  PLT 224 293   BMP &GFR Recent Labs  Lab 11/05/20 2242 11/07/20 0729  NA 136 140  K 3.6 4.3  CL 93* 94*  CO2 35* 37*  GLUCOSE 133* 136*  BUN 11 24*  CREATININE 1.00 0.98  CALCIUM 9.0 8.8*  MG  --  2.2  PHOS  --  3.5   Estimated Creatinine Clearance: 127.6 mL/min (by C-G formula based on SCr of 0.98 mg/dL). Liver & Pancreas: Recent Labs  Lab 11/05/20 2242 11/07/20 0729  AST 23  --   ALT 17  --   ALKPHOS 80  --   BILITOT 1.2  --   PROT 7.4  --   ALBUMIN 3.6 3.5   No results for input(s): LIPASE, AMYLASE in the last 168 hours. No results for input(s): AMMONIA in the last 168  hours. Diabetic: Recent Labs    11/07/20 0729  HGBA1C 5.4   No results for input(s): GLUCAP in the last 168 hours. Cardiac Enzymes: No results for input(s): CKTOTAL, CKMB, CKMBINDEX, TROPONINI in the last 168 hours. No results for input(s): PROBNP in the last 8760 hours. Coagulation Profile: No results for input(s): INR, PROTIME in the last 168 hours. Thyroid Function Tests: Recent Labs    11/07/20 0729  TSH 0.598   Lipid Profile: No results for input(s): CHOL, HDL, LDLCALC, TRIG, CHOLHDL, LDLDIRECT in the last 72 hours. Anemia Panel: No results for input(s): VITAMINB12, FOLATE, FERRITIN, TIBC, IRON, RETICCTPCT in the last 72 hours. Urine analysis:    Component Value Date/Time   COLORURINE AMBER (A) 01/29/2020 1216   APPEARANCEUR CLOUDY (A) 01/29/2020 1216   LABSPEC 1.019 01/29/2020 1216   PHURINE 5.0 01/29/2020 1216   GLUCOSEU 150 (A) 01/29/2020 1216   HGBUR SMALL (A) 01/29/2020 1216   BILIRUBINUR NEGATIVE 01/29/2020 1216   KETONESUR NEGATIVE 01/29/2020 1216   PROTEINUR >=300 (A) 01/29/2020 1216   NITRITE NEGATIVE 01/29/2020 1216   LEUKOCYTESUR NEGATIVE 01/29/2020 1216   Sepsis Labs: Invalid input(s): PROCALCITONIN, LACTICIDVEN  Microbiology: Recent Results (from the past 240 hour(s))  Resp Panel by RT-PCR (Flu A&B, Covid) Nasopharyngeal Swab     Status: None   Collection Time: 11/05/20 10:42 PM   Specimen: Nasopharyngeal Swab; Nasopharyngeal(NP) swabs in vial transport medium  Result Value Ref Range Status   SARS Coronavirus 2 by RT PCR NEGATIVE NEGATIVE Final    Comment: (NOTE) SARS-CoV-2 target nucleic acids are NOT DETECTED.  The SARS-CoV-2 RNA is generally detectable in upper respiratory specimens during the acute phase of infection. The lowest concentration of SARS-CoV-2 viral copies this assay can detect is 138 copies/mL. A negative result does not preclude SARS-Cov-2 infection and should not be used as the sole basis for treatment or other patient  management decisions. A negative result may occur with  improper specimen collection/handling, submission of specimen other than nasopharyngeal swab, presence of viral mutation(s) within  the areas targeted by this assay, and inadequate number of viral copies(<138 copies/mL). A negative result must be combined with clinical observations, patient history, and epidemiological information. The expected result is Negative.  Fact Sheet for Patients:  BloggerCourse.com  Fact Sheet for Healthcare Providers:  SeriousBroker.it  This test is no t yet approved or cleared by the [place] FDA and  has been authorized for detection and/or diagnosis of SARS-CoV-2 by FDA under an Emergency Use Authorization (EUA). This EUA will remain  in effect (meaning this test can be used) for the duration of the COVID-19 declaration under Section 564(b)(1) of the Act, 21 U.S.C.section 360bbb-3(b)(1), unless the authorization is terminated  or revoked sooner.       Influenza A by PCR NEGATIVE NEGATIVE Final   Influenza B by PCR NEGATIVE NEGATIVE Final    Comment: (NOTE) The Xpert Xpress SARS-CoV-2/FLU/RSV plus assay is intended as an aid in the diagnosis of influenza from Nasopharyngeal swab specimens and should not be used as a sole basis for treatment. Nasal washings and aspirates are unacceptable for Xpert Xpress SARS-CoV-2/FLU/RSV testing.  Fact Sheet for Patients: BloggerCourse.com  Fact Sheet for Healthcare Providers: SeriousBroker.it  This test is not yet approved or cleared by the [place] FDA and has been authorized for detection and/or diagnosis of SARS-CoV-2 by FDA under an Emergency Use Authorization (EUA). This EUA will remain in effect (meaning this test can be used) for the duration of the COVID-19 declaration under Section 564(b)(1) of the Act, 21 U.S.C. section 360bbb-3(b)(1),  unless the authorization is terminated or revoked.  Performed at [facility], [address] [place], [place] [address]   Culture, blood (Routine X 2) w Reflex to ID Panel     Status: None (Preliminary result)   Collection Time: 11/05/20 10:42 PM   Specimen: BLOOD  Result Value Ref Range Status   Specimen Description BLOOD LEFT ASSIST CONTROL  Final   Special Requests   Final    BOTTLES DRAWN AEROBIC AND ANAEROBIC Blood Culture adequate volume   Culture   Final    NO GROWTH 2 DAYS Performed at [facility], [address] [place], [place] [address]    Report Status PENDING  Incomplete  Culture, blood (Routine X 2) w Reflex to ID Panel     Status: None (Preliminary result)   Collection Time: 11/05/20 11:18 PM   Specimen: BLOOD  Result Value Ref Range Status   Specimen Description BLOOD LEFT WRIST  Final   Special Requests   Final    BOTTLES DRAWN AEROBIC AND ANAEROBIC Blood Culture results may not be optimal due to an excessive volume of blood received in culture bottles   Culture   Final    NO GROWTH 2 DAYS Performed at [facility], [address], [place], [place] [address]    Report Status PENDING  Incomplete    Radiology Studies: No results found.    Alisabeth Selkirk T. Lilyannah Zuelke [facility]  If 7PM-7AM, please contact night-coverage [email or web address] 11/07/2020, 5:44 PM

## 2020-11-07 NOTE — ED Notes (Signed)
Pt eating lunch tray  

## 2020-11-07 NOTE — ED Notes (Signed)
Pt given cup of water 

## 2020-11-07 NOTE — ED Notes (Signed)
"  Check pulse oximetry while ambulating" orders marked as completed by error.  Will began weaning to RA.

## 2020-11-07 NOTE — ED Notes (Signed)
Lab at bedside to collect labs.  Pt resting at this time, 100% on 4L [place]. Stretcher locked in lowest position. NAD noted. RR even and unlabored

## 2020-11-07 NOTE — ED Notes (Signed)
Resumed care from bri rn.  Pt sleeping.  Oxygen in place via [place]. Pt waiting on bed assigment

## 2020-11-08 LAB — CBC
HCT: 39.5 % (ref 39.0–52.0)
Hemoglobin: 12.7 g/dL — ABNORMAL LOW (ref 13.0–17.0)
MCH: 33.2 pg (ref 26.0–34.0)
MCHC: 32.2 g/dL (ref 30.0–36.0)
MCV: 103.1 fL — ABNORMAL HIGH (ref 80.0–100.0)
Platelets: 341 10*3/uL (ref 150–400)
RBC: 3.83 MIL/uL — ABNORMAL LOW (ref 4.22–5.81)
RDW: 14.5 % (ref 11.5–15.5)
WBC: 15.5 10*3/uL — ABNORMAL HIGH (ref 4.0–10.5)
nRBC: 0 % (ref 0.0–0.2)

## 2020-11-08 LAB — RENAL FUNCTION PANEL
Albumin: 3.5 g/dL (ref 3.5–5.0)
Anion gap: 8 (ref 5–15)
BUN: 27 mg/dL — ABNORMAL HIGH (ref 6–20)
CO2: 37 mmol/L — ABNORMAL HIGH (ref 22–32)
Calcium: 8.7 mg/dL — ABNORMAL LOW (ref 8.9–10.3)
Chloride: 93 mmol/L — ABNORMAL LOW (ref 98–111)
Creatinine, Ser: 1.07 mg/dL (ref 0.61–1.24)
GFR, Estimated: >60 mL/min (ref >60)
Glucose, Bld: 190 mg/dL — ABNORMAL HIGH (ref 70–99)
Phosphorus: 1.8 mg/dL — ABNORMAL LOW (ref 2.5–4.6)
Potassium: 4.1 mmol/L (ref 3.5–5.1)
Sodium: 138 mmol/L (ref 135–145)

## 2020-11-08 LAB — MAGNESIUM: Magnesium: 2.4 mg/dL (ref 1.7–2.4)

## 2020-11-08 MED ORDER — IPRATROPIUM-ALBUTEROL 0.5-2.5 (3) MG/3ML IN SOLN
3.0000 mL | RESPIRATORY_TRACT | Status: DC
  Administered 2020-11-08 (×3): 3 mL via RESPIRATORY_TRACT
  Filled 2020-11-08 (×3): qty 3

## 2020-11-08 MED ORDER — IPRATROPIUM-ALBUTEROL 0.5-2.5 (3) MG/3ML IN SOLN: 3.0000 mL | RESPIRATORY_TRACT | Status: DC | PRN

## 2020-11-08 MED ORDER — POTASSIUM & SODIUM PHOSPHATES 280-160-250 MG PO PACK
1.0000 | PACK | Freq: Three times a day (TID) | ORAL | Status: AC
  Administered 2020-11-08 – 2020-11-09 (×4): 1 via ORAL
  Filled 2020-11-08 (×4): qty 1

## 2020-11-08 MED ORDER — ADULT MULTIVITAMIN W/MINERALS CH
1.0000 | ORAL_TABLET | Freq: Every day | ORAL | Status: DC
  Administered 2020-11-08 – 2020-11-10 (×3): 1 via ORAL
  Filled 2020-11-08 (×3): qty 1

## 2020-11-08 MED ORDER — IPRATROPIUM-ALBUTEROL 0.5-2.5 (3) MG/3ML IN SOLN
3.0000 mL | Freq: Four times a day (QID) | RESPIRATORY_TRACT | Status: DC
  Administered 2020-11-09 – 2020-11-10 (×5): 3 mL via RESPIRATORY_TRACT
  Filled 2020-11-08 (×2): qty 3
  Filled 2020-11-08: qty 30
  Filled 2020-11-08 (×2): qty 3

## 2020-11-08 MED ORDER — SACUBITRIL-VALSARTAN 24-26 MG PO TABS
1.0000 | ORAL_TABLET | Freq: Two times a day (BID) | ORAL | Status: DC
  Administered 2020-11-08 – 2020-11-10 (×5): 1 via ORAL
  Filled 2020-11-08 (×5): qty 1

## 2020-11-08 MED ORDER — IBUPROFEN 400 MG PO TABS
400.0000 mg | ORAL_TABLET | Freq: Four times a day (QID) | ORAL | Status: DC | PRN
  Administered 2020-11-08: 400 mg via ORAL
  Filled 2020-11-08: qty 1

## 2020-11-08 MED ORDER — AZITHROMYCIN 250 MG PO TABS
500.0000 mg | ORAL_TABLET | Freq: Every day | ORAL | Status: AC
  Administered 2020-11-08 – 2020-11-09 (×2): 500 mg via ORAL
  Filled 2020-11-08 (×2): qty 2

## 2020-11-08 NOTE — Progress Notes (Addendum)
PROGRESS NOTE    Tommy Gaines  EHO:[id number] DOB: 25-Apr-1965 DOA: 11/05/2020 PCP: Marta Antu, DO   Assessment & Plan:   Active Problems:   COPD with acute exacerbation (HCC)   Acute respiratory failure (HCC)   Acute on chronic systolic CHF (congestive heart failure) (HCC)   Acute respiratory failure with hypoxia and hypercapnia (HCC)   Dilated cardiomyopathy (HCC)   Obesity, Class III, BMI 40-49.9 (morbid obesity) (HCC)   Essential hypertension   Nicotine dependence  Acute respiratory hypoxic failure: likely secondary to COPD exacerbation. 76% when EMS arrived, and 84% with ambulation on RA. Continue on IV steroids, IV azithromycin, ceftriaxone. Continue on bronchodilators. Continue on supplemental oxygen and wean as tolerated   Possible pneumonia: continue on IV azithromycin, ceftriaxone, steroids & bronchodilators. Encourage incentive spirometry  Acute on chronic combined CHF: TTE in 12/21 with LVEF of 25 to 30%, grade II diastolic dysfunction and mod LAE.  Has dyspnea and lower extremity edema. Continue on IV lasix, coreg & entresto. Monitor I/Os and daily weights   Hypophosphatemia: phosp repleated. Will continue to monitor    Mildly elevated troponin: likely demand ischemia from the above. Hx of CAD s/p stents in 01/2020.  Hx of CVA: continue on statin, aspirin    HTN: continue on coreg, aldactone & entresto    Tobacco use disorder: smoking about half a pack a day. Received smoking cessation counseling    Generalized weakness: PT/OT recs cardiopulmonary rehab   Morbid obesity: BMI 41.6. Complicates overall care & prognosis     DVT prophylaxis: lovenox  Code Status: full  Family Communication:  Disposition Plan: likely d/c back home   Level of care: Progressive Cardiac  Status is: Inpatient  Remains inpatient appropriate because:IV treatments appropriate due to intensity of illness or inability to take PO and Inpatient level of care appropriate due to  severity of illness  Dispo: The patient is from: Home              Anticipated d/c is to: Home              Patient currently is not medically stable to d/c.   Difficult to place patient : unclear     Consultants:    Procedures:   Antimicrobials: azithromycin, rocephin    Subjective: Pt c/o shortness of breath   Objective: Vitals:   11/07/20 1734 11/07/20 1754 11/08/20 0030 11/08/20 0435  BP: 140/77  138/81 119/62  Pulse: 97  98 (!) 102  Resp: 19  20 18   Temp: 98 F (36.7 C)  98.5 F (36.9 C) 98.5 F (36.9 C)  TempSrc: Oral  Oral Oral  SpO2: 96%  96% 94%  Weight:  (!) 143.3 kg    Height:  6\' 1"  (1.854 m)      Intake/Output Summary (Last 24 hours) at 11/08/2020 0817 Last data filed at 11/08/2020 3704 Gross per 24 hour  Intake 100 ml  Output 3250 ml  Net -3150 ml   Filed Weights   11/05/20 2237 11/07/20 1754  Weight: (!) 151.4 kg (!) 143.3 kg    Examination:  General exam: Appears calm and comfortable  Respiratory system: diminished breath sounds b/l  Cardiovascular system: S1 & S2+. No rubs, gallops or clicks. Gastrointestinal system: Abdomen is nondistended, soft and nontender. Normal bowel sounds heard. Central nervous system: Alert and oriented. Moves all extremities  Psychiatry: Judgement and insight appear normal. Flat mood and affect     Data Reviewed: I have personally reviewed following labs and  imaging studies  CBC: Recent Labs  Lab 11/05/20 2242 11/07/20 0729 11/08/20 0356  WBC 10.0 13.6* 15.5*  NEUTROABS 8.4*  --   --   HGB 12.5* 12.7* 12.7*  HCT 37.0* 38.0* 39.5  MCV 98.7 102.4* 103.1*  PLT 224 293 341   Basic Metabolic Panel: Recent Labs  Lab 11/05/20 2242 11/07/20 0729 11/08/20 0356  NA 136 140 138  K 3.6 4.3 4.1  CL 93* 94* 93*  CO2 35* 37* 37*  GLUCOSE 133* 136* 190*  BUN 11 24* 27*  CREATININE 1.00 0.98 1.07  CALCIUM 9.0 8.8* 8.7*  MG  --  2.2 2.4  PHOS  --  3.5 1.8*   GFR: Estimated Creatinine Clearance: 113.4  mL/min (by C-G formula based on SCr of 1.07 mg/dL). Liver Function Tests: Recent Labs  Lab 11/05/20 2242 11/07/20 0729 11/08/20 0356  AST 23  --   --   ALT 17  --   --   ALKPHOS 80  --   --   BILITOT 1.2  --   --   PROT 7.4  --   --   ALBUMIN 3.6 3.5 3.5   No results for input(s): LIPASE, AMYLASE in the last 168 hours. No results for input(s): AMMONIA in the last 168 hours. Coagulation Profile: No results for input(s): INR, PROTIME in the last 168 hours. Cardiac Enzymes: No results for input(s): CKTOTAL, CKMB, CKMBINDEX, TROPONINI in the last 168 hours. BNP (last 3 results) No results for input(s): PROBNP in the last 8760 hours. HbA1C: Recent Labs    11/07/20 0729  HGBA1C 5.4   CBG: No results for input(s): GLUCAP in the last 168 hours. Lipid Profile: No results for input(s): CHOL, HDL, LDLCALC, TRIG, CHOLHDL, LDLDIRECT in the last 72 hours. Thyroid Function Tests: Recent Labs    11/07/20 0729  TSH 0.598   Anemia Panel: No results for input(s): VITAMINB12, FOLATE, FERRITIN, TIBC, IRON, RETICCTPCT in the last 72 hours. Sepsis Labs: Recent Labs  Lab 11/05/20 2242  PROCALCITON <0.10    Recent Results (from the past 240 hour(s))  Resp Panel by RT-PCR (Flu A&B, Covid) Nasopharyngeal Swab     Status: None   Collection Time: 11/05/20 10:42 PM   Specimen: Nasopharyngeal Swab; Nasopharyngeal(NP) swabs in vial transport medium  Result Value Ref Range Status   SARS Coronavirus 2 by RT PCR NEGATIVE NEGATIVE Final    Comment: (NOTE) SARS-CoV-2 target nucleic acids are NOT DETECTED.  The SARS-CoV-2 RNA is generally detectable in upper respiratory specimens during the acute phase of infection. The lowest concentration of SARS-CoV-2 viral copies this assay can detect is 138 copies/mL. A negative result does not preclude SARS-Cov-2 infection and should not be used as the sole basis for treatment or other patient management decisions. A negative result may occur with   improper specimen collection/handling, submission of specimen other than nasopharyngeal swab, presence of viral mutation(s) within the areas targeted by this assay, and inadequate number of viral copies(<138 copies/mL). A negative result must be combined with clinical observations, patient history, and epidemiological information. The expected result is Negative.  Fact Sheet for Patients:  BloggerCourse.com  Fact Sheet for Healthcare Providers:  SeriousBroker.it  This test is no t yet approved or cleared by the [place] FDA and  has been authorized for detection and/or diagnosis of SARS-CoV-2 by FDA under an Emergency Use Authorization (EUA). This EUA will remain  in effect (meaning this test can be used) for the duration of the COVID-19 declaration under  Section 564(b)(1) of the Act, 21 U.S.C.section 360bbb-3(b)(1), unless the authorization is terminated  or revoked sooner.       Influenza A by PCR NEGATIVE NEGATIVE Final   Influenza B by PCR NEGATIVE NEGATIVE Final    Comment: (NOTE) The Xpert Xpress SARS-CoV-2/FLU/RSV plus assay is intended as an aid in the diagnosis of influenza from Nasopharyngeal swab specimens and should not be used as a sole basis for treatment. Nasal washings and aspirates are unacceptable for Xpert Xpress SARS-CoV-2/FLU/RSV testing.  Fact Sheet for Patients: BloggerCourse.com  Fact Sheet for Healthcare Providers: SeriousBroker.it  This test is not yet approved or cleared by the [place] FDA and has been authorized for detection and/or diagnosis of SARS-CoV-2 by FDA under an Emergency Use Authorization (EUA). This EUA will remain in effect (meaning this test can be used) for the duration of the COVID-19 declaration under Section 564(b)(1) of the Act, 21 U.S.C. section 360bbb-3(b)(1), unless the authorization is terminated  or revoked.  Performed at [facility], [address] [place], [place] [address]   Culture, blood (Routine X 2) w Reflex to ID Panel     Status: None (Preliminary result)   Collection Time: 11/05/20 10:42 PM   Specimen: BLOOD  Result Value Ref Range Status   Specimen Description BLOOD LEFT ASSIST CONTROL  Final   Special Requests   Final    BOTTLES DRAWN AEROBIC AND ANAEROBIC Blood Culture adequate volume   Culture   Final    NO GROWTH 3 DAYS Performed at [facility], [address], [place], [place] [address]    Report Status PENDING  Incomplete  Culture, blood (Routine X 2) w Reflex to ID Panel     Status: None (Preliminary result)   Collection Time: 11/05/20 11:18 PM   Specimen: BLOOD  Result Value Ref Range Status   Specimen Description BLOOD LEFT WRIST  Final   Special Requests   Final    BOTTLES DRAWN AEROBIC AND ANAEROBIC Blood Culture results may not be optimal due to an excessive volume of blood received in culture bottles   Culture   Final    NO GROWTH 3 DAYS Performed at [facility], [address], [place], [place] [address]    Report Status PENDING  Incomplete         Radiology Studies: ECHOCARDIOGRAM COMPLETE  Result Date: 11/07/2020    ECHOCARDIOGRAM REPORT   Patient Name:   LELON IKARD Date of Exam: 11/07/2020 Medical Rec #:  [id number]       Height:       73.0 in Accession #:    [id number]      Weight:       333.8 lb Date of Birth:  1965-08-21       BSA:          2.675 m Patient Age:    55 years        BP:           117/69 mmHg Patient Gender: M               HR:           69 bpm. Exam Location:  [facility] Procedure: 2D Echo, Cardiac Doppler and Color Doppler Indications:     CHF- acute diastolic I50.31  History:         Patient has prior history of Echocardiogram examinations, most  recent 01/30/2020. COPD and Stroke; Risk Factors:Hypertension.  Sonographer:     Cristela Blue Referring Phys:  [id number] Andris Baumann  Diagnosing Phys: Alwyn Pea MD IMPRESSIONS  1. Left ventricular ejection fraction, by estimation, is 35 to 40%. The left ventricle has moderately decreased function. The left ventricle demonstrates regional wall motion abnormalities (see scoring diagram/findings for description). The left ventricular internal cavity size was mildly dilated. Left ventricular diastolic parameters were normal.  2. Right ventricular systolic function is low normal. The right ventricular size is mildly enlarged.  3. The mitral valve is normal in structure. Mild mitral valve regurgitation.  4. The aortic valve is normal in structure. Aortic valve regurgitation is not visualized. FINDINGS  Left Ventricle: Ant/Apical/Septal hypo. Left ventricular ejection fraction, by estimation, is 35 to 40%. The left ventricle has moderately decreased function. The left ventricle demonstrates regional wall motion abnormalities. The left ventricular internal cavity size was mildly dilated. There is borderline left ventricular hypertrophy. Left ventricular diastolic parameters were normal. Right Ventricle: The right ventricular size is mildly enlarged. No increase in right ventricular wall thickness. Right ventricular systolic function is low normal. Left Atrium: Left atrial size was normal in size. Right Atrium: Right atrial size was normal in size. Pericardium: There is no evidence of pericardial effusion. Mitral Valve: The mitral valve is normal in structure. Mild mitral valve regurgitation. Tricuspid Valve: The tricuspid valve is normal in structure. Tricuspid valve regurgitation is mild. Aortic Valve: The aortic valve is normal in structure. Aortic valve regurgitation is not visualized. Aortic valve mean gradient measures 2.0 mmHg. Aortic valve peak gradient measures 3.5 mmHg. Aortic valve area, by VTI measures 2.67 cm. Pulmonic Valve: The pulmonic valve was normal in structure. Pulmonic valve regurgitation is not visualized. Aorta: The  ascending aorta was not well visualized. IAS/Shunts: No atrial level shunt detected by color flow Doppler.  LEFT VENTRICLE PLAX 2D LVIDd:         6.40 cm      Diastology LVIDs:         5.20 cm      LV e' medial:    5.98 cm/s LV PW:         1.20 cm      LV E/e' medial:  14.1 LV IVS:        0.95 cm      LV e' lateral:   5.11 cm/s LVOT diam:     2.10 cm      LV E/e' lateral: 16.5 LV SV:         39 LV SV Index:   15 LVOT Area:     3.46 cm  LV Volumes (MOD) LV vol d, MOD A4C: 267.0 ml LV vol s, MOD A4C: 153.0 ml LV SV MOD A4C:     267.0 ml RIGHT VENTRICLE RV Basal diam:  3.90 cm RV S prime:     12.20 cm/s TAPSE (M-mode): 4.5 cm LEFT ATRIUM           Index       RIGHT ATRIUM           Index LA diam:      4.00 cm 1.50 cm/m  RA Area:     18.60 cm LA Vol (A4C): 45.9 ml 17.16 ml/m RA Volume:   48.20 ml  18.02 ml/m  AORTIC VALVE                   PULMONIC VALVE AV Area (Vmax):    2.28  cm    PV Vmax:        1.02 m/s AV Area (Vmean):   2.29 cm    PV Peak grad:   4.2 mmHg AV Area (VTI):     2.67 cm    RVOT Peak grad: 4 mmHg AV Vmax:           93.25 cm/s AV Vmean:          62.450 cm/s AV VTI:            0.148 m AV Peak Grad:      3.5 mmHg AV Mean Grad:      2.0 mmHg LVOT Vmax:         61.40 cm/s LVOT Vmean:        41.200 cm/s LVOT VTI:          0.114 m LVOT/AV VTI ratio: 0.77  AORTA Ao Root diam: 4.00 cm MITRAL VALVE               TRICUSPID VALVE MV Area (PHT): 3.95 cm    TR Peak grad:   15.5 mmHg MV Decel Time: 192 msec    TR Vmax:        197.00 cm/s MV E velocity: 84.40 cm/s MV A velocity: 75.80 cm/s  SHUNTS MV E/A ratio:  1.11        Systemic VTI:  0.11 m                            Systemic Diam: 2.10 cm Dwayne D Callwood MD Electronically signed by Alwyn Pea MD Signature Date/Time: 11/07/2020/9:27:26 PM    Final         Scheduled Meds:  aspirin EC  81 mg Oral Daily   atorvastatin  80 mg Oral QHS   azithromycin  500 mg Oral QHS   carvedilol  3.125 mg Oral BID WC   clopidogrel  75 mg Oral q morning    diclofenac Sodium  2 g Topical QID   enoxaparin (LOVENOX) injection  0.5 mg/kg Subcutaneous Q24H   fluticasone furoate-vilanterol  1 puff Inhalation Daily   folic acid  1 mg Oral Daily   furosemide  40 mg Intravenous Q12H   gabapentin  300 mg Oral TID   influenza vac split quadrivalent PF  0.5 mL Intramuscular Tomorrow-1000   methylPREDNISolone (SOLU-MEDROL) injection  80 mg Intravenous Q12H   nicotine  21 mg Transdermal Daily   pantoprazole  40 mg Oral Daily   pneumococcal 23 valent vaccine  0.5 mL Intramuscular Tomorrow-1000   sodium chloride flush  3 mL Intravenous Q12H   umeclidinium bromide  1 puff Inhalation Daily   Continuous Infusions:  sodium chloride     cefTRIAXone (ROCEPHIN)  IV Stopped (11/07/20 2339)     LOS: 2 days    Time spent: 32 mins     Charise Killian, MD [facility] Pager [phone]  If 7PM-7AM, please contact night-coverage  11/08/2020, 8:17 AM

## 2020-11-08 NOTE — Progress Notes (Signed)
Physical Therapy Treatment Patient Details Name: Tommy Gaines MRN: [id number] DOB: [birth year] Today's Date: 11/08/2020   History of Present Illness Tommy Gaines is a 55 y.o. male with medical history significant for  CAD with history of NSTEMI in 2021, complicated by acute systolic heart failure and cardiogenic shock with EF 25 to 30%, with multiple stents including the LAD, COPD with history of respiratory failure requiring mechanical intubation, not currently on home O2, class III obesity, HTN, CVA, who presents to the emergency room in respiratory distress    PT Comments    Patient received in bed, agrees to PT session. Reports continues to be sob, unable to cough anything up. He is mod independent with bed mobility, transfers with supervision and ambulated 3x in room without AD and supervision. Patient limited by sob and poor endurance. He will continue to benefit from skilled PT while here to improve mobility and endurance for safe return home.       Recommendations for follow up therapy are one component of a multi-disciplinary discharge planning process, led by the attending physician.  Recommendations may be updated based on patient status, additional functional criteria and insurance authorization.  Follow Up Recommendations  Supervision - Intermittent     Equipment Recommendations  None recommended by PT    Recommendations for Other Services       Precautions / Restrictions Precautions Precautions: Fall Restrictions Weight Bearing Restrictions: No     Mobility  Bed Mobility Overal bed mobility: Modified Independent                  Transfers Overall transfer level: Modified independent Equipment used: None   Sit to Stand: Modified independent (Device/Increase time)            Ambulation/Gait Ambulation/Gait assistance: Supervision Gait Distance (Feet): 50 Feet Assistive device: None Gait Pattern/deviations: Step-through pattern;Decreased step  length - right;Decreased step length - left Gait velocity: decreased   General Gait Details: patient is generally safe with ambulation, requires seated rest breaks due to fatigue and sob. ambulated 10', then 20 feet x 2. O2 sats remained > 90% throughout session.   Stairs             Wheelchair Mobility    Modified Rankin (Stroke Patients Only)       Balance Overall balance assessment: No apparent balance deficits (not formally assessed)                                          Cognition Arousal/Alertness: Awake/alert Behavior During Therapy: Flat affect Overall Cognitive Status: Within Functional Limits for tasks assessed                                        Exercises      General Comments        Pertinent Vitals/Pain Pain Assessment: No/denies pain    Home Living                      Prior Function            PT Goals (current goals can now be found in the care plan section) Acute Rehab PT Goals Patient Stated Goal: to breathe better, go home PT Goal Formulation: With patient Time For Goal Achievement: 11/20/20 Potential to  Achieve Goals: Good Progress towards PT goals: Progressing toward goals    Frequency    Min 2X/week      PT Plan Current plan remains appropriate    Co-evaluation              AM-PAC PT "6 Clicks" Mobility   Outcome Measure  Help needed turning from your back to your side while in a flat bed without using bedrails?: None Help needed moving from lying on your back to sitting on the side of a flat bed without using bedrails?: None Help needed moving to and from a bed to a chair (including a wheelchair)?: None Help needed standing up from a chair using your arms (e.g., wheelchair or bedside chair)?: None Help needed to walk in hospital room?: A Little Help needed climbing 3-5 steps with a railing? : A Little 6 Click Score: 22    End of Session Equipment Utilized During  Treatment: Oxygen Activity Tolerance: Patient limited by fatigue;Other (comment) (sob) Patient left: in chair;with call bell/phone within reach;with nursing/sitter in room Nurse Communication: Mobility status PT Visit Diagnosis: Muscle weakness (generalized) (M62.81);Difficulty in walking, not elsewhere classified (R26.2)     Time: 7425-9563 PT Time Calculation (min) (ACUTE ONLY): 16 min  Charges:  $Gait Training: 8-22 mins                     Chirstina Haan, PT, GCS 11/08/20,10:31 AM

## 2020-11-08 NOTE — Plan of Care (Signed)
  Problem: Education: Goal: Knowledge of General Education information will improve Description: Including pain rating scale, medication(s)/side effects and non-pharmacologic comfort measures 11/08/2020 1721 by Genevie Ann, RN Outcome: Progressing 11/08/2020 1720 by Genevie Ann, RN Outcome: Progressing

## 2020-11-08 NOTE — Progress Notes (Signed)
Initial Nutrition Assessment  DOCUMENTATION CODES:  Morbid obesity  INTERVENTION:  Add Magic cup BID with lunch and dinner, each supplement provides 290 kcal and 9 grams of protein.  Add MVI with minerals daily.  NUTRITION DIAGNOSIS:  Increased nutrient needs related to acute illness (COPD exacerbation, CHF exacerbation) as evidenced by estimated needs.  GOAL:  Patient will meet greater than or equal to 90% of their needs  MONITOR:  PO intake, Supplement acceptance, Labs, Weight trends, I & O's  REASON FOR ASSESSMENT:  Consult Assessment of nutrition requirement/status  ASSESSMENT:  55 yo male with a PMH of CAD s/p stents in 01/2020, combined CHF, COPD, CVA, HTN, GERD, osteoarthritis, depression, morbid obesity, and ongoing tobacco use presenting with shortness of breath and hypoxia to 76% on room air, and admitted for COPD exacerbation, CHF exacerbation and possible PNA.  Per Epic, pt ate 100% of breakfast this morning. Spoke with pt at bedside. Pt reports that he is finding items on the menu he likes and has been eating well at home. He denies appetite changes or changes in PO intake.  He denies any weight changes recently also. Per Epic, weight remains stable over the past 9 months. Of note, pt with generalized and mild BLE edema.  RD to order MVI with minerals and Magic Cup BID to for increased nutrient needs.  Medications: reviewed; folic acid, Lasix BID, Protonix  Labs: reviewed; Glucose 190 (H), BUN 24 -> 27 (H)   NUTRITION - FOCUSED PHYSICAL EXAM: Flowsheet Row Most Recent Value  Orbital Region No depletion  Upper Arm Region No depletion  Thoracic and Lumbar Region No depletion  Buccal Region No depletion  Temple Region No depletion  Clavicle Bone Region No depletion  Clavicle and Acromion Bone Region No depletion  Scapular Bone Region No depletion  Dorsal Hand No depletion  Patellar Region No depletion  Anterior Thigh Region No depletion  Posterior Calf  Region No depletion  Edema (RD Assessment) Mild  [Generalized, BLE]  Hair Reviewed  Eyes Reviewed  Mouth Reviewed  Skin Reviewed  Nails Reviewed   Diet Order:   Diet Order             Diet Heart Room service appropriate? Yes; Fluid consistency: Thin; Fluid restriction: 1500 mL Fluid  Diet effective now                  EDUCATION NEEDS:  Education needs have been addressed  Skin:  Skin Assessment: Reviewed RN Assessment  Last BM:  11/06/20  Height:  Ht Readings from Last 1 Encounters:  11/07/20 6\' 1"  (1.854 m)   Weight:  Wt Readings from Last 1 Encounters:  11/07/20 (!) 143.3 kg   BMI:  Body mass index is 41.68 kg/m.  Estimated Nutritional Needs:  Kcal:  2100-2300 Protein:  130-145 grams Fluid:  >2.1 L  11/09/20, RD, LDN (she/her/hers) Registered Dietitian I After-Hours/Weekend Pager # [phone] [place]

## 2020-11-08 NOTE — Progress Notes (Signed)
SATURATION QUALIFICATIONS: (This note is used to comply with regulatory documentation for home oxygen)  Patient Saturations on Room Air at Rest = 78%  Patient Saturations on Room Air while Ambulating = %  Patient Saturations on  Liters of oxygen while Ambulating = %  Please briefly explain why patient needs home oxygen: patient O2 sat on room air 78%, patient requring 4L to maintain sat in lower 90s  Alezander Dimaano, Kae Heller, RN

## 2020-11-08 NOTE — Consult Note (Signed)
   Heart Failure Nurse Navigator Note  HFrEF 25 to 30%.   He presented to the emergency room with respiratory distress, per EMS O2 saturations were 76% on room air.  He states that the day before he had noted a cough and increasing shortness of breath.   Comorbidities:  COPD Coronary artery disease Obesity CVA Hypertension Arthritis Hyperlipidemia Continued tobacco abuse  Labs:  Sodium 138, potassium 4.1, chloride 93, CO2 57, BUN 27, creatinine 1.07 Intake 100 mL Output 3250 mL Weight 143.3 kg, prior weight was 151.4  Medications:  Aspirin 81 mg daily Atorvastatin 80 mg daily Coreg 3.125 mg 2 times a day with meals Plavix 75 mg daily Furosemide 40 mg IV every 12 hours  Initial meeting with patient, he is currently lying in bed supine in no acute distress but continued on O2 per nasal cannula.  He states that he lives at home with his mother and girlfriend.  His his girlfriend and him are the ones that are at in charge of the meals.  He admits that they do eat out at restaurants quite frequently.  Discussed the eating in restaurant page, but pointed out things to avoid.  Also discussed the importance of daily weights, what to report to physician.  Also talked about fluid restriction, less than 64 ounces in a 24-hour period.  Also discussed his tobacco abuse, he states he smokes 1/2 pack at this time and may eventually down the road try and quit.  He states that he is an active during the day, discussed going for simple walks starting slow and as he feels better gradually increasing.  He voices understanding.   Also discussed following up in the outpatient heart failure clinic with Clarisa Kindred NP, he has an appointment on September 27 at 9:30 in the morning.  Tresa Endo RN [facility]FN

## 2020-11-08 NOTE — Progress Notes (Signed)
PHARMACIST - PHYSICIAN COMMUNICATION  CONCERNING: Antibiotic IV to Oral Route Change Policy  RECOMMENDATION: This patient is receiving azithromycin by the intravenous route.  Based on criteria approved by the Pharmacy and Therapeutics Committee, the antibiotic(s) is/are being converted to the equivalent oral dose form(s).   DESCRIPTION: These criteria include: Patient being treated for a respiratory tract infection, urinary tract infection, cellulitis or clostridium difficile associated diarrhea if on metronidazole The patient is not neutropenic and does not exhibit a GI malabsorption state The patient is eating (either orally or via tube) and/or has been taking other orally administered medications for a least 24 hours The patient is improving clinically and has a Tmax < 100.5  If you have questions about this conversion, please contact the Pharmacy Department   Tressie Ellis  11/08/20

## 2020-11-08 NOTE — Plan of Care (Signed)
  Problem: Education: Goal: Knowledge of General Education information will improve Description Including pain rating scale, medication(s)/side effects and non-pharmacologic comfort measures Outcome: Progressing   

## 2020-11-09 LAB — BASIC METABOLIC PANEL
Anion gap: 9 (ref 5–15)
BUN: 28 mg/dL — ABNORMAL HIGH (ref 6–20)
CO2: 40 mmol/L — ABNORMAL HIGH (ref 22–32)
Calcium: 8.6 mg/dL — ABNORMAL LOW (ref 8.9–10.3)
Chloride: 88 mmol/L — ABNORMAL LOW (ref 98–111)
Creatinine, Ser: 1.07 mg/dL (ref 0.61–1.24)
GFR, Estimated: >60 mL/min (ref >60)
Glucose, Bld: 228 mg/dL — ABNORMAL HIGH (ref 70–99)
Potassium: 3.6 mmol/L (ref 3.5–5.1)
Sodium: 137 mmol/L (ref 135–145)

## 2020-11-09 LAB — CBC
HCT: 39.3 % (ref 39.0–52.0)
Hemoglobin: 12.8 g/dL — ABNORMAL LOW (ref 13.0–17.0)
MCH: 33.5 pg (ref 26.0–34.0)
MCHC: 32.6 g/dL (ref 30.0–36.0)
MCV: 102.9 fL — ABNORMAL HIGH (ref 80.0–100.0)
Platelets: 347 10*3/uL (ref 150–400)
RBC: 3.82 MIL/uL — ABNORMAL LOW (ref 4.22–5.81)
RDW: 13.9 % (ref 11.5–15.5)
WBC: 15 10*3/uL — ABNORMAL HIGH (ref 4.0–10.5)
nRBC: 0 % (ref 0.0–0.2)

## 2020-11-09 LAB — PHOSPHORUS: Phosphorus: 2.2 mg/dL — ABNORMAL LOW (ref 2.5–4.6)

## 2020-11-09 MED ORDER — POTASSIUM & SODIUM PHOSPHATES 280-160-250 MG PO PACK
1.0000 | PACK | Freq: Three times a day (TID) | ORAL | Status: AC
  Administered 2020-11-09 – 2020-11-10 (×4): 1 via ORAL
  Filled 2020-11-09 (×5): qty 1

## 2020-11-09 MED ORDER — FUROSEMIDE 40 MG PO TABS
40.0000 mg | ORAL_TABLET | Freq: Every day | ORAL | Status: DC
  Administered 2020-11-10: 40 mg via ORAL
  Filled 2020-11-09: qty 1

## 2020-11-09 MED ORDER — MORPHINE SULFATE (PF) 2 MG/ML IV SOLN: 1.0000 mg | INTRAVENOUS | Status: DC | PRN

## 2020-11-09 MED ORDER — OXYCODONE-ACETAMINOPHEN 7.5-325 MG PO TABS
1.0000 | ORAL_TABLET | Freq: Four times a day (QID) | ORAL | Status: DC | PRN
  Administered 2020-11-09 – 2020-11-10 (×2): 1 via ORAL
  Filled 2020-11-09 (×2): qty 1

## 2020-11-09 NOTE — Progress Notes (Signed)
[facility] Cardiology    SUBJECTIVE: Planing of mild shortness of breath dyspnea improving also chronic pain denies any chest pain symptoms   Vitals:   11/09/20 0451 11/09/20 0518 11/09/20 0852 11/09/20 0900  BP: 113/63  105/69   Pulse: 95  74   Resp: 18  20   Temp: 99.3 F (37.4 C)  98.4 F (36.9 C)   TempSrc:      SpO2: 95%  97%   Weight:  (!) 146.8 kg  (!) 144.2 kg  Height:         Intake/Output Summary (Last 24 hours) at 11/09/2020 1056 Last data filed at 11/09/2020 0935 Gross per 24 hour  Intake 1572.11 ml  Output 3550 ml  Net -1977.89 ml      PHYSICAL EXAM  General: Well developed, well nourished, in no acute distress HEENT:  Normocephalic and atramatic Neck:  No JVD.  Lungs: Clear bilaterally to auscultation and percussion. Heart: HRRR . Normal S1 and S2 without gallops or murmurs.  Abdomen: Bowel sounds are positive, abdomen soft and non-tender  Msk:  Back normal, normal gait. Normal strength and tone for age. Extremities: No clubbing, cyanosis or edema.   Neuro: Alert and oriented X 3. Psych:  Good affect, responds appropriately   LABS: Basic Metabolic Panel: Recent Labs    11/07/20 0729 11/08/20 0356 11/09/20 0408  NA 140 138 137  K 4.3 4.1 3.6  CL 94* 93* 88*  CO2 37* 37* 40*  GLUCOSE 136* 190* 228*  BUN 24* 27* 28*  CREATININE 0.98 1.07 1.07  CALCIUM 8.8* 8.7* 8.6*  MG 2.2 2.4  --   PHOS 3.5 1.8* 2.2*   Liver Function Tests: Recent Labs    11/07/20 0729 11/08/20 0356  ALBUMIN 3.5 3.5   No results for input(s): LIPASE, AMYLASE in the last 72 hours. CBC: Recent Labs    11/08/20 0356 11/09/20 0408  WBC 15.5* 15.0*  HGB 12.7* 12.8*  HCT 39.5 39.3  MCV 103.1* 102.9*  PLT 341 347   Cardiac Enzymes: No results for input(s): CKTOTAL, CKMB, CKMBINDEX, TROPONINI in the last 72 hours. BNP: Invalid input(s): POCBNP D-Dimer: No results for input(s): DDIMER in the last 72 hours. Hemoglobin A1C: Recent Labs    11/07/20 0729  HGBA1C 5.4    Fasting Lipid Panel: No results for input(s): CHOL, HDL, LDLCALC, TRIG, CHOLHDL, LDLDIRECT in the last 72 hours. Thyroid Function Tests: Recent Labs    11/07/20 0729  TSH 0.598   Anemia Panel: No results for input(s): VITAMINB12, FOLATE, FERRITIN, TIBC, IRON, RETICCTPCT in the last 72 hours.  ECHOCARDIOGRAM COMPLETE  Result Date: 11/07/2020    ECHOCARDIOGRAM REPORT   Patient Name:   Tommy Gaines Date of Exam: 11/07/2020 Medical Rec #:  [id number]       Height:       73.0 in Accession #:    [id number]      Weight:       333.8 lb Date of Birth:  March 05, 1965       BSA:          2.675 m Patient Age:    55 years        BP:           117/69 mmHg Patient Gender: M               HR:           69 bpm. Exam Location:  [facility] Procedure: 2D Echo, Cardiac Doppler and Color Doppler Indications:  CHF- acute diastolic I50.31  History:         Patient has prior history of Echocardiogram examinations, most                  recent 01/30/2020. COPD and Stroke; Risk Factors:Hypertension.  Sonographer:     Cristela Blue Referring Phys:  [id number] Andris Baumann Diagnosing Phys: Alwyn Pea MD IMPRESSIONS  1. Left ventricular ejection fraction, by estimation, is 35 to 40%. The left ventricle has moderately decreased function. The left ventricle demonstrates regional wall motion abnormalities (see scoring diagram/findings for description). The left ventricular internal cavity size was mildly dilated. Left ventricular diastolic parameters were normal.  2. Right ventricular systolic function is low normal. The right ventricular size is mildly enlarged.  3. The mitral valve is normal in structure. Mild mitral valve regurgitation.  4. The aortic valve is normal in structure. Aortic valve regurgitation is not visualized. FINDINGS  Left Ventricle: Ant/Apical/Septal hypo. Left ventricular ejection fraction, by estimation, is 35 to 40%. The left ventricle has moderately decreased function. The left ventricle demonstrates  regional wall motion abnormalities. The left ventricular internal cavity size was mildly dilated. There is borderline left ventricular hypertrophy. Left ventricular diastolic parameters were normal. Right Ventricle: The right ventricular size is mildly enlarged. No increase in right ventricular wall thickness. Right ventricular systolic function is low normal. Left Atrium: Left atrial size was normal in size. Right Atrium: Right atrial size was normal in size. Pericardium: There is no evidence of pericardial effusion. Mitral Valve: The mitral valve is normal in structure. Mild mitral valve regurgitation. Tricuspid Valve: The tricuspid valve is normal in structure. Tricuspid valve regurgitation is mild. Aortic Valve: The aortic valve is normal in structure. Aortic valve regurgitation is not visualized. Aortic valve mean gradient measures 2.0 mmHg. Aortic valve peak gradient measures 3.5 mmHg. Aortic valve area, by VTI measures 2.67 cm. Pulmonic Valve: The pulmonic valve was normal in structure. Pulmonic valve regurgitation is not visualized. Aorta: The ascending aorta was not well visualized. IAS/Shunts: No atrial level shunt detected by color flow Doppler.  LEFT VENTRICLE PLAX 2D LVIDd:         6.40 cm      Diastology LVIDs:         5.20 cm      LV e' medial:    5.98 cm/s LV PW:         1.20 cm      LV E/e' medial:  14.1 LV IVS:        0.95 cm      LV e' lateral:   5.11 cm/s LVOT diam:     2.10 cm      LV E/e' lateral: 16.5 LV SV:         39 LV SV Index:   15 LVOT Area:     3.46 cm  LV Volumes (MOD) LV vol d, MOD A4C: 267.0 ml LV vol s, MOD A4C: 153.0 ml LV SV MOD A4C:     267.0 ml RIGHT VENTRICLE RV Basal diam:  3.90 cm RV S prime:     12.20 cm/s TAPSE (M-mode): 4.5 cm LEFT ATRIUM           Index       RIGHT ATRIUM           Index LA diam:      4.00 cm 1.50 cm/m  RA Area:     18.60 cm LA Vol (A4C): 45.9 ml 17.16 ml/m RA  Volume:   48.20 ml  18.02 ml/m  AORTIC VALVE                   PULMONIC VALVE AV Area  (Vmax):    2.28 cm    PV Vmax:        1.02 m/s AV Area (Vmean):   2.29 cm    PV Peak grad:   4.2 mmHg AV Area (VTI):     2.67 cm    RVOT Peak grad: 4 mmHg AV Vmax:           93.25 cm/s AV Vmean:          62.450 cm/s AV VTI:            0.148 m AV Peak Grad:      3.5 mmHg AV Mean Grad:      2.0 mmHg LVOT Vmax:         61.40 cm/s LVOT Vmean:        41.200 cm/s LVOT VTI:          0.114 m LVOT/AV VTI ratio: 0.77  AORTA Ao Root diam: 4.00 cm MITRAL VALVE               TRICUSPID VALVE MV Area (PHT): 3.95 cm    TR Peak grad:   15.5 mmHg MV Decel Time: 192 msec    TR Vmax:        197.00 cm/s MV E velocity: 84.40 cm/s MV A velocity: 75.80 cm/s  SHUNTS MV E/A ratio:  1.11        Systemic VTI:  0.11 m                            Systemic Diam: 2.10 cm Stony Stegmann D Kalani Baray MD Electronically signed by Alwyn Pea MD Signature Date/Time: 11/07/2020/9:27:26 PM    Final      Echo mild to moderately depressed left ventricular function EF 35 to 40%  TELEMETRY: Sinus rhythm rate of around 80 nonspecific findings:  ASSESSMENT AND PLAN:  Active Problems:   COPD with acute exacerbation (HCC)   Acute respiratory failure (HCC)   Acute on chronic systolic CHF (congestive heart failure) (HCC)   Acute respiratory failure with hypoxia and hypercapnia (HCC)   Dilated cardiomyopathy (HCC)   Obesity, Class III, BMI 40-49.9 (morbid obesity) (HCC)   Essential hypertension   Nicotine dependence   Plan Shortness of breath probably related to respiratory COPD issues continue inhalers respiratory support Noncompliance obstructive sleep apnea patient should wear CPAP Commend significant weight loss Advised the patient hypertension management control Recommend patient to refrain from tobacco abuse   Alwyn Pea, MD, 11/09/2020 10:56 AM

## 2020-11-09 NOTE — Progress Notes (Signed)
PROGRESS NOTE    Tommy Gaines  GYI:[id number] DOB: 11-25-1965 DOA: 11/05/2020 PCP: Marta Antu, DO   Assessment & Plan:   Active Problems:   COPD with acute exacerbation (HCC)   Acute respiratory failure (HCC)   Acute on chronic systolic CHF (congestive heart failure) (HCC)   Acute respiratory failure with hypoxia and hypercapnia (HCC)   Dilated cardiomyopathy (HCC)   Obesity, Class III, BMI 40-49.9 (morbid obesity) (HCC)   Essential hypertension   Nicotine dependence  Acute hypoxic respiratory failure: likely secondary to COPD exacerbation. 76% when EMS arrived, and 84% with ambulation on RA. Continue on IV steroids, IV azithromycin, ceftriaxone. Continue on bronchodilators. Continue on supplemental oxygen and wean as tolerated. Another oxygen desat test will be done today   Possible pneumonia: continue on IV rocephin, azithromycin, steroids & bronchodilators. Encourage incentive spirometry   Acute on chronic combined CHF: TTE in 12/21 with LVEF of 25 to 30%, grade II diastolic dysfunction and mod LAE.  Has dyspnea and lower extremity edema. Continue on IV lasix, coreg & entresto. Monitor I/Os and daily weights   Hypophosphatemia: phosp repleated again today. Will continue to monitor    Mildly elevated troponin: likely demand ischemia from the above. Hx of CAD s/p stents in 01/2020.  Hx of CVA: continue on aspirin, statin    HTN: continue on entresto, coreg    Tobacco use disorder: smoking about half a pack a day. Received smoking cessation counseling    Generalized weakness: PT/OT recs cardiopulmonary rehab   Morbid obesity: BMI 41.6. Complicates overall care & prognosis     DVT prophylaxis: lovenox  Code Status: full  Family Communication:  Disposition Plan: likely d/c back home   Level of care: Progressive Cardiac  Status is: Inpatient  Remains inpatient appropriate because:IV treatments appropriate due to intensity of illness or inability to take PO and  Inpatient level of care appropriate due to severity of illness  Dispo: The patient is from: Home              Anticipated d/c is to: Home              Patient currently is not medically stable to d/c.   Difficult to place patient : unclear     Consultants:    Procedures:   Antimicrobials: azithromycin, rocephin    Subjective: Pt c/o generalized pain   Objective: Vitals:   11/09/20 0016 11/09/20 0400 11/09/20 0451 11/09/20 0518  BP: 113/63 107/62 113/63   Pulse: 99 97 95   Resp: 15 20 18    Temp: 98.6 F (37 C) 97.8 F (36.6 C) 99.3 F (37.4 C)   TempSrc: Oral Oral    SpO2: 93% 93% 95%   Weight:    (!) 146.8 kg  Height:        Intake/Output Summary (Last 24 hours) at 11/09/2020 0803 Last data filed at 11/09/2020 0400 Gross per 24 hour  Intake 1572.11 ml  Output 3150 ml  Net -1577.89 ml   Filed Weights   11/05/20 2237 11/07/20 1754 11/09/20 0518  Weight: (!) 151.4 kg (!) 143.3 kg (!) 146.8 kg    Examination:  General exam: Appears uncomfortable  Respiratory system: decreased breath sounds b/l   Cardiovascular system: S1/S2+. No rubs or clicks  Gastrointestinal system: Abd is soft, NT, obese & hypoactive bowel sounds  Central nervous system: Alert and oriented. Moves all extremities  Psychiatry: Judgement and insight appear normal. Agitated and frustrated     Data Reviewed: I have  personally reviewed following labs and imaging studies  CBC: Recent Labs  Lab 11/05/20 2242 11/07/20 0729 11/08/20 0356 11/09/20 0408  WBC 10.0 13.6* 15.5* 15.0*  NEUTROABS 8.4*  --   --   --   HGB 12.5* 12.7* 12.7* 12.8*  HCT 37.0* 38.0* 39.5 39.3  MCV 98.7 102.4* 103.1* 102.9*  PLT 224 293 341 347   Basic Metabolic Panel: Recent Labs  Lab 11/05/20 2242 11/07/20 0729 11/08/20 0356 11/09/20 0408  NA 136 140 138 137  K 3.6 4.3 4.1 3.6  CL 93* 94* 93* 88*  CO2 35* 37* 37* 40*  GLUCOSE 133* 136* 190* 228*  BUN 11 24* 27* 28*  CREATININE 1.00 0.98 1.07 1.07   CALCIUM 9.0 8.8* 8.7* 8.6*  MG  --  2.2 2.4  --   PHOS  --  3.5 1.8* 2.2*   GFR: Estimated Creatinine Clearance: 115 mL/min (by C-G formula based on SCr of 1.07 mg/dL). Liver Function Tests: Recent Labs  Lab 11/05/20 2242 11/07/20 0729 11/08/20 0356  AST 23  --   --   ALT 17  --   --   ALKPHOS 80  --   --   BILITOT 1.2  --   --   PROT 7.4  --   --   ALBUMIN 3.6 3.5 3.5   No results for input(s): LIPASE, AMYLASE in the last 168 hours. No results for input(s): AMMONIA in the last 168 hours. Coagulation Profile: No results for input(s): INR, PROTIME in the last 168 hours. Cardiac Enzymes: No results for input(s): CKTOTAL, CKMB, CKMBINDEX, TROPONINI in the last 168 hours. BNP (last 3 results) No results for input(s): PROBNP in the last 8760 hours. HbA1C: Recent Labs    11/07/20 0729  HGBA1C 5.4   CBG: No results for input(s): GLUCAP in the last 168 hours. Lipid Profile: No results for input(s): CHOL, HDL, LDLCALC, TRIG, CHOLHDL, LDLDIRECT in the last 72 hours. Thyroid Function Tests: Recent Labs    11/07/20 0729  TSH 0.598   Anemia Panel: No results for input(s): VITAMINB12, FOLATE, FERRITIN, TIBC, IRON, RETICCTPCT in the last 72 hours. Sepsis Labs: Recent Labs  Lab 11/05/20 2242  PROCALCITON <0.10    Recent Results (from the past 240 hour(s))  Resp Panel by RT-PCR (Flu A&B, Covid) Nasopharyngeal Swab     Status: None   Collection Time: 11/05/20 10:42 PM   Specimen: Nasopharyngeal Swab; Nasopharyngeal(NP) swabs in vial transport medium  Result Value Ref Range Status   SARS Coronavirus 2 by RT PCR NEGATIVE NEGATIVE Final    Comment: (NOTE) SARS-CoV-2 target nucleic acids are NOT DETECTED.  The SARS-CoV-2 RNA is generally detectable in upper respiratory specimens during the acute phase of infection. The lowest concentration of SARS-CoV-2 viral copies this assay can detect is 138 copies/mL. A negative result does not preclude SARS-Cov-2 infection and should  not be used as the sole basis for treatment or other patient management decisions. A negative result may occur with  improper specimen collection/handling, submission of specimen other than nasopharyngeal swab, presence of viral mutation(s) within the areas targeted by this assay, and inadequate number of viral copies(<138 copies/mL). A negative result must be combined with clinical observations, patient history, and epidemiological information. The expected result is Negative.  Fact Sheet for Patients:  BloggerCourse.com  Fact Sheet for Healthcare Providers:  SeriousBroker.it  This test is no t yet approved or cleared by the [place] FDA and  has been authorized for detection and/or diagnosis of SARS-CoV-2  by FDA under an Emergency Use Authorization (EUA). This EUA will remain  in effect (meaning this test can be used) for the duration of the COVID-19 declaration under Section 564(b)(1) of the Act, 21 U.S.C.section 360bbb-3(b)(1), unless the authorization is terminated  or revoked sooner.       Influenza A by PCR NEGATIVE NEGATIVE Final   Influenza B by PCR NEGATIVE NEGATIVE Final    Comment: (NOTE) The Xpert Xpress SARS-CoV-2/FLU/RSV plus assay is intended as an aid in the diagnosis of influenza from Nasopharyngeal swab specimens and should not be used as a sole basis for treatment. Nasal washings and aspirates are unacceptable for Xpert Xpress SARS-CoV-2/FLU/RSV testing.  Fact Sheet for Patients: BloggerCourse.com  Fact Sheet for Healthcare Providers: SeriousBroker.it  This test is not yet approved or cleared by the [place] FDA and has been authorized for detection and/or diagnosis of SARS-CoV-2 by FDA under an Emergency Use Authorization (EUA). This EUA will remain in effect (meaning this test can be used) for the duration of the COVID-19 declaration under  Section 564(b)(1) of the Act, 21 U.S.C. section 360bbb-3(b)(1), unless the authorization is terminated or revoked.  Performed at [facility], [address] [place], [place] [address]   Culture, blood (Routine X 2) w Reflex to ID Panel     Status: None (Preliminary result)   Collection Time: 11/05/20 10:42 PM   Specimen: BLOOD  Result Value Ref Range Status   Specimen Description BLOOD LEFT ASSIST CONTROL  Final   Special Requests   Final    BOTTLES DRAWN AEROBIC AND ANAEROBIC Blood Culture adequate volume   Culture   Final    NO GROWTH 4 DAYS Performed at [facility], [address], [place], [place] [address]    Report Status PENDING  Incomplete  Culture, blood (Routine X 2) w Reflex to ID Panel     Status: None (Preliminary result)   Collection Time: 11/05/20 11:18 PM   Specimen: BLOOD  Result Value Ref Range Status   Specimen Description BLOOD LEFT WRIST  Final   Special Requests   Final    BOTTLES DRAWN AEROBIC AND ANAEROBIC Blood Culture results may not be optimal due to an excessive volume of blood received in culture bottles   Culture   Final    NO GROWTH 4 DAYS Performed at [facility], [address], [place], [place] [address]    Report Status PENDING  Incomplete         Radiology Studies: ECHOCARDIOGRAM COMPLETE  Result Date: 11/07/2020    ECHOCARDIOGRAM REPORT   Patient Name:   JACQUEES GONGORA Date of Exam: 11/07/2020 Medical Rec #:  [id number]       Height:       73.0 in Accession #:    [id number]      Weight:       333.8 lb Date of Birth:  [birth year]       BSA:          2.675 m Patient Age:    55 years        BP:           117/69 mmHg Patient Gender: M               HR:           69 bpm. Exam Location:  [facility] Procedure: 2D Echo, Cardiac Doppler and Color Doppler Indications:     CHF- acute diastolic I50.31  History:  Patient has prior history of Echocardiogram examinations, most                  recent 01/30/2020. COPD and  Stroke; Risk Factors:Hypertension.  Sonographer:     Cristela Blue Referring Phys:  [id number] Andris Baumann Diagnosing Phys: Alwyn Pea MD IMPRESSIONS  1. Left ventricular ejection fraction, by estimation, is 35 to 40%. The left ventricle has moderately decreased function. The left ventricle demonstrates regional wall motion abnormalities (see scoring diagram/findings for description). The left ventricular internal cavity size was mildly dilated. Left ventricular diastolic parameters were normal.  2. Right ventricular systolic function is low normal. The right ventricular size is mildly enlarged.  3. The mitral valve is normal in structure. Mild mitral valve regurgitation.  4. The aortic valve is normal in structure. Aortic valve regurgitation is not visualized. FINDINGS  Left Ventricle: Ant/Apical/Septal hypo. Left ventricular ejection fraction, by estimation, is 35 to 40%. The left ventricle has moderately decreased function. The left ventricle demonstrates regional wall motion abnormalities. The left ventricular internal cavity size was mildly dilated. There is borderline left ventricular hypertrophy. Left ventricular diastolic parameters were normal. Right Ventricle: The right ventricular size is mildly enlarged. No increase in right ventricular wall thickness. Right ventricular systolic function is low normal. Left Atrium: Left atrial size was normal in size. Right Atrium: Right atrial size was normal in size. Pericardium: There is no evidence of pericardial effusion. Mitral Valve: The mitral valve is normal in structure. Mild mitral valve regurgitation. Tricuspid Valve: The tricuspid valve is normal in structure. Tricuspid valve regurgitation is mild. Aortic Valve: The aortic valve is normal in structure. Aortic valve regurgitation is not visualized. Aortic valve mean gradient measures 2.0 mmHg. Aortic valve peak gradient measures 3.5 mmHg. Aortic valve area, by VTI measures 2.67 cm. Pulmonic Valve: The  pulmonic valve was normal in structure. Pulmonic valve regurgitation is not visualized. Aorta: The ascending aorta was not well visualized. IAS/Shunts: No atrial level shunt detected by color flow Doppler.  LEFT VENTRICLE PLAX 2D LVIDd:         6.40 cm      Diastology LVIDs:         5.20 cm      LV e' medial:    5.98 cm/s LV PW:         1.20 cm      LV E/e' medial:  14.1 LV IVS:        0.95 cm      LV e' lateral:   5.11 cm/s LVOT diam:     2.10 cm      LV E/e' lateral: 16.5 LV SV:         39 LV SV Index:   15 LVOT Area:     3.46 cm  LV Volumes (MOD) LV vol d, MOD A4C: 267.0 ml LV vol s, MOD A4C: 153.0 ml LV SV MOD A4C:     267.0 ml RIGHT VENTRICLE RV Basal diam:  3.90 cm RV S prime:     12.20 cm/s TAPSE (M-mode): 4.5 cm LEFT ATRIUM           Index       RIGHT ATRIUM           Index LA diam:      4.00 cm 1.50 cm/m  RA Area:     18.60 cm LA Vol (A4C): 45.9 ml 17.16 ml/m RA Volume:   48.20 ml  18.02 ml/m  AORTIC VALVE  PULMONIC VALVE AV Area (Vmax):    2.28 cm    PV Vmax:        1.02 m/s AV Area (Vmean):   2.29 cm    PV Peak grad:   4.2 mmHg AV Area (VTI):     2.67 cm    RVOT Peak grad: 4 mmHg AV Vmax:           93.25 cm/s AV Vmean:          62.450 cm/s AV VTI:            0.148 m AV Peak Grad:      3.5 mmHg AV Mean Grad:      2.0 mmHg LVOT Vmax:         61.40 cm/s LVOT Vmean:        41.200 cm/s LVOT VTI:          0.114 m LVOT/AV VTI ratio: 0.77  AORTA Ao Root diam: 4.00 cm MITRAL VALVE               TRICUSPID VALVE MV Area (PHT): 3.95 cm    TR Peak grad:   15.5 mmHg MV Decel Time: 192 msec    TR Vmax:        197.00 cm/s MV E velocity: 84.40 cm/s MV A velocity: 75.80 cm/s  SHUNTS MV E/A ratio:  1.11        Systemic VTI:  0.11 m                            Systemic Diam: 2.10 cm Dwayne D Callwood MD Electronically signed by Alwyn Pea MD Signature Date/Time: 11/07/2020/9:27:26 PM    Final         Scheduled Meds:  aspirin EC  81 mg Oral Daily   atorvastatin  80 mg Oral QHS    azithromycin  500 mg Oral QHS   carvedilol  3.125 mg Oral BID WC   clopidogrel  75 mg Oral q morning   diclofenac Sodium  2 g Topical QID   enoxaparin (LOVENOX) injection  0.5 mg/kg Subcutaneous Q24H   fluticasone furoate-vilanterol  1 puff Inhalation Daily   folic acid  1 mg Oral Daily   furosemide  40 mg Intravenous Q12H   gabapentin  300 mg Oral TID   influenza vac split quadrivalent PF  0.5 mL Intramuscular Tomorrow-1000   ipratropium-albuterol  3 mL Nebulization QID   methylPREDNISolone (SOLU-MEDROL) injection  80 mg Intravenous Q12H   multivitamin with minerals  1 tablet Oral Daily   nicotine  21 mg Transdermal Daily   pantoprazole  40 mg Oral Daily   pneumococcal 23 valent vaccine  0.5 mL Intramuscular Tomorrow-1000   potassium & sodium phosphates  1 packet Oral TID WC & HS   sacubitril-valsartan  1 tablet Oral BID   sodium chloride flush  3 mL Intravenous Q12H   umeclidinium bromide  1 puff Inhalation Daily   Continuous Infusions:  sodium chloride     cefTRIAXone (ROCEPHIN)  IV 2 g (11/08/20 2222)     LOS: 3 days    Time spent: 34 mins     Charise Killian, MD [facility] Pager [phone]  If 7PM-7AM, please contact night-coverage  11/09/2020, 8:03 AM

## 2020-11-09 NOTE — Progress Notes (Signed)
Mobility Specialist - Progress Note   11/09/20 1400  Mobility  Activity Ambulated in room  Level of Assistance Standby assist, set-up cues, supervision of patient - no hands on  Assistive Device None  Distance Ambulated (ft) 120 ft  Mobility Ambulated with assistance in room  Mobility Response Tolerated well  Mobility performed by Mobility specialist  $Mobility charge 1 Mobility    O2 while resting on RA = 86% O2 while AMB on 4L = 92%   Pt resting in bed on arrival, utilizing 2L. ModI to exit bed. Ambulated 2 x 60' with supervision. O2 desat to 87% while AMB on 3L, bumped up to 4L for remainder of session. Wheezing noted with activity. RPE 4/10.   Tommy Gaines Mobility Specialist 11/09/20, 3:37 PM

## 2020-11-09 NOTE — Plan of Care (Signed)
Patient slept well, maintained stable VS, family member brought food from home, encouraged the patient to increase intake  heart health diet.  No BM. I/O monitored closely  Problem: Clinical Measurements: Goal: Will remain free from infection Outcome: Progressing   Problem: Clinical Measurements: Goal: Ability to maintain clinical measurements within normal limits will improve Outcome: Progressing   Problem: Health Behavior/Discharge Planning: Goal: Ability to manage health-related needs will improve Outcome: Progressing   Problem: Clinical Measurements: Goal: Respiratory complications will improve Outcome: Progressing   Problem: Clinical Measurements: Goal: Cardiovascular complication will be avoided Outcome: Progressing

## 2020-11-09 NOTE — Plan of Care (Signed)
Nutrition Education Note  RD consulted for nutrition education regarding poorly controlled CHF.  RD provided "Low Sodium Nutrition Therapy" handout.. Provided examples on ways to decrease sodium intake in diet. Discouraged intake of processed foods and use of salt shaker. Encouraged fresh fruits and vegetables as well as whole grain sources of carbohydrates to maximize fiber intake.   RD discussed why it is important for patient to adhere to diet recommendations, and emphasized the role of fluids, foods to avoid, and importance of weighing self daily. Teach back method used.  Patient spent most of education watching TV and not engaging with RD. Patient not really interested in education at this time.  Expect poor compliance.   Body mass index is 41.93 kg/m. Pt meets criteria for Obesity Class 3 based on current BMI.  Current diet order is Heart Healthy, patient is consuming approximately 100% of meals at this time. Labs and medications reviewed.   Unit RD will continue to follow as appropriate. If additional nutrition issues arise, please re-consult RD.   Vertell Limber, RD, LDN (she/her/hers) Registered Dietitian I After-Hours/Weekend Pager # in [place]

## 2020-11-09 NOTE — Progress Notes (Signed)
Physical Therapy Treatment Patient Details Name: Tommy Gaines MRN: [id number] DOB: 12-23-1965 Today's Date: 11/09/2020   History of Present Illness Tommy Gaines is a 55 y.o. male with medical history significant for  CAD with history of NSTEMI in 2021, complicated by acute systolic heart failure and cardiogenic shock with EF 25 to 30%, with multiple stents including the LAD, COPD with history of respiratory failure requiring mechanical intubation, not currently on home O2, class III obesity, HTN, CVA, who presents to the emergency room in respiratory distress    PT Comments    Patient received in bed, agrees to PT. Reports he just walked in room. He reports he is ready to go home. Patient is mod independent with bed mobility and transfers. Ambulated 150 feet with supervision. No AD. Ambulated with 6 lpm O2 with saturations at 98% after ambulation. Reports 5/10 on dyspnea scale. He may benefit from HHPT/RN if home O2 needed at discharge for activity pacing and monitoring of O2 needs/use. Patient will continue to benefit from skilled PT while here to improve activity tolerance.        Recommendations for follow up therapy are one component of a multi-disciplinary discharge planning process, led by the attending physician.  Recommendations may be updated based on patient status, additional functional criteria and insurance authorization.  Follow Up Recommendations  Home health PT;Supervision - Intermittent-      Equipment Recommendations  None recommended by PT    Recommendations for Other Services       Precautions / Restrictions Precautions Precaution Comments: mod fall Restrictions Weight Bearing Restrictions: No     Mobility  Bed Mobility Overal bed mobility: Modified Independent             General bed mobility comments: increased effort, no assist needed    Transfers Overall transfer level: Modified independent Equipment used: None Transfers: Sit to/from  Stand Sit to Stand: Modified independent (Device/Increase time)         General transfer comment: increased effort with sit to stand, no assist.  Ambulation/Gait Ambulation/Gait assistance: Supervision Gait Distance (Feet): 150 Feet Assistive device: None Gait Pattern/deviations: Step-through pattern;Wide base of support Gait velocity: decreased   General Gait Details: patient is generally safe with ambulation. Ambulated on 6 lpm o2 and sats remained at 98% with ambulation. Did not require rest this session   Stairs             Wheelchair Mobility    Modified Rankin (Stroke Patients Only)       Balance Overall balance assessment: Mild deficits observed, not formally tested                                          Cognition Arousal/Alertness: Awake/alert Behavior During Therapy: Flat affect Overall Cognitive Status: Within Functional Limits for tasks assessed                                        Exercises      General Comments        Pertinent Vitals/Pain Pain Assessment: No/denies pain    Home Living                      Prior Function            PT  Goals (current goals can now be found in the care plan section) Acute Rehab PT Goals Patient Stated Goal: to breathe better, go home PT Goal Formulation: With patient Time For Goal Achievement: 11/20/20 Potential to Achieve Goals: Good Progress towards PT goals: Progressing toward goals    Frequency    Min 2X/week      PT Plan Current plan remains appropriate    Co-evaluation              AM-PAC PT "6 Clicks" Mobility   Outcome Measure  Help needed turning from your back to your side while in a flat bed without using bedrails?: None Help needed moving from lying on your back to sitting on the side of a flat bed without using bedrails?: None Help needed moving to and from a bed to a chair (including a wheelchair)?: None Help needed  standing up from a chair using your arms (e.g., wheelchair or bedside chair)?: None Help needed to walk in hospital room?: A Little Help needed climbing 3-5 steps with a railing? : A Little 6 Click Score: 22    End of Session Equipment Utilized During Treatment: Oxygen Activity Tolerance: Patient limited by fatigue;Other (comment) (SOB) Patient left: in bed Nurse Communication: Mobility status PT Visit Diagnosis: Muscle weakness (generalized) (M62.81);Difficulty in walking, not elsewhere classified (R26.2)     Time: 3154-0086 PT Time Calculation (min) (ACUTE ONLY): 20 min  Charges:  $Gait Training: 8-22 mins                     [employer], PT, GCS 11/09/20,3:13 PM

## 2020-11-09 NOTE — Progress Notes (Signed)
SATURATION QUALIFICATIONS: (This note is used to comply with regulatory documentation for home oxygen)  Patient Saturations on Room Air at Rest = 86%  Patient Saturations on Room Air while Ambulating = 86%  Patient Saturations on 3l Liters of oxygen while Ambulating = 87%  Please briefly explain why patient needs home oxygen: patient requires 4L/[place] to keep sats at 92%   Renaud Celli, Kae Heller, RN

## 2020-11-09 NOTE — Progress Notes (Addendum)
   Heart Failure Nurse Navigator Note  HFrEF 25-30%  Visited with patient today, upon entering the room he is sitting upright in bed eating breakfast.  Noted on his over the bed table was a sandwich from [employer], a large drinking cup from [facility] and 3 other large Styrofoam cups.  Had discussed with him yesterday the pitfalls of restaurant eating and relationship with higher sodium.  Mentioned that again today but he does not seem to care.  Have asked dietitian to assist.  Tresa Endo RN [facility]

## 2020-11-10 LAB — CBC
HCT: 38.2 % — ABNORMAL LOW (ref 39.0–52.0)
Hemoglobin: 12.7 g/dL — ABNORMAL LOW (ref 13.0–17.0)
MCH: 33.7 pg (ref 26.0–34.0)
MCHC: 33.2 g/dL (ref 30.0–36.0)
MCV: 101.3 fL — ABNORMAL HIGH (ref 80.0–100.0)
Platelets: 303 10*3/uL (ref 150–400)
RBC: 3.77 MIL/uL — ABNORMAL LOW (ref 4.22–5.81)
RDW: 13.6 % (ref 11.5–15.5)
WBC: 12.6 10*3/uL — ABNORMAL HIGH (ref 4.0–10.5)
nRBC: 0 % (ref 0.0–0.2)

## 2020-11-10 LAB — CULTURE, BLOOD (ROUTINE X 2)
Culture: NO GROWTH
Culture: NO GROWTH
Special Requests: ADEQUATE

## 2020-11-10 LAB — BASIC METABOLIC PANEL
Anion gap: 8 (ref 5–15)
BUN: 27 mg/dL — ABNORMAL HIGH (ref 6–20)
CO2: 41 mmol/L — ABNORMAL HIGH (ref 22–32)
Calcium: 8.7 mg/dL — ABNORMAL LOW (ref 8.9–10.3)
Chloride: 90 mmol/L — ABNORMAL LOW (ref 98–111)
Creatinine, Ser: 0.89 mg/dL (ref 0.61–1.24)
GFR, Estimated: >60 mL/min (ref >60)
Glucose, Bld: 146 mg/dL — ABNORMAL HIGH (ref 70–99)
Potassium: 4.2 mmol/L (ref 3.5–5.1)
Sodium: 139 mmol/L (ref 135–145)

## 2020-11-10 LAB — PHOSPHORUS: Phosphorus: 3 mg/dL (ref 2.5–4.6)

## 2020-11-10 MED ORDER — PREDNISONE 20 MG PO TABS: 40.0000 mg | ORAL_TABLET | Freq: Every day | ORAL | 0 refills | Status: AC

## 2020-11-10 MED ORDER — OXYCODONE-ACETAMINOPHEN 7.5-325 MG PO TABS: 1.0000 | ORAL_TABLET | Freq: Four times a day (QID) | ORAL | 0 refills | Status: AC | PRN

## 2020-11-10 MED ORDER — IPRATROPIUM-ALBUTEROL 0.5-2.5 (3) MG/3ML IN SOLN
3.0000 mL | Freq: Three times a day (TID) | RESPIRATORY_TRACT | Status: DC
  Administered 2020-11-10: 3 mL via RESPIRATORY_TRACT
  Filled 2020-11-10 (×2): qty 3

## 2020-11-10 NOTE — Progress Notes (Signed)
Discharge instructions reviewed with patient. Patient expresses understanding of medication changes to home regime. Patient has oxygen (4 Tanks) and walker in room to take home. Patient provided with scrubs to wear at time of discharge. Patient has someone to transport him home via private vehicle. Vitals WDL at time of discharge.

## 2020-11-10 NOTE — Discharge Summary (Addendum)
Physician Discharge Summary  Tommy Gaines BJY:[id number] DOB: 1965-04-22 DOA: 11/05/2020  PCP: Tommy Antu, DO  Admit date: 11/05/2020 Discharge date: 11/10/2020  Admitted From: home  Disposition:  home   Recommendations for Outpatient Follow-up:  Follow up with PCP in 1 week Recommend you get a referral for pulmon from your PCP   Home Health: no as pt refused HH  Equipment/Devices: 3.5L [place] & walker   Discharge Condition: stable  CODE STATUS: full  Diet recommendation: Heart Healthy   Brief/Interim Summary: HPI was taken from Dr. Para March: Tommy Gaines is a 55 y.o. male with medical history significant for  CAD with history of NSTEMI in 2021, complicated by acute systolic heart failure and cardiogenic shock with EF 25 to 30%, with multiple stents including the LAD, COPD with history of respiratory failure requiring mechanical intubation, not currently on home O2, class III obesity, HTN, CVA, who presents to the emergency room in respiratory distress with EMS recording O2 sat of 76% on room air, arriving on nonrebreather and receiving a nebulizer treatment in route.  Patient denies chest pain.  Was in his usual state of health until the day prior when he developed cough and shortness of breath.  He denied fever or chills.   ED course: On arrival patient was in respiratory distress, tachypneic to the 30s with O2 sat 93% on NRB.  Temp 99, pulse 103 with BP 143/88.  He was transitioned over to BiPAP Blood work normal WBC and procalcitonin less than 0.1 Troponin 26 with BNP 664 ABG on FiO2 35%: pH 7.45/PCO2 52/PO2 150 EtOH less than 10 COVID and flu negative   EKG, personally viewed and interpreted: Sinus bradycardia at 101 with no acute ST-T wave changes   Chest x-ray: Cardiac enlargement.  Suggestion of patchy perihilar infiltration on the left   Patient treated with Rocephin and azithromycin as well as duo nebs and methylprednisolone.  He continued to have increased work of  breathing and remains on BiPAP due to tachypnea.  Hospitalist consulted for admission.  As per Dr. Alanda Slim: 55 year old M with PMH of CAD s/p stents in 01/2020, combined CHF, COPD, CVA, HTN, GERD, osteoarthritis, depression, morbid obesity and ongoing tobacco use presenting with shortness of breath and hypoxia to 76% on room air, and admitted for COPD exacerbation, CHF exacerbation and possible pneumonia.  He required nonrebreather en route to ED. ABG 7.4 5/52/150/36.  BNP elevated to 665.  Mild troponin elevation to 26>> 28.  Pro-Cal negative.  CXR with cardiomegaly and possible left perihilar infiltrate.  Started on IV diuretics, steroid with breathing treatments and antibiotics  Hospital course from Dr. Mayford Knife 9/21-9/23/22: Pt was found to have acute hypoxic respiratory failure to likely CHF, COPD exacerbations & possible pneumonia. Pt was treated w/ IV lasix, IV rocephin, azithromycin, bronchodilators, incentive spirometry and supplemental oxygen. Pt completed the abx course while inpatient. Pt was unable to be weaned from supplemental oxygen so pt was d/c home w/ 3.5 L [place]. For more information, please see previous progress/consult notes.   Discharge Diagnoses:  Active Problems:   COPD with acute exacerbation (HCC)   Acute respiratory failure (HCC)   Acute on chronic systolic CHF (congestive heart failure) (HCC)   Acute respiratory failure with hypoxia and hypercapnia (HCC)   Dilated cardiomyopathy (HCC)   Obesity, Class III, BMI 40-49.9 (morbid obesity) (HCC)   Essential hypertension   Nicotine dependence  Acute hypoxic respiratory failure: likely secondary to COPD exacerbation. 76% when EMS arrived, and 84% with ambulation on  RA. Continue on steroids. Completed abx course. Continue on bronchodilators. Continue on supplemental oxygen and wean as tolerated.  Will be d/c w/ supplemental oxygen   Possible pneumonia: continue on IV rocephin, azithromycin, steroids & bronchodilators. Encourage  incentive spirometry   Acute on chronic combined CHF: TTE in 12/21 with LVEF of 25 to 30%, grade II diastolic dysfunction and mod LAE.  Has dyspnea and lower extremity edema. Continue on IV lasix, coreg & entresto. Monitor I/Os and daily weights   Hypophosphatemia: WNL today    Mildly elevated troponin: likely demand ischemia from the above. Hx of CAD s/p stents in 01/2020.  Hx of CVA: continue on aspirin, statin    HTN: continue on entresto, coreg    Tobacco use disorder: smoking about half a pack a day. Received smoking cessation counseling    Generalized weakness: PT/OT recs cardiopulmonary rehab   Morbid obesity: BMI 42.3.Marland Kitchen Complicates overall care & prognosis   Discharge Instructions  Discharge Instructions     Diet - low sodium heart healthy   Complete by: As directed    Discharge instructions   Complete by: As directed    F/u w/ PCP in 1 week. Recommend getting a pulmon referral from your PCP   Increase activity slowly   Complete by: As directed       Allergies as of 11/10/2020       Reactions   Acetaminophen Nausea And Vomiting   Lidocaine Rash        Medication List     STOP taking these medications    oxyCODONE 5 MG immediate release tablet Commonly known as: Oxy IR/ROXICODONE   ticagrelor 90 MG Tabs tablet Commonly known as: BRILINTA       TAKE these medications    albuterol 108 (90 Base) MCG/ACT inhaler Commonly known as: VENTOLIN HFA Inhale 2 puffs into the lungs 3 (three) times daily.   aspirin EC 81 MG tablet Take 81 mg by mouth.   atorvastatin 80 MG tablet Commonly known as: LIPITOR Take 1 tablet (80 mg total) by mouth daily.   budesonide-formoterol 160-4.5 MCG/ACT inhaler Commonly known as: SYMBICORT Inhale 1 puff into the lungs 2 (two) times daily.   carvedilol 3.125 MG tablet Commonly known as: COREG Take 1 tablet (3.125 mg total) by mouth 2 (two) times daily with a meal.   clopidogrel 75 MG tablet Commonly known as:  PLAVIX Take 75 mg by mouth every morning.   docusate sodium 100 MG capsule Commonly known as: COLACE Take 1 capsule (100 mg total) by mouth 2 (two) times daily.   FLUoxetine 20 MG capsule Commonly known as: PROZAC Take by mouth.   folic acid 1 MG tablet Commonly known as: FOLVITE Take 1 tablet (1 mg total) by mouth daily.   furosemide 40 MG tablet Commonly known as: LASIX Take 80 mg by mouth daily. What changed: Another medication with the same name was removed. Continue taking this medication, and follow the directions you see here.   gabapentin 300 MG capsule Commonly known as: NEURONTIN Take 300 mg by mouth 3 (three) times daily.   omeprazole 20 MG capsule Commonly known as: PRILOSEC Take 20 mg by mouth daily as needed (Acid reflux).   oxyCODONE-acetaminophen 7.5-325 MG tablet Commonly known as: PERCOCET Take 1 tablet by mouth every 6 (six) hours as needed for up to 3 days for moderate pain.   polyethylene glycol 17 g packet Commonly known as: MIRALAX / GLYCOLAX Take 17 g by mouth daily.  potassium chloride 10 MEQ tablet Commonly known as: KLOR-CON Take 1 tablet (10 mEq total) by mouth daily.   predniSONE 20 MG tablet Commonly known as: DELTASONE Take 2 tablets (40 mg total) by mouth daily for 5 days.   sacubitril-valsartan 24-26 MG Commonly known as: ENTRESTO Take 1 tablet by mouth 2 (two) times daily.   Spiriva Respimat 2.5 MCG/ACT Aers Generic drug: Tiotropium Bromide Monohydrate Inhale 2 puffs into the lungs daily.   spironolactone 25 MG tablet Commonly known as: ALDACTONE Take 1 tablet (25 mg total) by mouth daily.   thiamine 100 MG tablet Take 1 tablet (100 mg total) by mouth daily.               Durable Medical Equipment  (From admission, onward)           Start     Ordered   11/10/20 1307  For home use only DME Walker rolling  Once       Comments: Bariatric walker  Question Answer Comment  Walker: With 5 Inch Wheels   Patient  needs a walker to treat with the following condition Generalized weakness      11/10/20 1306   11/10/20 1148  For home use only DME oxygen  Once       Question Answer Comment  Length of Need 12 Months   Mode or (Route) Nasal cannula   Liters per Minute 3.5   Frequency Continuous (stationary and portable oxygen unit needed)   Oxygen conserving device Yes   Oxygen delivery system Gas      11/10/20 1147            Follow-up Information     Callwood, Dwayne D, MD Follow up in 2 week(s).   Specialties: Cardiology, Internal Medicine Contact information: [address] [place] [address] [phone]                Allergies  Allergen Reactions   Acetaminophen Nausea And Vomiting   Lidocaine Rash    Consultations: Cardio    Procedures/Studies: DG Chest Portable 1 View  Result Date: 11/05/2020 CLINICAL DATA:  Shortness of breath. Difficulty breathing. COPD and cold symptoms. EXAM: PORTABLE CHEST 1 VIEW COMPARISON:  01/31/2020 FINDINGS: Cardiac enlargement. Possible patchy perihilar infiltration on the left. This may indicate pneumonia or early edema. No pleural effusions. No pneumothorax. Tortuous aorta. Postoperative changes in the left shoulder. IMPRESSION: Cardiac enlargement. Suggestion of patchy perihilar infiltration on the left. Electronically Signed   By: Burman Nieves M.D.   On: 11/05/2020 22:56   ECHOCARDIOGRAM COMPLETE  Result Date: 11/07/2020    ECHOCARDIOGRAM REPORT   Patient Name:   ARLEN HAVRANEK Date of Exam: 11/07/2020 Medical Rec #:  [id number]       Height:       73.0 in Accession #:    [id number]      Weight:       333.8 lb Date of Birth:  September 22, 1965       BSA:          2.675 m Patient Age:    55 years        BP:           117/69 mmHg Patient Gender: M               HR:           69 bpm. Exam Location:  [facility] Procedure: 2D Echo, Cardiac Doppler and Color Doppler Indications:     CHF-  acute diastolic I50.31  History:         Patient has prior  history of Echocardiogram examinations, most                  recent 01/30/2020. COPD and Stroke; Risk Factors:Hypertension.  Sonographer:     Cristela Blue Referring Phys:  [id number] Andris Baumann Diagnosing Phys: Alwyn Pea MD IMPRESSIONS  1. Left ventricular ejection fraction, by estimation, is 35 to 40%. The left ventricle has moderately decreased function. The left ventricle demonstrates regional wall motion abnormalities (see scoring diagram/findings for description). The left ventricular internal cavity size was mildly dilated. Left ventricular diastolic parameters were normal.  2. Right ventricular systolic function is low normal. The right ventricular size is mildly enlarged.  3. The mitral valve is normal in structure. Mild mitral valve regurgitation.  4. The aortic valve is normal in structure. Aortic valve regurgitation is not visualized. FINDINGS  Left Ventricle: Ant/Apical/Septal hypo. Left ventricular ejection fraction, by estimation, is 35 to 40%. The left ventricle has moderately decreased function. The left ventricle demonstrates regional wall motion abnormalities. The left ventricular internal cavity size was mildly dilated. There is borderline left ventricular hypertrophy. Left ventricular diastolic parameters were normal. Right Ventricle: The right ventricular size is mildly enlarged. No increase in right ventricular wall thickness. Right ventricular systolic function is low normal. Left Atrium: Left atrial size was normal in size. Right Atrium: Right atrial size was normal in size. Pericardium: There is no evidence of pericardial effusion. Mitral Valve: The mitral valve is normal in structure. Mild mitral valve regurgitation. Tricuspid Valve: The tricuspid valve is normal in structure. Tricuspid valve regurgitation is mild. Aortic Valve: The aortic valve is normal in structure. Aortic valve regurgitation is not visualized. Aortic valve mean gradient measures 2.0 mmHg. Aortic valve peak  gradient measures 3.5 mmHg. Aortic valve area, by VTI measures 2.67 cm. Pulmonic Valve: The pulmonic valve was normal in structure. Pulmonic valve regurgitation is not visualized. Aorta: The ascending aorta was not well visualized. IAS/Shunts: No atrial level shunt detected by color flow Doppler.  LEFT VENTRICLE PLAX 2D LVIDd:         6.40 cm      Diastology LVIDs:         5.20 cm      LV e' medial:    5.98 cm/s LV PW:         1.20 cm      LV E/e' medial:  14.1 LV IVS:        0.95 cm      LV e' lateral:   5.11 cm/s LVOT diam:     2.10 cm      LV E/e' lateral: 16.5 LV SV:         39 LV SV Index:   15 LVOT Area:     3.46 cm  LV Volumes (MOD) LV vol d, MOD A4C: 267.0 ml LV vol s, MOD A4C: 153.0 ml LV SV MOD A4C:     267.0 ml RIGHT VENTRICLE RV Basal diam:  3.90 cm RV S prime:     12.20 cm/s TAPSE (M-mode): 4.5 cm LEFT ATRIUM           Index       RIGHT ATRIUM           Index LA diam:      4.00 cm 1.50 cm/m  RA Area:     18.60 cm LA Vol (A4C): 45.9 ml 17.16 ml/m RA  Volume:   48.20 ml  18.02 ml/m  AORTIC VALVE                   PULMONIC VALVE AV Area (Vmax):    2.28 cm    PV Vmax:        1.02 m/s AV Area (Vmean):   2.29 cm    PV Peak grad:   4.2 mmHg AV Area (VTI):     2.67 cm    RVOT Peak grad: 4 mmHg AV Vmax:           93.25 cm/s AV Vmean:          62.450 cm/s AV VTI:            0.148 m AV Peak Grad:      3.5 mmHg AV Mean Grad:      2.0 mmHg LVOT Vmax:         61.40 cm/s LVOT Vmean:        41.200 cm/s LVOT VTI:          0.114 m LVOT/AV VTI ratio: 0.77  AORTA Ao Root diam: 4.00 cm MITRAL VALVE               TRICUSPID VALVE MV Area (PHT): 3.95 cm    TR Peak grad:   15.5 mmHg MV Decel Time: 192 msec    TR Vmax:        197.00 cm/s MV E velocity: 84.40 cm/s MV A velocity: 75.80 cm/s  SHUNTS MV E/A ratio:  1.11        Systemic VTI:  0.11 m                            Systemic Diam: 2.10 cm Dwayne D Callwood MD Electronically signed by Alwyn Pea MD Signature Date/Time: 11/07/2020/9:27:26 PM    Final    (Echo,  Carotid, EGD, Colonoscopy, ERCP)    Subjective: Pt c/o chronic pain    Discharge Exam: Vitals:   11/10/20 1300 11/10/20 1316  BP: 131/72   Pulse: 93   Resp:    Temp: 98.1 F (36.7 C)   SpO2: 97% 90%   Vitals:   11/10/20 0730 11/10/20 0810 11/10/20 1300 11/10/20 1316  BP:  136/74 131/72   Pulse:  96 93   Resp:      Temp:  98.2 F (36.8 C) 98.1 F (36.7 C)   TempSrc:  Oral Oral   SpO2: 96% 97% 97% 90%  Weight:      Height:        General: Pt is alert, awake, not in acute distress Cardiovascular: S1/S2 +, no rubs, no gallops Respiratory: diminished breath sounds b/l  Abdominal: Soft, NT, obese, bowel sounds + Extremities: no cyanosis    The results of significant diagnostics from this hospitalization (including imaging, microbiology, ancillary and laboratory) are listed below for reference.     Microbiology: Recent Results (from the past 240 hour(s))  Resp Panel by RT-PCR (Flu A&B, Covid) Nasopharyngeal Swab     Status: None   Collection Time: 11/05/20 10:42 PM   Specimen: Nasopharyngeal Swab; Nasopharyngeal(NP) swabs in vial transport medium  Result Value Ref Range Status   SARS Coronavirus 2 by RT PCR NEGATIVE NEGATIVE Final    Comment: (NOTE) SARS-CoV-2 target nucleic acids are NOT DETECTED.  The SARS-CoV-2 RNA is generally detectable in upper respiratory specimens during the acute phase of infection. The lowest concentration of SARS-CoV-2  viral copies this assay can detect is 138 copies/mL. A negative result does not preclude SARS-Cov-2 infection and should not be used as the sole basis for treatment or other patient management decisions. A negative result may occur with  improper specimen collection/handling, submission of specimen other than nasopharyngeal swab, presence of viral mutation(s) within the areas targeted by this assay, and inadequate number of viral copies(<138 copies/mL). A negative result must be combined with clinical observations,  patient history, and epidemiological information. The expected result is Negative.  Fact Sheet for Patients:  BloggerCourse.com  Fact Sheet for Healthcare Providers:  SeriousBroker.it  This test is no t yet approved or cleared by the [place] FDA and  has been authorized for detection and/or diagnosis of SARS-CoV-2 by FDA under an Emergency Use Authorization (EUA). This EUA will remain  in effect (meaning this test can be used) for the duration of the COVID-19 declaration under Section 564(b)(1) of the Act, 21 U.S.C.section 360bbb-3(b)(1), unless the authorization is terminated  or revoked sooner.       Influenza A by PCR NEGATIVE NEGATIVE Final   Influenza B by PCR NEGATIVE NEGATIVE Final    Comment: (NOTE) The Xpert Xpress SARS-CoV-2/FLU/RSV plus assay is intended as an aid in the diagnosis of influenza from Nasopharyngeal swab specimens and should not be used as a sole basis for treatment. Nasal washings and aspirates are unacceptable for Xpert Xpress SARS-CoV-2/FLU/RSV testing.  Fact Sheet for Patients: BloggerCourse.com  Fact Sheet for Healthcare Providers: SeriousBroker.it  This test is not yet approved or cleared by the [place] FDA and has been authorized for detection and/or diagnosis of SARS-CoV-2 by FDA under an Emergency Use Authorization (EUA). This EUA will remain in effect (meaning this test can be used) for the duration of the COVID-19 declaration under Section 564(b)(1) of the Act, 21 U.S.C. section 360bbb-3(b)(1), unless the authorization is terminated or revoked.  Performed at [facility], [address] [place], [place] [address]   Culture, blood (Routine X 2) w Reflex to ID Panel     Status: None   Collection Time: 11/05/20 10:42 PM   Specimen: BLOOD  Result Value Ref Range Status   Specimen Description BLOOD LEFT ASSIST CONTROL   Final   Special Requests   Final    BOTTLES DRAWN AEROBIC AND ANAEROBIC Blood Culture adequate volume   Culture   Final    NO GROWTH 5 DAYS Performed at [facility], [address] [place], [place] [address]    Report Status 11/10/2020 FINAL  Final  Culture, blood (Routine X 2) w Reflex to ID Panel     Status: None   Collection Time: 11/05/20 11:18 PM   Specimen: BLOOD  Result Value Ref Range Status   Specimen Description BLOOD LEFT WRIST  Final   Special Requests   Final    BOTTLES DRAWN AEROBIC AND ANAEROBIC Blood Culture results may not be optimal due to an excessive volume of blood received in culture bottles   Culture   Final    NO GROWTH 5 DAYS Performed at [facility], [address] [place], [place] [address]    Report Status 11/10/2020 FINAL  Final     Labs: BNP (last 3 results) Recent Labs    01/29/20 1041 11/05/20 2242  BNP 377.3* 664.4*   Basic Metabolic Panel: Recent Labs  Lab 11/05/20 2242 11/07/20 0729 11/08/20 0356 11/09/20 0408 11/10/20 0612  NA 136 140 138 137 139  K 3.6 4.3 4.1  3.6 4.2  CL 93* 94* 93* 88* 90*  CO2 35* 37* 37* 40* 41*  GLUCOSE 133* 136* 190* 228* 146*  BUN 11 24* 27* 28* 27*  CREATININE 1.00 0.98 1.07 1.07 0.89  CALCIUM 9.0 8.8* 8.7* 8.6* 8.7*  MG  --  2.2 2.4  --   --   PHOS  --  3.5 1.8* 2.2* 3.0   Liver Function Tests: Recent Labs  Lab 11/05/20 2242 11/07/20 0729 11/08/20 0356  AST 23  --   --   ALT 17  --   --   ALKPHOS 80  --   --   BILITOT 1.2  --   --   PROT 7.4  --   --   ALBUMIN 3.6 3.5 3.5   No results for input(s): LIPASE, AMYLASE in the last 168 hours. No results for input(s): AMMONIA in the last 168 hours. CBC: Recent Labs  Lab 11/05/20 2242 11/07/20 0729 11/08/20 0356 11/09/20 0408 11/10/20 0612  WBC 10.0 13.6* 15.5* 15.0* 12.6*  NEUTROABS 8.4*  --   --   --   --   HGB 12.5* 12.7* 12.7* 12.8* 12.7*  HCT 37.0* 38.0* 39.5 39.3 38.2*  MCV 98.7 102.4* 103.1* 102.9* 101.3*   PLT 224 293 341 347 303   Cardiac Enzymes: No results for input(s): CKTOTAL, CKMB, CKMBINDEX, TROPONINI in the last 168 hours. BNP: Invalid input(s): POCBNP CBG: No results for input(s): GLUCAP in the last 168 hours. D-Dimer No results for input(s): DDIMER in the last 72 hours. Hgb A1c No results for input(s): HGBA1C in the last 72 hours. Lipid Profile No results for input(s): CHOL, HDL, LDLCALC, TRIG, CHOLHDL, LDLDIRECT in the last 72 hours. Thyroid function studies No results for input(s): TSH, T4TOTAL, T3FREE, THYROIDAB in the last 72 hours.  Invalid input(s): FREET3 Anemia work up No results for input(s): VITAMINB12, FOLATE, FERRITIN, TIBC, IRON, RETICCTPCT in the last 72 hours. Urinalysis    Component Value Date/Time   COLORURINE AMBER (A) 01/29/2020 1216   APPEARANCEUR CLOUDY (A) 01/29/2020 1216   LABSPEC 1.019 01/29/2020 1216   PHURINE 5.0 01/29/2020 1216   GLUCOSEU 150 (A) 01/29/2020 1216   HGBUR SMALL (A) 01/29/2020 1216   BILIRUBINUR NEGATIVE 01/29/2020 1216   KETONESUR NEGATIVE 01/29/2020 1216   PROTEINUR >=300 (A) 01/29/2020 1216   NITRITE NEGATIVE 01/29/2020 1216   LEUKOCYTESUR NEGATIVE 01/29/2020 1216   Sepsis Labs Invalid input(s): PROCALCITONIN,  WBC,  LACTICIDVEN Microbiology Recent Results (from the past 240 hour(s))  Resp Panel by RT-PCR (Flu A&B, Covid) Nasopharyngeal Swab     Status: None   Collection Time: 11/05/20 10:42 PM   Specimen: Nasopharyngeal Swab; Nasopharyngeal(NP) swabs in vial transport medium  Result Value Ref Range Status   SARS Coronavirus 2 by RT PCR NEGATIVE NEGATIVE Final    Comment: (NOTE) SARS-CoV-2 target nucleic acids are NOT DETECTED.  The SARS-CoV-2 RNA is generally detectable in upper respiratory specimens during the acute phase of infection. The lowest concentration of SARS-CoV-2 viral copies this assay can detect is 138 copies/mL. A negative result does not preclude SARS-Cov-2 infection and should not be used as the  sole basis for treatment or other patient management decisions. A negative result may occur with  improper specimen collection/handling, submission of specimen other than nasopharyngeal swab, presence of viral mutation(s) within the areas targeted by this assay, and inadequate number of viral copies(<138 copies/mL). A negative result must be combined with clinical observations, patient history, and epidemiological information. The expected result is Negative.  Fact Sheet for Patients:  BloggerCourse.com  Fact Sheet for Healthcare Providers:  SeriousBroker.it  This test is no t yet approved or cleared by the [place] FDA and  has been authorized for detection and/or diagnosis of SARS-CoV-2 by FDA under an Emergency Use Authorization (EUA). This EUA will remain  in effect (meaning this test can be used) for the duration of the COVID-19 declaration under Section 564(b)(1) of the Act, 21 U.S.C.section 360bbb-3(b)(1), unless the authorization is terminated  or revoked sooner.       Influenza A by PCR NEGATIVE NEGATIVE Final   Influenza B by PCR NEGATIVE NEGATIVE Final    Comment: (NOTE) The Xpert Xpress SARS-CoV-2/FLU/RSV plus assay is intended as an aid in the diagnosis of influenza from Nasopharyngeal swab specimens and should not be used as a sole basis for treatment. Nasal washings and aspirates are unacceptable for Xpert Xpress SARS-CoV-2/FLU/RSV testing.  Fact Sheet for Patients: BloggerCourse.com  Fact Sheet for Healthcare Providers: SeriousBroker.it  This test is not yet approved or cleared by the [place] FDA and has been authorized for detection and/or diagnosis of SARS-CoV-2 by FDA under an Emergency Use Authorization (EUA). This EUA will remain in effect (meaning this test can be used) for the duration of the COVID-19 declaration under Section 564(b)(1) of the  Act, 21 U.S.C. section 360bbb-3(b)(1), unless the authorization is terminated or revoked.  Performed at [facility], [address] [place], [place] [address]   Culture, blood (Routine X 2) w Reflex to ID Panel     Status: None   Collection Time: 11/05/20 10:42 PM   Specimen: BLOOD  Result Value Ref Range Status   Specimen Description BLOOD LEFT ASSIST CONTROL  Final   Special Requests   Final    BOTTLES DRAWN AEROBIC AND ANAEROBIC Blood Culture adequate volume   Culture   Final    NO GROWTH 5 DAYS Performed at [facility], [address] [place], [place] [address]    Report Status 11/10/2020 FINAL  Final  Culture, blood (Routine X 2) w Reflex to ID Panel     Status: None   Collection Time: 11/05/20 11:18 PM   Specimen: BLOOD  Result Value Ref Range Status   Specimen Description BLOOD LEFT WRIST  Final   Special Requests   Final    BOTTLES DRAWN AEROBIC AND ANAEROBIC Blood Culture results may not be optimal due to an excessive volume of blood received in culture bottles   Culture   Final    NO GROWTH 5 DAYS Performed at [facility], [address] [place], [place] [address]    Report Status 11/10/2020 FINAL  Final     Time coordinating discharge: Over 30 minutes  SIGNED:   Charise Killian, MD  [facility] 11/10/2020, 2:07 PM Pager   If 7PM-7AM, please contact night-coverage

## 2020-11-10 NOTE — Progress Notes (Signed)
Occupational Therapy Treatment Patient Details Name: Tommy Gaines MRN: [id number] DOB: 1965/06/11 Today's Date: 11/10/2020   History of present illness Tommy Gaines is a 55 y.o. male with medical history significant for  CAD with history of NSTEMI in 2021, complicated by acute systolic heart failure and cardiogenic shock with EF 25 to 30%, with multiple stents including the LAD, COPD with history of respiratory failure requiring mechanical intubation, not currently on home O2, class III obesity, HTN, CVA, who presents to the emergency room in respiratory distress   OT comments  Pt seen for OT Tx this date to f/u re: safety with ADLs/ADL mobility. Pt requires SUPV for standing ADLs and fxl mobility d/t increased O2 needs with all activity. Pt requires cues to self-initiate rest breaks and OT provides education re: Rate of Perceived Exertion (RPE). Pt's O2 sats monitored throughout both on RA and with nasal cannula and are recorded below. OT educates pt re: ECS and handout provided. Will continue to follow acutely. Anticipate pt will require 2-3Lnc with activities of daily living.    At rest With activity  On RA 91-92% <83%  On RA after 2 mins 85-88% 84-85%  On 2L 92% 87-89%  On 3.5L (what pt was on when OT presented to room) 98% 93%     Recommendations for follow up therapy are one component of a multi-disciplinary discharge planning process, led by the attending physician.  Recommendations may be updated based on patient status, additional functional criteria and insurance authorization.    Follow Up Recommendations  No OT follow up;Other (comment) (cardiopulmonary rehab)    Equipment Recommendations  None recommended by OT;Other (comment) (2-3Lnc with activity.)    Recommendations for Other Services      Precautions / Restrictions Precautions Precautions: Fall Precaution Comments: mod fall Restrictions Weight Bearing Restrictions: No       Mobility Bed Mobility Overal bed  mobility: Modified Independent                  Transfers Overall transfer level: Modified independent Equipment used: None Transfers: Sit to/from Stand Sit to Stand: Modified independent (Device/Increase time)         General transfer comment: increased effort primarily d/t O2 needs/RR    Balance Overall balance assessment: Mild deficits observed, not formally tested                                         ADL either performed or assessed with clinical judgement   ADL Overall ADL's : Needs assistance/impaired                                     Functional mobility during ADLs: Set up;Supervision/safety (no AD, SUPV primarily for O2 line mgt and for cues to initate rest breaks when appropriate.)       Vision Patient Visual Report: No change from baseline     Perception     Praxis      Cognition Arousal/Alertness: Awake/alert Behavior During Therapy: Flat affect Overall Cognitive Status: Within Functional Limits for tasks assessed                                          Exercises Other Exercises Other  Exercises: OT ed re: ECS, handout issued   Shoulder Instructions       General Comments on 4L at rest initially upon entering room and spO2 at rest 98%, on RA at rest, drops to 91-92% while still in bed. upon coming to sitting and after 2-3 mins of being off nasl cannula, decreases to 85-86% at rest. 82% with activity on RA. Requires 2-3Lnc wtih activity to sustain sats >88%    Pertinent Vitals/ Pain       Pain Assessment: No/denies pain  Home Living                                          Prior Functioning/Environment              Frequency  Min 1X/week        Progress Toward Goals  OT Goals(current goals can now be found in the care plan section)  Progress towards OT goals: Progressing toward goals  Acute Rehab OT Goals Patient Stated Goal: to breathe better, go  home OT Goal Formulation: With patient Time For Goal Achievement: 11/20/20 Potential to Achieve Goals: Good  Plan Discharge plan remains appropriate    Co-evaluation                 AM-PAC OT "6 Clicks" Daily Activity     Outcome Measure   Help from another person eating meals?: None Help from another person taking care of personal grooming?: A Little Help from another person toileting, which includes using toliet, bedpan, or urinal?: A Little Help from another person bathing (including washing, rinsing, drying)?: A Little Help from another person to put on and taking off regular upper body clothing?: None Help from another person to put on and taking off regular lower body clothing?: A Little (wears slip ons at baseline) 6 Click Score: 20    End of Session Equipment Utilized During Treatment: Oxygen  OT Visit Diagnosis: Other abnormalities of gait and mobility (R26.89)   Activity Tolerance Patient tolerated treatment well   Patient Left in bed;with call bell/phone within reach   Nurse Communication Mobility status        Time: 7262-0355 OT Time Calculation (min): 39 min  Charges: OT General Charges $OT Visit: 1 Visit OT Treatments $Self Care/Home Management : 8-22 mins $Therapeutic Activity: 23-37 mins  Rejeana Brock, MS, OTR/L ascom [phone] 11/10/20, 10:54 AM

## 2020-11-10 NOTE — Progress Notes (Signed)
[facility] Cardiology    SUBJECTIVE: No new complaints still has some mild shortness of breath minimal leg swelling no palpitation tachycardia chest pain   Vitals:   11/09/20 2054 11/09/20 2350 11/10/20 0442 11/10/20 0500  BP:  135/65 121/69   Pulse:  97 95   Resp:  20 18   Temp:  98.1 F (36.7 C) 98.1 F (36.7 C)   TempSrc:      SpO2: 97% 96% 96%   Weight:    (!) 145.4 kg  Height:         Intake/Output Summary (Last 24 hours) at 11/10/2020 3419 Last data filed at 11/10/2020 0330 Gross per 24 hour  Intake 967.56 ml  Output 1900 ml  Net -932.44 ml      PHYSICAL EXAM  General: Well developed, well nourished, in no acute distress HEENT:  Normocephalic and atramatic Neck:  No JVD.  Lungs: Clear bilaterally to auscultation and percussion. Heart: HRRR . Normal S1 and S2 without gallops or murmurs.  Abdomen: Bowel sounds are positive, abdomen soft and non-tender  Msk:  Back normal, normal gait. Normal strength and tone for age. Extremities: No clubbing, cyanosis or edema.   Neuro: Alert and oriented X 3. Psych:  Good affect, responds appropriately   LABS: Basic Metabolic Panel: Recent Labs    11/07/20 0729 11/08/20 0356 11/09/20 0408 11/10/20 0612  NA 140 138 137 139  K 4.3 4.1 3.6 4.2  CL 94* 93* 88* 90*  CO2 37* 37* 40* 41*  GLUCOSE 136* 190* 228* 146*  BUN 24* 27* 28* 27*  CREATININE 0.98 1.07 1.07 0.89  CALCIUM 8.8* 8.7* 8.6* 8.7*  MG 2.2 2.4  --   --   PHOS 3.5 1.8* 2.2* 3.0   Liver Function Tests: Recent Labs    11/07/20 0729 11/08/20 0356  ALBUMIN 3.5 3.5   No results for input(s): LIPASE, AMYLASE in the last 72 hours. CBC: Recent Labs    11/09/20 0408 11/10/20 0612  WBC 15.0* 12.6*  HGB 12.8* 12.7*  HCT 39.3 38.2*  MCV 102.9* 101.3*  PLT 347 303   Cardiac Enzymes: No results for input(s): CKTOTAL, CKMB, CKMBINDEX, TROPONINI in the last 72 hours. BNP: Invalid input(s): POCBNP D-Dimer: No results for input(s): DDIMER in the last 72  hours. Hemoglobin A1C: Recent Labs    11/07/20 0729  HGBA1C 5.4   Fasting Lipid Panel: No results for input(s): CHOL, HDL, LDLCALC, TRIG, CHOLHDL, LDLDIRECT in the last 72 hours. Thyroid Function Tests: Recent Labs    11/07/20 0729  TSH 0.598   Anemia Panel: No results for input(s): VITAMINB12, FOLATE, FERRITIN, TIBC, IRON, RETICCTPCT in the last 72 hours.  No results found.   Echo mildly reduced left ventricular function 35 to 40%  TELEMETRY: Normal sinus rhythm nonspecific ST-T wave changes:  ASSESSMENT AND PLAN:  Active Problems:   COPD with acute exacerbation (HCC)   Acute respiratory failure (HCC)   Acute on chronic systolic CHF (congestive heart failure) (HCC)   Acute respiratory failure with hypoxia and hypercapnia (HCC)   Dilated cardiomyopathy (HCC)   Obesity, Class III, BMI 40-49.9 (morbid obesity) (HCC)   Essential hypertension   Nicotine dependence   Plan Continue respiratory support inhalers as necessary Continue treatment for cardiomyopathy heart failure Recommend weight loss exercise portion control Advised patient to resume CPAP use for obstructive sleep apnea Patient should be advised to refrain from tobacco abuse Increase activity to ambulate in the halls Continue medical therapy for cardiomyopathy heart failure and COPD  Continue statin therapy for hyperlipidemia Maintain Entresto Coreg diuretics for heart failure cardiomyopathy Continue Protonix for GERD type symptoms   Alwyn Pea, MD, 11/10/2020 6:52 AM

## 2020-11-10 NOTE — TOC Initial Note (Signed)
Transition of Care [facility]) - Initial/Assessment Note    Patient Details  Name: Tommy Gaines MRN: [id number] Date of Birth: 04-Jan-1966  [facility]) CM/SW Contact:    Gildardo Griffes, LCSW Phone Number: 11/10/2020, 1:05 PM  Clinical Narrative:                  CSW spoke with patient at bedside regarding discharge planning include home health services. He reports that he declines home health at this time. Reports he would be in agreement with a walker, per Bjorn Loser with [employer] reports will need bariatric walker.   CSW notes patient will need 2-3L of oxygen, is set up with [employer].   Rhonda to deliver both oxygen and walker into patient room. Patient reports that someone will pick him up from hospital.   No other discharge needs identified at this time.   Expected Discharge Plan: Home/Self Care Barriers to Discharge: No Barriers Identified   Patient Goals and CMS Choice Patient states their goals for this hospitalization and ongoing recovery are:: to go home CMS Medicare.gov Compare Post Acute Care list provided to:: Patient Choice offered to / list presented to : Patient  Expected Discharge Plan and Services Expected Discharge Plan: Home/Self Care       Living arrangements for the past 2 months: Single Family Home                 DME Arranged: Walker rolling, Oxygen DME Agency: [employer] Date DME Agency Contacted: 11/10/20 Time DME Agency Contacted: 1304 Representative spoke with at DME Agency: Bjorn Loser            Prior Living Arrangements/Services Living arrangements for the past 2 months: Single Family Home Lives with:: Self Patient language and need for interpreter reviewed:: Yes Do you feel safe going back to the place where you live?: Yes      Need for Family Participation in Patient Care: Yes (Comment) Care giver support system in place?: Yes (comment)   Criminal Activity/Legal Involvement Pertinent to Current Situation/Hospitalization: No - Comment  as needed  Activities of Daily Living Home Assistive Devices/Equipment: None ADL Screening (condition at time of admission) Patient's cognitive ability adequate to safely complete daily activities?: Yes Is the patient deaf or have difficulty hearing?: No Does the patient have difficulty seeing, even when wearing glasses/contacts?: No Does the patient have difficulty concentrating, remembering, or making decisions?: No Patient able to express need for assistance with ADLs?: No Does the patient have difficulty dressing or bathing?: No Independently performs ADLs?: Yes (appropriate for developmental age) Does the patient have difficulty walking or climbing stairs?: No Weakness of Legs: None Weakness of Arms/Hands: None  Permission Sought/Granted Permission sought to share information with : Case Manager, Magazine features editor, Family Supports Permission granted to share information with : Yes, Verbal Permission Granted              Emotional Assessment Appearance:: Appears stated age Attitude/Demeanor/Rapport: Gracious Affect (typically observed): Calm Orientation: : Oriented to Self, Oriented to Place, Oriented to  Time, Oriented to Situation Alcohol / Substance Use: Not Applicable Psych Involvement: No (comment)  Admission diagnosis:  Acute respiratory failure (HCC) [J96.00] COPD exacerbation (HCC) [J44.1] Acute respiratory failure with hypoxia (HCC) [J96.01] Community acquired pneumonia of left lower lobe of lung [J18.9] Patient Active Problem List   Diagnosis Date Noted   Acute respiratory failure with hypoxia and hypercapnia (HCC) 11/06/2020   Dilated cardiomyopathy (HCC) 11/06/2020   Obesity, Class III, BMI 40-49.9 (morbid obesity) (  HCC) 11/06/2020   Essential hypertension 11/06/2020   Nicotine dependence 11/06/2020   Endotracheally intubated    Alcohol use    Acute encephalopathy    Non-ST elevation (NSTEMI) myocardial infarction [facility])    Acute on chronic  systolic CHF (congestive heart failure) (HCC)    Respiratory failure (HCC) 01/29/2020   Acute respiratory failure (HCC) 01/29/2020   Chronic pain syndrome 09/10/2017   Primary osteoarthritis of both knees 09/10/2017   Primary osteoarthritis of both shoulders 09/10/2017   GERD (gastroesophageal reflux disease) 10/17/2016   Chronic pain of both knees 10/03/2015   Chronic pain of both shoulders 10/03/2015   History of replacement of both shoulder joints 10/03/2015   Tendonitis of elbow, left 08/13/2012   Obesity 08/15/2011   COPD with acute exacerbation (HCC) 07/30/2011   PCP:  Marta Antu, DO Pharmacy:   [facility] [phone] - [place], [place] - [address] [place] [address] Phone: [phone] Fax: [phone]  [facility] [address] [place] [address] Phone: [phone] Fax: [phone]     Social Determinants of Health (SDOH) Interventions    Readmission Risk Interventions Readmission Risk Prevention Plan 02/01/2020  Transportation Screening Complete  PCP or Specialist Appt within 5-7 Days Complete  Home Care Screening Complete  Medication Review (RN CM) Complete  Some recent data might be hidden

## 2020-11-12 NOTE — Progress Notes (Deleted)
   Patient ID: Tommy Gaines, male    DOB: 11/22/1965, 55 y.o.   MRN: [id number]  HPI  Tommy Gaines is a 55 y/o male with a history of  Echo report from 11/07/20 reviewed and showed an EF of 35-40% along with mild Tommy.   RHC/LHC 01/30/20 showed: LM: Normal LAD: 100% mid LAD CTO after a large diagonal. Diagonal has 75% long eccentric lesion.          Left-to-left and right-to-left collaterals from large diagonals, ramus, LCx, and distal RCA to mid LAD. LAD itself appears to terminate short of apex  Ramus: Prox 50% stenosis LCx: Focal 75 % prox stenosis RCA: Prox 95%, mid 75% stenosis. (Culprit vessel) TIMI flow II distally RA: 14 mmHg RV: 51/12 mmHg PA: 51/29 mmHg, mean PAP 39 mmHg PCW: 24-28 mmHg Qp/Qs: 1.0 CO: 11.2 L/min CI: CI 4.2 L/min/m2 PAPi 0.7 Elevated filling pressures without shock. Able to titrate norepinephrine off in the lab Successful coronary intervention     PTCA and stent placement mid RCA Resolute Onyx 3.5X26 mm DES     Post-dilatation using 3.75X15 mm [place] balloon, dilated up to 22 atm   Admitted 11/05/20 due to shortness of breath and hypoxia with sats down to 76% on room air. Placed on nonbreather along with giving nebulizer treatment. Transitioned over to bipap. COVID and flu were negative. Given antibiotics and IV solumedrol. Given IV lasix with transition to oral diuretics. Could not be weaned completely weaned off oxygen. Discharged after 5 days.   He presents today for his initial visit with a chief complaint of   Review of Systems    Physical Exam    Assessment & Plan:  1: Chronic heart failure with reduced ejection fraction- - NYHA class - saw cardiology Morton Stall) 09/25/20 - BNP 11/05/20 was 664.4  2: HTN- - BP - saw PCP  - BMP 11/10/20 reviewed and showed sodium 139, potassium 4.2, creatinine 0.89 and GFR >60  3: COPD- - wearing oxgyen

## 2020-11-14 ENCOUNTER — Ambulatory Visit: Payer: Medicaid Other | Admitting: Family

## 2020-11-14 ENCOUNTER — Telehealth: Payer: Self-pay | Admitting: Family

## 2020-11-14 NOTE — Telephone Encounter (Signed)
Patient did not show for his [facility] Failure [facility] appointment on 11/14/20. Will attempt to reschedule.   

## 2021-07-25 ENCOUNTER — Emergency Department: Payer: Medicaid Other

## 2021-07-25 ENCOUNTER — Inpatient Hospital Stay
Admission: EM | Admit: 2021-07-25 | Discharge: 2021-07-27 | DRG: 291 | Disposition: A | Discharge status: Home / Self Care | Payer: Medicaid Other | Attending: Internal Medicine | Admitting: Internal Medicine

## 2021-07-25 ENCOUNTER — Other Ambulatory Visit: Payer: Self-pay

## 2021-07-25 DIAGNOSIS — Z9981 Dependence on supplemental oxygen: Secondary | ICD-10-CM | POA: Diagnosis not present

## 2021-07-25 DIAGNOSIS — Z955 Presence of coronary angioplasty implant and graft: Secondary | ICD-10-CM

## 2021-07-25 DIAGNOSIS — J9621 Acute and chronic respiratory failure with hypoxia: Secondary | ICD-10-CM | POA: Diagnosis present

## 2021-07-25 DIAGNOSIS — I2510 Atherosclerotic heart disease of native coronary artery without angina pectoris: Secondary | ICD-10-CM | POA: Diagnosis present

## 2021-07-25 DIAGNOSIS — I110 Hypertensive heart disease with heart failure: Secondary | ICD-10-CM | POA: Diagnosis present

## 2021-07-25 DIAGNOSIS — Z79899 Other long term (current) drug therapy: Secondary | ICD-10-CM

## 2021-07-25 DIAGNOSIS — J9622 Acute and chronic respiratory failure with hypercapnia: Secondary | ICD-10-CM | POA: Diagnosis present

## 2021-07-25 DIAGNOSIS — I509 Heart failure, unspecified: Secondary | ICD-10-CM | POA: Diagnosis present

## 2021-07-25 DIAGNOSIS — R0603 Acute respiratory distress: Secondary | ICD-10-CM | POA: Diagnosis not present

## 2021-07-25 DIAGNOSIS — E8729 Other acidosis: Secondary | ICD-10-CM | POA: Diagnosis present

## 2021-07-25 DIAGNOSIS — Z6841 Body Mass Index (BMI) 40.0 and over, adult: Secondary | ICD-10-CM | POA: Diagnosis not present

## 2021-07-25 DIAGNOSIS — Z7902 Long term (current) use of antithrombotics/antiplatelets: Secondary | ICD-10-CM

## 2021-07-25 DIAGNOSIS — J449 Chronic obstructive pulmonary disease, unspecified: Secondary | ICD-10-CM | POA: Diagnosis present

## 2021-07-25 DIAGNOSIS — E66813 Obesity, class 3: Secondary | ICD-10-CM | POA: Diagnosis present

## 2021-07-25 DIAGNOSIS — I252 Old myocardial infarction: Secondary | ICD-10-CM

## 2021-07-25 DIAGNOSIS — Z8673 Personal history of transient ischemic attack (TIA), and cerebral infarction without residual deficits: Secondary | ICD-10-CM | POA: Diagnosis not present

## 2021-07-25 DIAGNOSIS — J9601 Acute respiratory failure with hypoxia: Secondary | ICD-10-CM | POA: Diagnosis not present

## 2021-07-25 DIAGNOSIS — Z7951 Long term (current) use of inhaled steroids: Secondary | ICD-10-CM

## 2021-07-25 DIAGNOSIS — Z886 Allergy status to analgesic agent status: Secondary | ICD-10-CM

## 2021-07-25 DIAGNOSIS — I255 Ischemic cardiomyopathy: Secondary | ICD-10-CM | POA: Diagnosis present

## 2021-07-25 DIAGNOSIS — I5023 Acute on chronic systolic (congestive) heart failure: Secondary | ICD-10-CM | POA: Diagnosis present

## 2021-07-25 DIAGNOSIS — Z9861 Coronary angioplasty status: Secondary | ICD-10-CM | POA: Diagnosis not present

## 2021-07-25 DIAGNOSIS — F32A Depression, unspecified: Secondary | ICD-10-CM | POA: Diagnosis present

## 2021-07-25 DIAGNOSIS — J810 Acute pulmonary edema: Secondary | ICD-10-CM

## 2021-07-25 DIAGNOSIS — F17200 Nicotine dependence, unspecified, uncomplicated: Secondary | ICD-10-CM | POA: Diagnosis present

## 2021-07-25 DIAGNOSIS — Z888 Allergy status to other drugs, medicaments and biological substances status: Secondary | ICD-10-CM | POA: Diagnosis not present

## 2021-07-25 DIAGNOSIS — J9602 Acute respiratory failure with hypercapnia: Secondary | ICD-10-CM | POA: Diagnosis not present

## 2021-07-25 LAB — BLOOD GAS, VENOUS
Acid-Base Excess: 10.4 mmol/L — ABNORMAL HIGH (ref 0.0–2.0)
Acid-Base Excess: 14 mmol/L — ABNORMAL HIGH (ref 0.0–2.0)
Acid-Base Excess: 14.1 mmol/L — ABNORMAL HIGH (ref 0.0–2.0)
Bicarbonate: 40.3 mmol/L — ABNORMAL HIGH (ref 20.0–28.0)
Bicarbonate: 45 mmol/L — ABNORMAL HIGH (ref 20.0–28.0)
Bicarbonate: 45.2 mmol/L — ABNORMAL HIGH (ref 20.0–28.0)
Delivery systems: POSITIVE
Delivery systems: POSITIVE
FIO2: 30 %
Mechanical Rate: 10
O2 Content: 15 L/min
O2 Saturation: 36.4 %
O2 Saturation: 50.8 %
O2 Saturation: 66.9 %
PEEP: 8 cmH2O
Patient temperature: 37
Patient temperature: 37
Patient temperature: 37
pCO2, Ven: 105 mmHg (ref 44–60)
pCO2, Ven: 82 mmHg (ref 44–60)
pCO2, Ven: 94 mmHg (ref 44–60)
pH, Ven: 7.24 — ABNORMAL LOW (ref 7.25–7.43)
pH, Ven: 7.3 (ref 7.25–7.43)
pH, Ven: 7.29 (ref 7.25–7.43)
pO2, Ven: 33 mmHg (ref 32–45)
pO2, Ven: <31 mmHg — CL (ref 32–45)
pO2, Ven: 43 mmHg (ref 32–45)

## 2021-07-25 LAB — CBC WITH DIFFERENTIAL/PLATELET
Abs Immature Granulocytes: 0.06 10*3/uL (ref 0.00–0.07)
Basophils Absolute: 0 10*3/uL (ref 0.0–0.1)
Basophils Relative: 0 %
Eosinophils Absolute: 0.4 10*3/uL (ref 0.0–0.5)
Eosinophils Relative: 3 %
HCT: 32.8 % — ABNORMAL LOW (ref 39.0–52.0)
Hemoglobin: 10.1 g/dL — ABNORMAL LOW (ref 13.0–17.0)
Immature Granulocytes: 1 %
Lymphocytes Relative: 8 %
Lymphs Abs: 1 10*3/uL (ref 0.7–4.0)
MCH: 33.3 pg (ref 26.0–34.0)
MCHC: 30.8 g/dL (ref 30.0–36.0)
MCV: 108.3 fL — ABNORMAL HIGH (ref 80.0–100.0)
Monocytes Absolute: 0.6 10*3/uL (ref 0.1–1.0)
Monocytes Relative: 5 %
Neutro Abs: 10.3 10*3/uL — ABNORMAL HIGH (ref 1.7–7.7)
Neutrophils Relative %: 83 %
Platelets: 241 10*3/uL (ref 150–400)
RBC: 3.03 MIL/uL — ABNORMAL LOW (ref 4.22–5.81)
RDW: 14.1 % (ref 11.5–15.5)
WBC: 12.3 10*3/uL — ABNORMAL HIGH (ref 4.0–10.5)
nRBC: 0 % (ref 0.0–0.2)

## 2021-07-25 LAB — COMPREHENSIVE METABOLIC PANEL
ALT: 37 U/L (ref 0–44)
AST: 26 U/L (ref 15–41)
Albumin: 3.3 g/dL — ABNORMAL LOW (ref 3.5–5.0)
Alkaline Phosphatase: 79 U/L (ref 38–126)
Anion gap: 5 (ref 5–15)
BUN: 9 mg/dL (ref 6–20)
CO2: 38 mmol/L — ABNORMAL HIGH (ref 22–32)
Calcium: 8.8 mg/dL — ABNORMAL LOW (ref 8.9–10.3)
Chloride: 96 mmol/L — ABNORMAL LOW (ref 98–111)
Creatinine, Ser: 1.34 mg/dL — ABNORMAL HIGH (ref 0.61–1.24)
GFR, Estimated: >60 mL/min (ref >60)
Glucose, Bld: 145 mg/dL — ABNORMAL HIGH (ref 70–99)
Potassium: 4.2 mmol/L (ref 3.5–5.1)
Sodium: 139 mmol/L (ref 135–145)
Total Bilirubin: 0.5 mg/dL (ref 0.3–1.2)
Total Protein: 6.6 g/dL (ref 6.5–8.1)

## 2021-07-25 LAB — BRAIN NATRIURETIC PEPTIDE: B Natriuretic Peptide: 377.5 pg/mL — ABNORMAL HIGH (ref 0.0–100.0)

## 2021-07-25 LAB — TROPONIN I (HIGH SENSITIVITY)
Troponin I (High Sensitivity): 20 ng/L — ABNORMAL HIGH (ref <18)
Troponin I (High Sensitivity): 17 ng/L (ref <18)

## 2021-07-25 MED ORDER — SODIUM CHLORIDE 0.9% FLUSH
3.0000 mL | Freq: Two times a day (BID) | INTRAVENOUS | Status: DC
  Administered 2021-07-25 – 2021-07-27 (×5): 3 mL via INTRAVENOUS

## 2021-07-25 MED ORDER — FUROSEMIDE 10 MG/ML IJ SOLN
40.0000 mg | Freq: Once | INTRAMUSCULAR | Status: AC
  Administered 2021-07-25: 40 mg via INTRAVENOUS
  Filled 2021-07-25: qty 4

## 2021-07-25 MED ORDER — THIAMINE HCL 100 MG PO TABS
100.0000 mg | ORAL_TABLET | Freq: Every day | ORAL | Status: DC
  Administered 2021-07-26 – 2021-07-27 (×2): 100 mg via ORAL
  Filled 2021-07-25 (×2): qty 1

## 2021-07-25 MED ORDER — MOMETASONE FURO-FORMOTEROL FUM 200-5 MCG/ACT IN AERO
2.0000 | INHALATION_SPRAY | Freq: Two times a day (BID) | RESPIRATORY_TRACT | Status: DC
  Administered 2021-07-25 – 2021-07-27 (×4): 2 via RESPIRATORY_TRACT
  Filled 2021-07-25 (×2): qty 8.8

## 2021-07-25 MED ORDER — ENOXAPARIN SODIUM 80 MG/0.8ML IJ SOSY
70.0000 mg | PREFILLED_SYRINGE | INTRAMUSCULAR | Status: DC
  Administered 2021-07-25 – 2021-07-26 (×2): 70 mg via SUBCUTANEOUS
  Filled 2021-07-25: qty 0.7
  Filled 2021-07-25: qty 0.8

## 2021-07-25 MED ORDER — TAMSULOSIN HCL 0.4 MG PO CAPS
0.4000 mg | ORAL_CAPSULE | Freq: Every day | ORAL | Status: DC
  Administered 2021-07-26 – 2021-07-27 (×2): 0.4 mg via ORAL
  Filled 2021-07-25 (×2): qty 1

## 2021-07-25 MED ORDER — UMECLIDINIUM BROMIDE 62.5 MCG/ACT IN AEPB
1.0000 | INHALATION_SPRAY | Freq: Every day | RESPIRATORY_TRACT | Status: DC
  Filled 2021-07-25: qty 7

## 2021-07-25 MED ORDER — IPRATROPIUM-ALBUTEROL 0.5-2.5 (3) MG/3ML IN SOLN
3.0000 mL | Freq: Once | RESPIRATORY_TRACT | Status: AC
  Administered 2021-07-25: 3 mL via RESPIRATORY_TRACT
  Filled 2021-07-25: qty 3

## 2021-07-25 MED ORDER — TIOTROPIUM BROMIDE MONOHYDRATE 2.5 MCG/ACT IN AERS: 2.0000 | INHALATION_SPRAY | Freq: Every day | RESPIRATORY_TRACT | Status: DC

## 2021-07-25 MED ORDER — CLOPIDOGREL BISULFATE 75 MG PO TABS
75.0000 mg | ORAL_TABLET | Freq: Every day | ORAL | Status: DC
  Administered 2021-07-26 – 2021-07-27 (×2): 75 mg via ORAL
  Filled 2021-07-25 (×2): qty 1

## 2021-07-25 MED ORDER — ONDANSETRON HCL 4 MG/2ML IJ SOLN: 4.0000 mg | Freq: Four times a day (QID) | INTRAMUSCULAR | Status: DC | PRN

## 2021-07-25 MED ORDER — METHYLPREDNISOLONE SODIUM SUCC 125 MG IJ SOLR
125.0000 mg | Freq: Once | INTRAMUSCULAR | Status: AC
  Administered 2021-07-25: 125 mg via INTRAVENOUS
  Filled 2021-07-25: qty 2

## 2021-07-25 MED ORDER — ONDANSETRON HCL 4 MG PO TABS: 4.0000 mg | ORAL_TABLET | Freq: Four times a day (QID) | ORAL | Status: DC | PRN

## 2021-07-25 MED ORDER — ALBUTEROL SULFATE (2.5 MG/3ML) 0.083% IN NEBU
2.5000 mg | INHALATION_SOLUTION | Freq: Four times a day (QID) | RESPIRATORY_TRACT | Status: DC | PRN
  Administered 2021-07-25: 2.5 mg via RESPIRATORY_TRACT
  Filled 2021-07-25: qty 3

## 2021-07-25 MED ORDER — MAGNESIUM OXIDE 400 MG PO TABS
400.0000 mg | ORAL_TABLET | Freq: Every day | ORAL | Status: DC
  Administered 2021-07-26 – 2021-07-27 (×2): 400 mg via ORAL
  Filled 2021-07-25 (×5): qty 1

## 2021-07-25 MED ORDER — FUROSEMIDE 10 MG/ML IJ SOLN
40.0000 mg | Freq: Every day | INTRAMUSCULAR | Status: DC
  Administered 2021-07-26 – 2021-07-27 (×2): 40 mg via INTRAVENOUS
  Filled 2021-07-25 (×2): qty 4

## 2021-07-25 MED ORDER — SODIUM CHLORIDE 0.9% FLUSH: 3.0000 mL | INTRAVENOUS | Status: DC | PRN

## 2021-07-25 MED ORDER — GABAPENTIN 300 MG PO CAPS
300.0000 mg | ORAL_CAPSULE | Freq: Three times a day (TID) | ORAL | Status: DC
  Administered 2021-07-25 – 2021-07-27 (×6): 300 mg via ORAL
  Filled 2021-07-25 (×6): qty 1

## 2021-07-25 MED ORDER — UMECLIDINIUM BROMIDE 62.5 MCG/ACT IN AEPB
1.0000 | INHALATION_SPRAY | Freq: Every day | RESPIRATORY_TRACT | Status: DC
  Administered 2021-07-26 – 2021-07-27 (×2): 1 via RESPIRATORY_TRACT
  Filled 2021-07-25 (×2): qty 7

## 2021-07-25 MED ORDER — BUPROPION HCL ER (XL) 150 MG PO TB24
300.0000 mg | ORAL_TABLET | Freq: Every day | ORAL | Status: DC
  Administered 2021-07-26 – 2021-07-27 (×2): 300 mg via ORAL
  Filled 2021-07-25 (×3): qty 2

## 2021-07-25 MED ORDER — ATORVASTATIN CALCIUM 80 MG PO TABS
80.0000 mg | ORAL_TABLET | Freq: Every morning | ORAL | Status: DC
  Administered 2021-07-26 – 2021-07-27 (×2): 80 mg via ORAL
  Filled 2021-07-25 (×2): qty 1

## 2021-07-25 MED ORDER — NICOTINE 21 MG/24HR TD PT24
21.0000 mg | MEDICATED_PATCH | Freq: Every day | TRANSDERMAL | Status: DC
Start: 2021-07-25 — End: 2021-07-27
  Administered 2021-07-26 – 2021-07-27 (×2): 21 mg via TRANSDERMAL
  Filled 2021-07-25 (×2): qty 1

## 2021-07-25 MED ORDER — MOMETASONE FURO-FORMOTEROL FUM 200-5 MCG/ACT IN AERO
2.0000 | INHALATION_SPRAY | Freq: Two times a day (BID) | RESPIRATORY_TRACT | Status: DC
  Filled 2021-07-25: qty 8.8

## 2021-07-25 MED ORDER — CARVEDILOL 3.125 MG PO TABS
3.1250 mg | ORAL_TABLET | Freq: Two times a day (BID) | ORAL | Status: DC
  Administered 2021-07-25 – 2021-07-27 (×4): 3.125 mg via ORAL
  Filled 2021-07-25 (×4): qty 1

## 2021-07-25 MED ORDER — SODIUM CHLORIDE 0.9 % IV SOLN: 250.0000 mL | INTRAVENOUS | Status: DC | PRN

## 2021-07-25 MED ORDER — IPRATROPIUM-ALBUTEROL 0.5-2.5 (3) MG/3ML IN SOLN
RESPIRATORY_TRACT | Status: AC
  Filled 2021-07-25: qty 3

## 2021-07-25 MED ORDER — FOLIC ACID 1 MG PO TABS
1.0000 mg | ORAL_TABLET | Freq: Every day | ORAL | Status: DC
  Administered 2021-07-26 – 2021-07-27 (×2): 1 mg via ORAL
  Filled 2021-07-25 (×2): qty 1

## 2021-07-25 NOTE — Assessment & Plan Note (Addendum)
Continue nicotine transdermal patch 21 mg daily.

## 2021-07-25 NOTE — Assessment & Plan Note (Addendum)
Patient's last known LVEF is 35 to 40% with decreased LV function and left ventricular regional wall motion abnormality from a 2D echocardiogram which was done 09/22. Admitted for worsening shortness of breath from his baseline associated with bilateral lower extremity swelling and hypoxia Imaging shows mild diffuse interstitial opacities, suggestive of interstitial edema. BNP is elevated Continue Lasix 40 mg IV daily Monitor renal function closely since he developed AKI during his last hospitalization with peak serum creatinine at 5 from a baseline of 1.0  Today on admission his serum creatinine is 1.34-> 1.1 Continue to hold spironolactone and Entresto Continue carvedilol  cardiology input appreciated Net IO Since Admission: -1,385 mL [07/26/21 1232]

## 2021-07-25 NOTE — ED Notes (Signed)
VBG results given to admitting MD

## 2021-07-25 NOTE — Assessment & Plan Note (Addendum)
Continue bupropion.   

## 2021-07-25 NOTE — ED Provider Notes (Signed)
[facility] Provider Note   Event Date/Time   First MD Initiated Contact with Patient 07/25/21 0730     (approximate) History  Respiratory Distress  HPI Tommy Gaines is a 56 y.o. male  Location: Chest Duration: 1 day prior to arrival Timing: Worsening since onset Severity: Severe Quality: Shortness of breath Context: Patient with history of CHF and COPD who presents via EMS after recent discharge from outside hospital complaining of shortness of breath that has been worsening since discharge after patient was supposed to have a CPAP machine on arrival and has not had it delivered yet Modifying factors: Worsened with exertion and partially relieved at rest Associated Symptoms: Chest tightness, bilateral lower extremity edema ROS: Patient currently denies any vision changes, tinnitus, difficulty speaking, facial droop, sore throat, abdominal pain, nausea/vomiting/diarrhea, dysuria, or weakness/numbness/paresthesias in any extremity   Physical Exam  Triage Vital Signs: ED Triage Vitals  Enc Vitals Group     BP 07/25/21 0732 (!) 121/59     Pulse Rate 07/25/21 0732 94     Resp 07/25/21 0732 20     Temp 07/25/21 0732 98.1 F (36.7 C)     Temp Source 07/25/21 0732 Axillary     SpO2 07/25/21 0732 100 %     Weight 07/25/21 0740 300 lb (136.1 kg)     Height 07/25/21 0740 6\' 1"  (1.854 m)     Head Circumference --      Peak Flow --      Pain Score 07/25/21 0739 3     Pain Loc --      Pain Edu? --      Excl. in GC? --    Most recent vital signs: Vitals:   07/25/21 0732 07/25/21 0735  BP: (!) 121/59   Pulse: 94 88  Resp: 20 (!) 24  Temp: 98.1 F (36.7 C)   SpO2: 100% 98%   General: Awake, oriented x4. CV:  Good peripheral perfusion.  Resp:  Increased effort.  Expiratory wheezes bilaterally Abd:  No distention.  Other:  Morbidly obese middle-aged Caucasian male laying in bed with BiPAP in place and mild respiratory distress ED Results /  Procedures / Treatments  Labs (all labs ordered are listed, but only abnormal results are displayed) Labs Reviewed  BLOOD GAS, VENOUS - Abnormal; Notable for the following components:      Result Value   pH, Ven 7.24 (*)    pCO2, Ven 105 (*)    pO2, Ven <31 (*)    Bicarbonate 45.0 (*)    Acid-Base Excess 14.0 (*)    All other components within normal limits  BRAIN NATRIURETIC PEPTIDE - Abnormal; Notable for the following components:   B Natriuretic Peptide 377.5 (*)    All other components within normal limits  COMPREHENSIVE METABOLIC PANEL - Abnormal; Notable for the following components:   Chloride 96 (*)    CO2 38 (*)    Glucose, Bld 145 (*)    Creatinine, Ser 1.34 (*)    Calcium 8.8 (*)    Albumin 3.3 (*)    All other components within normal limits  CBC WITH DIFFERENTIAL/PLATELET - Abnormal; Notable for the following components:   WBC 12.3 (*)    RBC 3.03 (*)    Hemoglobin 10.1 (*)    HCT 32.8 (*)    MCV 108.3 (*)    Neutro Abs 10.3 (*)    All other components within normal limits  TROPONIN I (HIGH SENSITIVITY) - Abnormal; Notable  for the following components:   Troponin I (High Sensitivity) 20 (*)    All other components within normal limits  BLOOD GAS, VENOUS  BLOOD GAS, VENOUS   EKG ED ECG REPORT I, Merwyn Katos, the attending physician, personally viewed and interpreted this ECG. Date: 07/25/2021 EKG Time: 0745 Rate: 89 Rhythm: normal sinus rhythm QRS Axis: normal Intervals: normal ST/T Wave abnormalities: normal Narrative Interpretation: no evidence of acute ischemia RADIOLOGY ED MD interpretation: Single view portable chest x-ray interpreted by me and shows mild diffuse interstitial opacities, cardiomegaly, and pulmonary vascular congestion consistent with interstitial edema -Agree with radiology assessment Official radiology report(s): DG Chest Port 1 View  Result Date: 07/25/2021 CLINICAL DATA:  SHOB, hx of COPD and CHF EXAM: PORTABLE CHEST 1 VIEW  COMPARISON:  None Available. FINDINGS: Mild diffuse interstitial opacities. No confluent consolidation. No visible pleural effusions or pneumothorax. Enlarged cardiac silhouette. Pulmonary vascular congestion. Partially imaged right reverse total shoulder arthroplasty. IMPRESSION: 1. Mild diffuse interstitial opacities, suggestive of interstitial edema. 2. Cardiomegaly and pulmonary vascular congestion. Electronically Signed   By: Feliberto Harts M.D.   On: 07/25/2021 08:34   PROCEDURES: Critical Care performed: Yes, see critical care procedure note(s) .1-3 Lead EKG Interpretation Performed by: Merwyn Katos, MD Authorized by: Merwyn Katos, MD     Interpretation: normal     ECG rate:  89   ECG rate assessment: normal     Rhythm: sinus rhythm     Ectopy: none     Conduction: normal   CRITICAL CARE Performed by: Merwyn Katos  Total critical care time: 35 minutes  Critical care time was exclusive of separately billable procedures and treating other patients.  Critical care was necessary to treat or prevent imminent or life-threatening deterioration.  Critical care was time spent personally by me on the following activities: development of treatment plan with patient and/or surrogate as well as nursing, discussions with consultants, evaluation of patient's response to treatment, examination of patient, obtaining history from patient or surrogate, ordering and performing treatments and interventions, ordering and review of laboratory studies, ordering and review of radiographic studies, pulse oximetry and re-evaluation of patient's condition.  MEDICATIONS ORDERED IN ED: Medications  ipratropium-albuterol (DUONEB) 0.5-2.5 (3) MG/3ML nebulizer solution (has no administration in time range)  ipratropium-albuterol (DUONEB) 0.5-2.5 (3) MG/3ML nebulizer solution 3 mL (3 mLs Nebulization Given 07/25/21 0757)  methylPREDNISolone sodium succinate (SOLU-MEDROL) 125 mg/2 mL injection 125 mg (125  mg Intravenous Given 07/25/21 0757)  furosemide (LASIX) injection 40 mg (40 mg Intravenous Given 07/25/21 0757)   IMPRESSION / MDM / ASSESSMENT AND PLAN / ED COURSE  I reviewed the triage vital signs and the nursing notes.                             The patient is on the cardiac monitor to evaluate for evidence of arrhythmia and/or significant heart rate changes. Patient's presentation is most consistent with severe exacerbation of chronic illness. The patient appears to be suffering from a moderate/severe exacerbation of COPD.  Based on the history, exam, CXR/EKG reviewed by me, and further workup I dont suspect any other emergent cause of this presentation, such as pneumonia, acute coronary syndrome, congestive heart failure, pulmonary embolism, or pneumothorax.  ED Interventions: bronchodilators, steroids, antibiotics, reassess  Reassessment: After treatment, the patients shortness of breath is improving but patient is still requiring supplemental oxygenation with BiPAP  Disposition: Admit   FINAL CLINICAL  IMPRESSION(S) / ED DIAGNOSES   Final diagnoses:  Acute respiratory failure with hypoxia and hypercapnia (HCC)  Acute pulmonary edema (HCC)  Respiratory distress   Rx / DC Orders   ED Discharge Orders     None      Note:  This document was prepared using Dragon voice recognition software and may include unintentional dictation errors.   Merwyn KatosBradler, Khaidyn Staebell K, MD 07/25/21 [phone]

## 2021-07-25 NOTE — Assessment & Plan Note (Addendum)
Patient has a history of chronic respiratory failure and is on 2 L of oxygen continuous He presents for evaluation of worsening shortness of breath from his baseline associated with increased work of breathing and hypoxia Venous blood gas showed uncompensated respiratory acidosis with hypercapnia Patient initially required BiPAP with improvement in his repeat venous blood gas Lab review shows he has chronic hypercapnia due to elevated bicarbonate level Continue BiPAP as needed Patient has been encouraged to get the CPAP that was ordered while he was at [facility] and use as recommended.  His CPAP was scheduled to be delivered tomorrow according to patient Maintain pulse oximetry greater than 92%

## 2021-07-25 NOTE — ED Notes (Signed)
Patient is ready for transport.  

## 2021-07-25 NOTE — H&P (Addendum)
History and Physical    Patient: Tommy Gaines ACZ:[id number] DOB: 09/04/1964 DOA: 07/25/2021 DOS: the patient was seen and examined on 07/25/2021 PCP: Pcp, No  Patient coming from: Home  Chief Complaint:  Chief Complaint  Patient presents with   Respiratory Distress   Most of the history was obtained from patient's girlfriend at the bedside. HPI: Tommy Gaines is a 56 y.o. male with medical history significant for CAD with history of NSTEMI in 1093, complicated by acute systolic heart failure and cardiogenic shock with last known LVEF of 35 to 40% with decreased LV function and left ventricular regional wall motion abnormality (09/22), status post stent angioplasty with multiple stents including the LAD, COPD with history of respiratory failure requiring mechanical intubation, currently on 2 L of oxygen continuous,class III obesity, HTN, CVA, who presents to the emergency room in respiratory distress with EMS recording O2 sat in the 80s.  Patient is currently on BiPAP due to increased work of breathing and arterial blood gas which showed uncompensated respiratory acidosis. Patient was recently discharged from [facility] after he was hospitalized for 2 weeks.  Per chart review his hospitalization was complicated by acute kidney injury from diuresis and patient spironolactone and Entresto was discontinued.  He was discharged home on home oxygen at 2 L and during that hospitalization pulmonology was consulted and they recommended to start AVAPS in place of home BiPAP (qualifying ABG obtained) and Life 2000 portable non-invasive ventilator for use during the day to improve exercise tolerance. He will continue Spiriva and Symbicort and follow up with [facility] Pulmonology for further discussions of pulmonary rehab.  Per patient's girlfriend he declined the AVAPS upon discharge but she has called the company to ask them to deliver it. She states that patient had done well since his  discharge but has not been very mobile.  Home health PT was ordered and they are scheduled to come out on 07/25/21. She states that patient developed worsening shortness of breath 1 day prior to his admission and asked that his oxygen level be increased from 2 to 3 L which she declined due to concerns of worsening his hypercapnia.  Overnight his shortness of breath got worse and she states that at rest on room air his pulse oximetry was 81% and is improved with his oxygen at 2 to 3 L to 92%.  Due to worsening symptoms she called EMS and patient was transported to the ER on BiPAP.  He complains of chest tightness and has bilateral lower extremity swelling. He denies having any fever, no chills, no cough, no headache, no abdominal pain, no nausea, no vomiting, no dizziness, no lightheadedness, no urinary symptoms or changes in his bowel habits.   Review of Systems: As mentioned in the history of present illness. All other systems reviewed and are negative. History reviewed. No pertinent past medical history. History reviewed. No pertinent surgical history. Social History:  has no history on file for tobacco use, alcohol use, and drug use.  Allergies  Allergen Reactions   Tylenol [Acetaminophen] Hives    History reviewed. No pertinent family history.  Prior to Admission medications   Medication Sig Start Date End Date Taking? Authorizing Provider  atorvastatin (LIPITOR) 80 MG tablet Take 80 mg by mouth every morning. 07/12/21   [provider]  bumetanide (BUMEX) 2 MG tablet Take by mouth. 07/05/21   [provider]  buPROPion (WELLBUTRIN XL) 300 MG 24 hr tablet Take 300 mg by mouth daily. 07/09/21  [provider]  carvedilol (COREG) 3.125 MG tablet Take 3.125 mg by mouth 2 (two) times daily. 07/17/21   [provider]  clopidogrel (PLAVIX) 75 MG tablet Take 75 mg by mouth daily. 07/12/21   [provider]  ENTRESTO 24-26 MG Take 1 tablet by mouth 2  (two) times daily. 07/05/21   [provider]  folic acid (FOLVITE) 1 MG tablet Take 1 mg by mouth daily. 07/03/21   [provider]  furosemide (LASIX) 40 MG tablet Take 80 mg by mouth daily. 04/15/21   [provider]  gabapentin (NEURONTIN) 300 MG capsule Take 300 mg by mouth 3 (three) times daily. 05/30/21   [provider]  magnesium oxide (MAG-OX) 400 MG tablet Take 1 tablet by mouth daily. 06/15/21   [provider]  nicotine (NICODERM CQ - DOSED IN MG/24 HOURS) 21 mg/24hr patch 21 mg daily. 07/09/21   [provider]  oxyCODONE (OXY IR/ROXICODONE) 5 MG immediate release tablet Take 10 mg by mouth every 8 (eight) hours as needed. 07/17/21   [provider]  SPIRIVA RESPIMAT 2.5 MCG/ACT AERS Inhale 2 puffs into the lungs daily. 07/20/21   [provider]  spironolactone (ALDACTONE) 25 MG tablet Take 25 mg by mouth every morning. 06/26/21   [provider]  SYMBICORT 160-4.5 MCG/ACT inhaler Inhale into the lungs. 07/20/21   [provider]  tamsulosin (FLOMAX) 0.4 MG CAPS capsule Take 0.4 mg by mouth daily. 07/17/21   [provider]  thiamine 100 MG tablet Take 100 mg by mouth daily. 07/19/21   [provider]  VENTOLIN HFA 108 (90 Base) MCG/ACT inhaler Inhale 2 puffs into the lungs every 6 (six) hours as needed. 06/01/21   [provider]    Physical Exam: Vitals:   07/25/21 0732 07/25/21 0735 07/25/21 0740  BP: (!) 121/59    Pulse: 94 88   Resp: 20 (!) 24   Temp: 98.1 F (36.7 C)    TempSrc: Axillary    SpO2: 100% 98%   Weight:   136.1 kg  Height:   '6\' 1"'  (1.854 m)   Physical Exam Vitals and nursing note reviewed.  Constitutional:      Appearance: He is obese.     Comments: On Bipap  HENT:     Head: Normocephalic and atraumatic.     Nose: Nose normal.     Mouth/Throat:     Mouth: Mucous membranes are moist.  Eyes:     Pupils: Pupils are equal, round, and reactive to  light.  Cardiovascular:     Rate and Rhythm: Normal rate and regular rhythm.  Pulmonary:     Breath sounds: Rales present.     Comments: Increased work of breathing Abdominal:     General: Bowel sounds are normal.     Palpations: Abdomen is soft.     Comments: Central adiposity  Musculoskeletal:        General: Normal range of motion.     Cervical back: Normal range of motion and neck supple.     Right lower leg: Edema present.     Left lower leg: Edema present.  Skin:    General: Skin is warm and dry.  Neurological:     Mental Status: He is oriented to person, place, and time.  Psychiatric:        Mood and Affect: Mood normal.        Behavior: Behavior normal.   Data Reviewed: Relevant notes from primary  care and specialist visits, past discharge summaries as available in EHR, including Care Everywhere. Prior diagnostic testing as pertinent to current admission diagnoses Updated medications and problem lists for reconciliation ED course, including vitals, labs, imaging, treatment and response to treatment Triage notes, nursing and pharmacy notes and ED provider's notes Notable results as noted in HPI Labs reviewed.  BNP 377, troponin 20, sodium 139, potassium 4.2, chloride 96, bicarb 38, glucose 145, BUN 9, creatinine 1.34, calcium 8.8, total protein 6.6, albumin 3.3, AST 26, ALT 37, alk phos 79, total bili 0.5, white count 12.3, hemoglobin 10.1, hematocrit 32.8, MCV 108.3, platelet count 241 VBG 7.24/105/less than 31/45 Chest x-ray reviewed by me shows mild diffuse interstitial opacities, suggestive of interstitial edema. Cardiomegaly and pulmonary vascular congestion. Twelve-lead EKG reviewed by me shows sinus rhythm.  Low voltage QRS with right axis deviation. There are no new results to review at this time.  Assessment and Plan: * Acute on chronic systolic CHF (congestive heart failure) (HCC) Patient's last known LVEF is 35 to 40% with decreased LV function and left  ventricular regional wall motion abnormality from a 2D echocardiogram which was done 09/22. He presents to the ER for evaluation of worsening shortness of breath from his baseline associated with bilateral lower extremity swelling and hypoxia Imaging shows mild diffuse interstitial opacities, suggestive of interstitial edema. BNP is elevated We will place patient on Lasix 40 mg IV daily Monitor renal function closely since he developed AKI during his last hospitalization with peak serum creatinine at 5 from a baseline of 1.0  Today on admission his serum creatinine is 1.34 Continue to hold spironolactone and Entresto Continue carvedilol We will consult cardiology   Acute on chronic respiratory failure with hypoxia and hypercapnia ([place]) Patient has a history of chronic respiratory failure and is on 2 L of oxygen continuous He presents for evaluation of worsening shortness of breath from his baseline associated with increased work of breathing and hypoxia Venous blood gas showed uncompensated respiratory acidosis with hypercapnia Patient has been on BiPAP with improvement in his repeat venous blood gas Lab review shows he has chronic hypercapnia due to elevated bicarbonate level Continue BiPAP Patient has been encouraged to get the AVAPS that was ordered while he was at [facility] and use as recommended Maintain pulse oximetry greater than 92%  Obesity, Class III, BMI 40-49.9 (morbid obesity) ([place]) Patient has a BMI of 16.10 kg/m2 Complicates overall prognosis and care Lifestyle modification and exercise has been discussed with patient in detail  Depression Continue bupropion  CAD S/P percutaneous coronary angioplasty Patient has a history of coronary artery disease status post PCI with stent angioplasty with ischemic cardiomyopathy. Last known LVEF is 35 to 40% from a 2D echocardiogram which was done 09/22 Continue atorvastatin, Plavix and carvedilol  Nicotine  dependence Continue nicotine transdermal patch 21 mg daily      Advance Care Planning:   Code Status: Full Code   Consults: Cardiology  Family Communication: Greater than 50% of time was spent discussing patient's condition and plan of care with him at the bedside.  All questions and concerns have been addressed.  He verbalizes understanding and agrees with the plan.  Severity of Illness: The appropriate patient status for this patient is INPATIENT. Inpatient status is judged to be reasonable and necessary in order to provide the required intensity of service to ensure the patient's safety. The patient's presenting symptoms, physical exam findings, and initial radiographic and laboratory data in the context of their  chronic comorbidities is felt to place them at high risk for further clinical deterioration. Furthermore, it is not anticipated that the patient will be medically stable for discharge from the hospital within 2 midnights of admission.   * I certify that at the point of admission it is my clinical judgment that the patient will require inpatient hospital care spanning beyond 2 midnights from the point of admission due to high intensity of service, high risk for further deterioration and high frequency of surveillance required.*  Author: Collier Bullock, MD 07/25/2021 11:09 AM  For on call review [email or web address].

## 2021-07-25 NOTE — ED Triage Notes (Addendum)
Pt presents to ED via [facility] with c/o of resp distress. Pt on c-pap for EMS.  Pt placed on bi-pap with RT at bedside on arrival.   Pt states he was recently seen and admitted and D/C'ed from [facility] and was told he needed a c-pap but was discharged without one and states he felt fine when he left [facility] but states he steadily got worse without the c-pap. Pt denies any new fevers or chills.Pt states HX of COPD and does states he smokes daily and also reports he drinks alcohol daily and endorses "1-2 cups of wine a day".   Pt denies any CP at this time.   Pt tolerating bi-pap well at this time.

## 2021-07-25 NOTE — Consult Note (Signed)
[facility]  Patient ID: Tommy Gaines, MRN: [id number], DOB/AGE: 58/17/1966 58 y.o. Admit date: 07/25/2021   Date of Consult: 07/25/2021 Primary Physician: Pcp, No Primary Cardiologist: [place]  Chief Complaint:  Chief Complaint  Patient presents with   Respiratory Distress   Reason for Consult:  Heart failure  HPI: 56 y.o. male with known chronic systolic dysfunction congestive heart failure with coronary artery disease and multiple stents as well as severe COPD and low oxygen levels requiring 2 L of constant oxygen.  The patient claims that he is had new onset significant lower extremity edema shortness of breath and weakness.  The patient is very obese and has sleep apnea as well.  When arriving to the emergency room the patient did have some hypoxia for which is requiring BiPAP.  He does have better oxygenation and is able to do more discussion at this time.  There is been no evidence of chest pain.  Troponin is normal BNP is 377 and chest x-ray showed pulmonary edema.  EKG shows normal sinus rhythm left axis deviation with poor R wave progression.  The patient does have some slight improvement with diuresis at this time.  History reviewed. No pertinent past medical history.    Surgical History: History reviewed. No pertinent surgical history.   Home Meds: Prior to Admission medications   Medication Sig Start Date End Date Taking? Authorizing Provider  atorvastatin (LIPITOR) 80 MG tablet Take 80 mg by mouth every morning. 07/12/21  Yes [provider]  bumetanide (BUMEX) 2 MG tablet Take 2 mg by mouth 2 (two) times daily. 1 TABLET (2 MG TOTAL) BY MOUTH DAILY AND 0.5 TABLETS (1 MG TOTAL) DAILY WITH LUNCH 07/05/21  Yes [provider]  buPROPion (WELLBUTRIN XL) 300 MG 24 hr tablet Take 300 mg by mouth daily. 07/09/21  Yes [provider]  carvedilol (COREG) 3.125 MG tablet Take 3.125 mg by mouth 2 (two) times daily. 07/17/21   Yes [provider]  folic acid (FOLVITE) 1 MG tablet Take 1 mg by mouth daily. 07/03/21  Yes [provider]  furosemide (LASIX) 40 MG tablet Take 80 mg by mouth daily. 04/15/21  Yes [provider]  gabapentin (NEURONTIN) 300 MG capsule Take 300 mg by mouth 3 (three) times daily. 05/30/21  Yes [provider]  magnesium oxide (MAG-OX) 400 MG tablet Take 1 tablet by mouth daily. 06/15/21  Yes [provider]  nicotine (NICODERM CQ - DOSED IN MG/24 HOURS) 21 mg/24hr patch 21 mg daily. 07/09/21  Yes [provider]  oxyCODONE (OXY IR/ROXICODONE) 5 MG immediate release tablet Take 10 mg by mouth every 8 (eight) hours as needed. 07/17/21  Yes [provider]  SPIRIVA RESPIMAT 2.5 MCG/ACT AERS Inhale 2 puffs into the lungs daily. 07/20/21  Yes [provider]  SYMBICORT 160-4.5 MCG/ACT inhaler Inhale into the lungs. 07/20/21  Yes [provider]  tamsulosin (FLOMAX) 0.4 MG CAPS capsule Take 0.4 mg by mouth daily. 07/17/21  Yes [provider]  thiamine 100 MG tablet Take 100 mg by mouth daily. 07/19/21  Yes [provider]  VENTOLIN HFA 108 (90 Base) MCG/ACT inhaler Inhale 2 puffs into the lungs every 6 (six) hours as needed. 06/01/21  Yes [provider]  clopidogrel (PLAVIX) 75 MG tablet Take 75 mg by mouth daily. Patient not taking: Reported on 07/25/2021 07/12/21   [provider]  ENTRESTO 24-26 MG Take 1 tablet by mouth 2 (two) times daily.  Patient not taking: Reported on 07/25/2021 07/05/21   [provider]  spironolactone (ALDACTONE) 25 MG tablet Take 25 mg by mouth every morning. Patient not taking: Reported on 07/25/2021 06/26/21   [provider]    Inpatient Medications:   atorvastatin  80 mg Oral q morning   buPROPion  300 mg Oral Daily   carvedilol  3.125 mg Oral BID   clopidogrel  75 mg Oral Daily   enoxaparin (LOVENOX) injection  70 mg Subcutaneous Q24H   folic acid  1  mg Oral Daily   [START ON 07/26/2021] furosemide  40 mg Intravenous Daily   gabapentin  300 mg Oral TID   ipratropium-albuterol       magnesium oxide  400 mg Oral Daily   mometasone-formoterol  2 puff Inhalation BID   nicotine  21 mg Transdermal Daily   sodium chloride flush  3 mL Intravenous Q12H   tamsulosin  0.4 mg Oral Daily   thiamine  100 mg Oral Daily   [START ON 07/26/2021] umeclidinium bromide  1 puff Inhalation Daily    sodium chloride      Allergies:  Allergies  Allergen Reactions   Tylenol [Acetaminophen] Hives    Social History   Socioeconomic History   Marital status: Single    Spouse name: Not on file   Number of children: Not on file   Years of education: Not on file   Highest education level: Not on file  Occupational History   Not on file  Tobacco Use   Smoking status: Not on file   Smokeless tobacco: Not on file  Vaping Use   Vaping Use: Not on file  Substance and Sexual Activity   Alcohol use: Not on file   Drug use: Not on file   Sexual activity: Not on file  Other Topics Concern   Not on file  Social History Narrative   Not on file   Social Determinants of Health   Financial Resource Strain: Not on file  Food Insecurity: Not on file  Transportation Needs: Not on file  Physical Activity: Not on file  Stress: Not on file  Social Connections: Not on file  Intimate Partner Violence: Not on file     History reviewed. No pertinent family history.   Review of Systems Positive for shortness of breath PND orthopnea Negative for: General:  chills, fever, night sweats or weight changes.  Cardiovascular: Positive for PND orthopnea s negative for yncope dizziness  Dermatological skin lesions rashes Respiratory: Cough congestion Urologic: Frequent urination urination at night and hematuria Abdominal: negative for nausea, vomiting, diarrhea, bright red blood per rectum, melena, or hematemesis Neurologic: negative for visual changes, and/or hearing  changes  All other systems reviewed and are otherwise negative except as noted above.  Labs: No results for input(s): CKTOTAL, CKMB, TROPONINI in the last 72 hours. Lab Results  Component Value Date   WBC 12.3 (H) 07/25/2021   HGB 10.1 (L) 07/25/2021   HCT 32.8 (L) 07/25/2021   MCV 108.3 (H) 07/25/2021   PLT 241 07/25/2021    Recent Labs  Lab 07/25/21 0737  NA 139  K 4.2  CL 96*  CO2 38*  BUN 9  CREATININE 1.34*  CALCIUM 8.8*  PROT 6.6  BILITOT 0.5  ALKPHOS 79  ALT 37  AST 26  GLUCOSE 145*   No results found for: CHOL, HDL, LDLCALC, TRIG No results found for: DDIMER  Radiology/Studies:  DG Chest Port 1 View  Result Date: 07/25/2021  CLINICAL DATA:  SHOB, hx of COPD and CHF EXAM: PORTABLE CHEST 1 VIEW COMPARISON:  None Available. FINDINGS: Mild diffuse interstitial opacities. No confluent consolidation. No visible pleural effusions or pneumothorax. Enlarged cardiac silhouette. Pulmonary vascular congestion. Partially imaged right reverse total shoulder arthroplasty. IMPRESSION: 1. Mild diffuse interstitial opacities, suggestive of interstitial edema. 2. Cardiomegaly and pulmonary vascular congestion. Electronically Signed   By: Feliberto Harts M.D.   On: 07/25/2021 08:34    EKG: Normal sinus rhythm left axis deviation poor R wave progression  Weights: Filed Weights   07/25/21 0740  Weight: 136.1 kg     Physical Exam: Blood pressure 130/78, pulse 66, temperature 98.1 F (36.7 C), temperature source Axillary, resp. rate 20, height 6\' 1"  (1.854 m), weight 136.1 kg, SpO2 97 %. Body mass index is 39.58 kg/m. General: Well developed, well nourished, in no acute distress. Head eyes ears nose throat: Normocephalic, atraumatic, sclera non-icteric, no xanthomas, nares are without discharge. No apparent thyromegaly and/or mass  Lungs: Normal respiratory effort.  no wheezes, basilar rales, no rhonchi.  Heart: RRR with normal S1 S2. no murmur gallop, no rub, PMI is normal size  and placement, carotid upstroke normal without bruit, jugular venous pressure is normal Abdomen: Soft, non-tender,  distended with normoactive bowel sounds. No hepatomegaly. No rebound/guarding. No obvious abdominal masses. Abdominal aorta is normal size without bruit Extremities: 1+ edema. no cyanosis, no clubbing, no ulcers  Peripheral : 2+ bilateral upper extremity pulses, 2+ bilateral femoral pulses, 0+ bilateral dorsal pedal pulse Neuro: Alert and oriented. No facial asymmetry. No focal deficit. Moves all extremities spontaneously. Musculoskeletal: Normal muscle tone without kyphosis Psych:  Responds to questions appropriately with a normal affect.    Assessment: 57 year old male with acute on chronic systolic dysfunction congestive heart failure with cardiomyopathy and ejection fraction of 35% without evidence of acute coronary syndrome despite previous stents and coronary artery disease and severe COPD requiring extra oxygen and BiPAP due to hypoxia currently improved  Plan: 1.  Continue intravenous Lasix twice per day for acute on chronic systolic dysfunction congestive heart failure with pulmonary edema 2.  No further cardiac diagnostics necessary Tuesday due to no evidence of acute coronary syndrome with normal troponin despite some coronary artery atherosclerosis 3.  Continue supportive care of COPD and further assessment and treatment of possible causes of hypoxia and exacerbation 4.  Patient she will have congestive heart failure clinic evaluation and treatment and cardiac rehabilitation  Signed, Tuesday M.D. [facility] 07/25/2021, 2:31 PM

## 2021-07-25 NOTE — ED Notes (Signed)
Informed RN bed assigned 

## 2021-07-25 NOTE — Assessment & Plan Note (Addendum)
Patient has a history of coronary artery disease status post PCI with stent angioplasty with ischemic cardiomyopathy. Last known LVEF is 35 to 40% from a 2D echocardiogram which was done 09/22 Continue atorvastatin, Plavix and carvedilol.

## 2021-07-25 NOTE — Assessment & Plan Note (Addendum)
Patient has a BMI of 39.58 kg/m2 Complicates overall prognosis and care Lifestyle modification and exercise has been discussed with patient in detail.

## 2021-07-25 NOTE — ED Notes (Addendum)
Pt keeps taking bi-pap because "I want it off", MD aware   Pt placed on 4L/min via [place]

## 2021-07-26 DIAGNOSIS — J9621 Acute and chronic respiratory failure with hypoxia: Secondary | ICD-10-CM

## 2021-07-26 DIAGNOSIS — I5023 Acute on chronic systolic (congestive) heart failure: Secondary | ICD-10-CM | POA: Diagnosis not present

## 2021-07-26 DIAGNOSIS — I2510 Atherosclerotic heart disease of native coronary artery without angina pectoris: Secondary | ICD-10-CM

## 2021-07-26 DIAGNOSIS — J9622 Acute and chronic respiratory failure with hypercapnia: Secondary | ICD-10-CM

## 2021-07-26 DIAGNOSIS — Z9861 Coronary angioplasty status: Secondary | ICD-10-CM

## 2021-07-26 LAB — BASIC METABOLIC PANEL
Anion gap: 5 (ref 5–15)
BUN: 15 mg/dL (ref 6–20)
CO2: 36 mmol/L — ABNORMAL HIGH (ref 22–32)
Calcium: 8.7 mg/dL — ABNORMAL LOW (ref 8.9–10.3)
Chloride: 98 mmol/L (ref 98–111)
Creatinine, Ser: 1.14 mg/dL (ref 0.61–1.24)
GFR, Estimated: >60 mL/min (ref >60)
Glucose, Bld: 159 mg/dL — ABNORMAL HIGH (ref 70–99)
Potassium: 4.3 mmol/L (ref 3.5–5.1)
Sodium: 139 mmol/L (ref 135–145)

## 2021-07-26 LAB — CBC
HCT: 30.5 % — ABNORMAL LOW (ref 39.0–52.0)
Hemoglobin: 9.6 g/dL — ABNORMAL LOW (ref 13.0–17.0)
MCH: 33.6 pg (ref 26.0–34.0)
MCHC: 31.5 g/dL (ref 30.0–36.0)
MCV: 106.6 fL — ABNORMAL HIGH (ref 80.0–100.0)
Platelets: 251 10*3/uL (ref 150–400)
RBC: 2.86 MIL/uL — ABNORMAL LOW (ref 4.22–5.81)
RDW: 14.3 % (ref 11.5–15.5)
WBC: 13.3 10*3/uL — ABNORMAL HIGH (ref 4.0–10.5)
nRBC: 0 % (ref 0.0–0.2)

## 2021-07-26 LAB — HIV ANTIBODY (ROUTINE TESTING W REFLEX): HIV Screen 4th Generation wRfx: NONREACTIVE

## 2021-07-26 MED ORDER — OXYCODONE HCL 5 MG PO TABS
5.0000 mg | ORAL_TABLET | Freq: Three times a day (TID) | ORAL | Status: DC | PRN
  Administered 2021-07-26 – 2021-07-27 (×2): 5 mg via ORAL
  Filled 2021-07-26 (×2): qty 1

## 2021-07-26 MED ORDER — IBUPROFEN 400 MG PO TABS: 400.0000 mg | ORAL_TABLET | Freq: Four times a day (QID) | ORAL | Status: DC | PRN

## 2021-07-26 NOTE — Consult Note (Signed)
   Heart Failure Nurse Navigator Note  HFrEF previously reported at 35 to 40%.  He presented from home due to respiratory distress.  He also complained of chest tightness and bilateral lower extremity edema.  He had last been discharged in [facility] approximately 2 weeks ago and had been ordered a portable noninvasive ventilator for which he later declined delivery.  Comorbidities:  Obstructive sleep apnea Morbid obesity COPD Hypertension CVA Coronary artery disease status post stenting  Medications:  Atorvastatin 80 mg daily Carvedilol 3.125 mg 2 times a day Clopidogrel 75 mg daily Furosemide 40 mg IV once a day NicoDerm patch daily Thiamine 100 mg daily Folate acid 1 mg daily  Labs:  Sodium 139, potassium 4.3, chloride 98, CO2 36, BUN 15, creatinine 1.14 estimated GFR greater than 60, hemoglobin 9.6, hematocrit 30.5.  BNP on admission was 337. Weight is 156.3 kg Blood pressure 107/70   Initial meeting with patient on this admission.  There were no family members at the bedside.  Talked about heart failure and how it relates to him with the function of the bottom part of his heart at 35 to 40%.  Patient states that he lives at home with his girlfriend and several family members.  He states that his girlfriend is the one in charge of preparing the meals.  She continues to cook with salt and he also adds salt at the table.  Discussed the importance of following with the 2000 mg sodium restriction and the importance of removal from salt which he uses at the table from his diet.  Explained the relationship with salt/sodium and fluids, he did not feel that his girlfriend would stop cooking with salt.He states that they also eat quite frequently at restaurants.  Pointed out in the teaching booklet lower sodium suggestions from restaurants when eating out.  Also recommended to have the dietitian speak with he and his girlfriend but he states that he is not interested at this  time. He states his girlfriend is good at researching topics-recommended that she look into low sodium diets and recipes.  Talked about how all these changes in lifestyle such as diet, low sodium, not using salt and restricting fluid intake to help in improving quality of life.  Also aids in the reduction of hospitalizations.  Stressed the importance of 64 ounce fluid restriction in a 24-hour period, he voices concern over what he is supposed to do when he is thirsty.  Listed examples of sucking on hard candy, using ice chips, taking smaller sips rather than guzzling drink, frequent brushing of his teeth and using mouthwash and chewing gum.  Made aware that he has an outpatient appointment in the heart failure clinic for June 26 at 3:30 PM.  He was given the living with heart failure teaching booklet, information on low-sodium, heart failure, zone magnet and weight charts.  He had no further questions.  Tresa Endo RN CHFN

## 2021-07-26 NOTE — Progress Notes (Signed)
[facility] Encounter Note  Patient: Tommy Gaines / Admit Date: 07/25/2021 / Date of Encounter: 07/26/2021, 10:36 AM   Subjective: Patient has had a significant improvement in his symptoms overnight.  He has had approximately 1 L output of urine with intravenous Lasix.  Oxygen requirement has significantly improved.  He does have 2 L requirement for COPD and other chronic lung disease.  Patient is remaining on appropriate guideline medical therapy for moderate LV systolic dysfunction.  No current evidence of acute coronary syndrome.  Telemetry continues to show normal sinus rhythm  Review of Systems: Positive for: Shortness of breath Negative for: Vision change, hearing change, syncope, dizziness, nausea, vomiting,diarrhea, bloody stool, stomach pain, cough, congestion, diaphoresis, urinary frequency, urinary pain,skin lesions, skin rashes Others previously listed  Objective: Telemetry: Normal sinus rhythm Physical Exam: Blood pressure 125/68, pulse (!) 101, temperature 98.3 F (36.8 C), resp. rate 18, height 6\' 1"  (1.854 m), weight (!) 156.3 kg, SpO2 97 %. Body mass index is 45.46 kg/m. General: Well developed, well nourished, in no acute distress. Head: Normocephalic, atraumatic, sclera non-icteric, no xanthomas, nares are without discharge. Neck: No apparent masses Lungs: Normal respirations with diffuse wheezes, no rhonchi, no rales , some basilar crackles   Heart: Regular rate and rhythm, normal S1 S2, no murmur, no rub, no gallop, PMI is normal size and placement, carotid upstroke normal without bruit, jugular venous pressure normal Abdomen: Soft, non-tender, non-distended with normoactive bowel sounds. No hepatosplenomegaly. Abdominal aorta is normal size without bruit Extremities: 1+ edema, no clubbing, no cyanosis, no ulcers,  Peripheral: 2+ radial, 2+ femoral, 2+ dorsal pedal pulses Neuro: Alert and oriented. Moves all extremities  spontaneously. Psych:  Responds to questions appropriately with a normal affect.   Intake/Output Summary (Last 24 hours) at 07/26/2021 1036 Last data filed at 07/26/2021 0720 Gross per 24 hour  Intake 360 ml  Output 1225 ml  Net -865 ml    Inpatient Medications:   atorvastatin  80 mg Oral q morning   buPROPion  300 mg Oral Daily   carvedilol  3.125 mg Oral BID   clopidogrel  75 mg Oral Daily   enoxaparin (LOVENOX) injection  70 mg Subcutaneous [id number]   folic acid  1 mg Oral Daily   furosemide  40 mg Intravenous Daily   gabapentin  300 mg Oral TID   magnesium oxide  400 mg Oral Daily   mometasone-formoterol  2 puff Inhalation BID   nicotine  21 mg Transdermal Daily   sodium chloride flush  3 mL Intravenous Q12H   tamsulosin  0.4 mg Oral Daily   thiamine  100 mg Oral Daily   umeclidinium bromide  1 puff Inhalation Daily   Infusions:   sodium chloride      Labs: Recent Labs    07/25/21 0737 07/26/21 0538  NA 139 139  K 4.2 4.3  CL 96* 98  CO2 38* 36*  GLUCOSE 145* 159*  BUN 9 15  CREATININE 1.34* 1.14  CALCIUM 8.8* 8.7*   Recent Labs    07/25/21 0737  AST 26  ALT 37  ALKPHOS 79  BILITOT 0.5  PROT 6.6  ALBUMIN 3.3*   Recent Labs    07/25/21 0737 07/26/21 0538  WBC 12.3* 13.3*  NEUTROABS 10.3*  --   HGB 10.1* 9.6*  HCT 32.8* 30.5*  MCV 108.3* 106.6*  PLT 241 251   No results for input(s): "CKTOTAL", "CKMB", "TROPONINI" in the last 72 hours. Invalid input(s): "POCBNP" No results  for input(s): "HGBA1C" in the last 72 hours.   Weights: Filed Weights   07/25/21 0740 07/26/21 0650  Weight: 136.1 kg (!) 156.3 kg     Radiology/Studies:  [employer] Chest Port 1 View  Result Date: 07/25/2021 CLINICAL DATA:  SHOB, hx of COPD and CHF EXAM: PORTABLE CHEST 1 VIEW COMPARISON:  None Available. FINDINGS: Mild diffuse interstitial opacities. No confluent consolidation. No visible pleural effusions or pneumothorax. Enlarged cardiac silhouette. Pulmonary vascular  congestion. Partially imaged right reverse total shoulder arthroplasty. IMPRESSION: 1. Mild diffuse interstitial opacities, suggestive of interstitial edema. 2. Cardiomegaly and pulmonary vascular congestion. Electronically Signed   By: Margaretha Sheffield M.D.   On: 07/25/2021 08:34     Assessment and Recommendation  56 y.o. male with known acute on chronic moderate systolic dysfunction congestive heart failure chronic COPD requiring 2 L with coronary artery disease and previous stents but no evidence of acute coronary syndrome now improved with appropriate medication management 1.  Continue intravenous Lasix for further treatment of acute on chronic systolic dysfunction congestive heart failure for felt better urine output 2.  Continue 2 L of oxygenation due to COPD 3.  No further cardiac diagnostics necessary at this time 4.  Begin ambulation and follow improvements with also continued use of guideline medical therapy as before  Signed, Serafina Royals M.D. FACC

## 2021-07-26 NOTE — Progress Notes (Signed)
Progress Note   Patient: Tommy Gaines ION:[id number] DOB: 09/04/1964 DOA: 07/25/2021     1 DOS: the patient was seen and examined on 07/26/2021   Brief hospital course:  56 y.o. male with medical history significant for CAD with history of NSTEMI in 2021, complicated by acute systolic heart failure and cardiogenic shock with last known LVEF of 35 to 40% with decreased LV function and left ventricular regional wall motion abnormality (09/22), status post stent angioplasty with multiple stents including the LAD, COPD with history of respiratory failure requiring mechanical intubation, currently on 2 L of oxygen continuous,class III obesity, HTN, CVA, who presents to the emergency room in respiratory distress with EMS recording O2 sat in the 80s  6/8: Initially required BiPAP and now weaned off to 2 L nasal cannula oxygen   Assessment and Plan: * Acute on chronic systolic CHF (congestive heart failure) (HCC) Patient's last known LVEF is 35 to 40% with decreased LV function and left ventricular regional wall motion abnormality from a 2D echocardiogram which was done 09/22. Admitted for worsening shortness of breath from his baseline associated with bilateral lower extremity swelling and hypoxia Imaging shows mild diffuse interstitial opacities, suggestive of interstitial edema. BNP is elevated Continue Lasix 40 mg IV daily Monitor renal function closely since he developed AKI during his last hospitalization with peak serum creatinine at 5 from a baseline of 1.0  Today on admission his serum creatinine is 1.34-> 1.1 Continue to hold spironolactone and Entresto Continue carvedilol  cardiology input appreciated Net IO Since Admission: -1,385 mL [07/26/21 1232]   Acute on chronic respiratory failure with hypoxia and hypercapnia (HCC) Patient has a history of chronic respiratory failure and is on 2 L of oxygen continuous He presents for evaluation of worsening shortness of breath from his  baseline associated with increased work of breathing and hypoxia Venous blood gas showed uncompensated respiratory acidosis with hypercapnia Patient initially required BiPAP with improvement in his repeat venous blood gas Lab review shows he has chronic hypercapnia due to elevated bicarbonate level Continue BiPAP as needed Patient has been encouraged to get the CPAP that was ordered while he was at [facility] and use as recommended.  His CPAP was scheduled to be delivered tomorrow according to patient Maintain pulse oximetry greater than 92%  Obesity, Class III, BMI 40-49.9 (morbid obesity) (HCC) Patient has a BMI of 39.58 kg/m2 Complicates overall prognosis and care Lifestyle modification and exercise has been discussed with patient in detail.  Depression Continue bupropion.  CAD S/P percutaneous coronary angioplasty Patient has a history of coronary artery disease status post PCI with stent angioplasty with ischemic cardiomyopathy. Last known LVEF is 35 to 40% from a 2D echocardiogram which was done 09/22 Continue atorvastatin, Plavix and carvedilol.  Nicotine dependence Continue nicotine transdermal patch 21 mg daily.        Subjective: He is feeling much better  Physical Exam: Vitals:   07/26/21 0822 07/26/21 0900 07/26/21 1040 07/26/21 1122  BP:    107/70  Pulse:    80  Resp: 18 18 15 20   Temp:    98.5 F (36.9 C)  TempSrc:    Oral  SpO2:    98%  Weight:      Height:       Constitutional:      Appearance: He is morbidly obese.  HENT:     Head: Normocephalic and atraumatic.     Nose: Nose normal.     Mouth/Throat:  Mouth: Mucous membranes are moist.  Eyes:     Pupils: Pupils are equal, round, and reactive to light.  Cardiovascular:     Rate and Rhythm: Normal rate and regular rhythm.  Pulmonary:     Clear to auscultation bilaterally Abdominal:     General: Bowel sounds are normal.     Palpations: Abdomen is soft.     Comments: Central  adiposity  Musculoskeletal:        General: Normal range of motion.     Cervical back: Normal range of motion and neck supple.     Right lower leg: Edema present.     Left lower leg: Edema present.  Skin:    General: Skin is warm and dry.  Neurological:     Mental Status: He is oriented to person, place, and time.  Psychiatric:        Mood and Affect: Mood normal.        Behavior: Behavior normal.   Data Reviewed:  There are no new results to review at this time.  Family Communication: None  Disposition: Status is: Inpatient Remains inpatient appropriate because: Getting diuresis   Planned Discharge Destination: Home with Home Health    DVT prophylaxis-Lovenox Time spent: 35 minutes  Author: Delfino Lovett, MD 07/26/2021 12:35 PM  For on call review [email or web address].

## 2021-07-26 NOTE — Plan of Care (Signed)
Problem: Education: Goal: Ability to demonstrate management of disease process will improve Outcome: Progressing Goal: Ability to verbalize understanding of medication therapies will improve Outcome: Progressing   Problem: Education: Goal: Ability to verbalize understanding of medication therapies will improve Outcome: Progressing   Problem: Activity: Goal: Capacity to carry out activities will improve Outcome: Progressing   Problem: Cardiac: Goal: Ability to achieve and maintain adequate cardiopulmonary perfusion will improve Outcome: Progressing   Problem: Education: Goal: Knowledge of General Education information will improve Description: Including pain rating scale, medication(s)/side effects and non-pharmacologic comfort measures Outcome: Completed/Met   Problem: Health Behavior/Discharge Planning: Goal: Ability to manage health-related needs will improve 07/26/2021 0441 by Evert Kohl, RN Outcome: Progressing 07/26/2021 0437 by Evert Kohl, RN Outcome: Progressing   Problem: Clinical Measurements: Goal: Ability to maintain clinical measurements within normal limits will improve 07/26/2021 0441 by Evert Kohl, RN Outcome: Progressing 07/26/2021 0437 by Evert Kohl, RN Outcome: Progressing   Problem: Clinical Measurements: Goal: Will remain free from infection Outcome: Completed/Met   Problem: Clinical Measurements: Goal: Diagnostic test results will improve 07/26/2021 0441 by Evert Kohl, RN Outcome: Progressing 07/26/2021 0437 by Evert Kohl, RN Outcome: Progressing   Problem: Clinical Measurements: Goal: Respiratory complications will improve 07/26/2021 0441 by Evert Kohl, RN Outcome: Progressing 07/26/2021 0437 by Evert Kohl, RN Outcome: Progressing   Problem: Clinical Measurements: Goal: Cardiovascular complication will be avoided Outcome:  Completed/Met   Problem: Activity: Goal: Risk for activity intolerance will decrease 07/26/2021 0441 by Evert Kohl, RN Outcome: Completed/Met 07/26/2021 0437 by Evert Kohl, RN Outcome: Progressing   Problem: Nutrition: Goal: Adequate nutrition will be maintained 07/26/2021 0441 by Evert Kohl, RN Outcome: Completed/Met 07/26/2021 0437 by Evert Kohl, RN Outcome: Progressing   Problem: Coping: Goal: Level of anxiety will decrease Outcome: Completed/Met   Problem: Elimination: Goal: Will not experience complications related to bowel motility Outcome: Completed/Met Goal: Will not experience complications related to urinary retention 07/26/2021 0441 by Evert Kohl, RN Outcome: Completed/Met 07/26/2021 0437 by Evert Kohl, RN Outcome: Progressing   Problem: Elimination: Goal: Will not experience complications related to urinary retention 07/26/2021 0441 by Evert Kohl, RN Outcome: Completed/Met 07/26/2021 0437 by Evert Kohl, RN Outcome: Progressing   Problem: Pain Managment: Goal: General experience of comfort will improve 07/26/2021 0441 by Evert Kohl, RN Outcome: Progressing 07/26/2021 0437 by Evert Kohl, RN Outcome: Progressing   Problem: Pain Managment: Goal: General experience of comfort will improve 07/26/2021 0441 by Evert Kohl, RN Outcome: Progressing 07/26/2021 0437 by Evert Kohl, RN Outcome: Progressing   Problem: Safety: Goal: Ability to remain free from injury will improve Outcome: Completed/Met   Problem: Skin Integrity: Goal: Risk for impaired skin integrity will decrease Outcome: Completed/Met   Problem: Education: Goal: Knowledge of General Education information will improve Description: Including pain rating scale, medication(s)/side effects and non-pharmacologic comfort measures 07/26/2021 0441 by  Evert Kohl, RN Outcome: Completed/Met 07/26/2021 0437 by Evert Kohl, RN Outcome: Progressing   Problem: Health Behavior/Discharge Planning: Goal: Ability to manage health-related needs will improve 07/26/2021 0441 by Evert Kohl, RN Outcome: Progressing 07/26/2021 0437 by Evert Kohl, RN Outcome: Progressing   Problem: Clinical Measurements: Goal: Ability to maintain clinical measurements within normal limits will improve 07/26/2021 0441 by Evert Kohl, RN Outcome: Progressing 07/26/2021 0437 by Evert Kohl, RN Outcome: Progressing Goal: Will remain free from infection 07/26/2021 0441 by Evert Kohl,  RN Outcome: Completed/Met 07/26/2021 0437 by Evert Kohl, RN Outcome: Progressing Goal: Diagnostic test results will improve 07/26/2021 0441 by Evert Kohl, RN Outcome: Completed/Met 07/26/2021 0437 by Evert Kohl, RN Outcome: Progressing Goal: Respiratory complications will improve 07/26/2021 0441 by Evert Kohl, RN Outcome: Completed/Met 07/26/2021 0437 by Evert Kohl, RN Outcome: Progressing Goal: Cardiovascular complication will be avoided 07/26/2021 0441 by Evert Kohl, RN Outcome: Completed/Met 07/26/2021 0437 by Evert Kohl, RN Outcome: Progressing   Problem: Clinical Measurements: Goal: Will remain free from infection 07/26/2021 0441 by Evert Kohl, RN Outcome: Completed/Met 07/26/2021 0437 by Evert Kohl, RN Outcome: Progressing   Problem: Clinical Measurements: Goal: Diagnostic test results will improve 07/26/2021 0441 by Evert Kohl, RN Outcome: Completed/Met 07/26/2021 0437 by Evert Kohl, RN Outcome: Progressing   Problem: Clinical Measurements: Goal: Respiratory complications will improve 07/26/2021 0441 by Evert Kohl,  RN Outcome: Completed/Met 07/26/2021 0437 by Evert Kohl, RN Outcome: Progressing   Problem: Clinical Measurements: Goal: Cardiovascular complication will be avoided 07/26/2021 0441 by Evert Kohl, RN Outcome: Completed/Met 07/26/2021 0437 by Evert Kohl, RN Outcome: Progressing   Problem: Activity: Goal: Risk for activity intolerance will decrease Outcome: Completed/Met   Problem: Nutrition: Goal: Adequate nutrition will be maintained 07/26/2021 0441 by Evert Kohl, RN Outcome: Completed/Met 07/26/2021 0437 by Evert Kohl, RN Outcome: Progressing   Problem: Coping: Goal: Level of anxiety will decrease 07/26/2021 0441 by Evert Kohl, RN Outcome: Progressing 07/26/2021 0437 by Evert Kohl, RN Outcome: Progressing   Problem: Elimination: Goal: Will not experience complications related to bowel motility Outcome: Completed/Met Goal: Will not experience complications related to urinary retention Outcome: Completed/Met   Problem: Elimination: Goal: Will not experience complications related to urinary retention Outcome: Completed/Met   Problem: Pain Managment: Goal: General experience of comfort will improve Outcome: Progressing   Problem: Safety: Goal: Ability to remain free from injury will improve Outcome: Completed/Met   Problem: Elimination: Goal: Will not experience complications related to bowel motility Outcome: Completed/Met Goal: Will not experience complications related to urinary retention Outcome: Completed/Met   Problem: Pain Managment: Goal: General experience of comfort will improve Outcome: Progressing   Problem: Safety: Goal: Ability to remain free from injury will improve Outcome: Completed/Met   Problem: Skin Integrity: Goal: Risk for impaired skin integrity will decrease Outcome: Completed/Met

## 2021-07-26 NOTE — Hospital Course (Signed)
56 y.o. male with medical history significant for CAD with history of NSTEMI in 2021, complicated by acute systolic heart failure and cardiogenic shock with last known LVEF of 35 to 40% with decreased LV function and left ventricular regional wall motion abnormality (09/22), status post stent angioplasty with multiple stents including the LAD, COPD with history of respiratory failure requiring mechanical intubation, currently on 2 L of oxygen continuous,class III obesity, HTN, CVA, who presents to the emergency room in respiratory distress with EMS recording O2 sat in the 80s  6/8: Initially required BiPAP and now weaned off to 2 L nasal cannula oxygen

## 2021-07-26 NOTE — TOC Initial Note (Signed)
Transition of Care [facility]) - Initial/Assessment Note    Patient Details  Name: Tommy Gaines MRN: [id number] Date of Birth: 09/04/1964  Transition of Care [facility]) CM/SW Contact:    Laurena Slimmer, RN Phone Number: 07/26/2021, 4:22 PM  Clinical Narrative:                  Transition of Care [facility]) Screening Note   Patient Details  Name: Tommy Gaines Date of Birth: 09/04/1964   Transition of Care [facility]) CM/SW Contact:    Laurena Slimmer, RN Phone Number: 07/26/2021, 4:22 PM    Transition of Care Department [facility]) has reviewed patient and no TOC needs have been identified at this time. We will continue to monitor patient advancement through interdisciplinary progression rounds. If new patient transition needs arise, please place a TOC consult.          Patient Goals and CMS Choice        Expected Discharge Plan and Services                                                Prior Living Arrangements/Services                       Activities of Daily Living Home Assistive Devices/Equipment: None ADL Screening (condition at time of admission) Patient's cognitive ability adequate to safely complete daily activities?: Yes Is the patient deaf or have difficulty hearing?: No Does the patient have difficulty seeing, even when wearing glasses/contacts?: No Does the patient have difficulty concentrating, remembering, or making decisions?: No Patient able to express need for assistance with ADLs?: Yes Does the patient have difficulty dressing or bathing?: No Independently performs ADLs?: Yes (appropriate for developmental age) Does the patient have difficulty walking or climbing stairs?: Yes Weakness of Legs: Both Weakness of Arms/Hands: None  Permission Sought/Granted                  Emotional Assessment              Admission diagnosis:  CHF (congestive heart failure) (HCC) [I50.9] Acute pulmonary edema (HCC)  [J81.0] Respiratory distress [R06.03] Acute respiratory failure with hypoxia and hypercapnia ([place]) [J96.01, J96.02] Patient Active Problem List   Diagnosis Date Noted   Acute on chronic systolic CHF (congestive heart failure) ([place]) 07/25/2021   Obesity, Class III, BMI 40-49.9 (morbid obesity) ([place]) 07/25/2021   Acute on chronic respiratory failure with hypoxia and hypercapnia ([place]) 07/25/2021   Depression 07/25/2021   CAD S/P percutaneous coronary angioplasty 07/25/2021   Nicotine dependence 07/25/2021   PCP:  Pcp, No Pharmacy:   [id number] - [place], [place] - [address] [place] [address] Phone: [phone] Fax: [phone]     Social Determinants of Health (SDOH) Interventions    Readmission Risk Interventions     No data to display

## 2021-07-27 ENCOUNTER — Encounter: Payer: Self-pay | Admitting: Internal Medicine

## 2021-07-27 DIAGNOSIS — J9601 Acute respiratory failure with hypoxia: Secondary | ICD-10-CM

## 2021-07-27 DIAGNOSIS — J810 Acute pulmonary edema: Secondary | ICD-10-CM | POA: Diagnosis not present

## 2021-07-27 DIAGNOSIS — R0603 Acute respiratory distress: Secondary | ICD-10-CM | POA: Diagnosis not present

## 2021-07-27 DIAGNOSIS — I5023 Acute on chronic systolic (congestive) heart failure: Secondary | ICD-10-CM | POA: Diagnosis not present

## 2021-07-27 DIAGNOSIS — J9602 Acute respiratory failure with hypercapnia: Secondary | ICD-10-CM

## 2021-07-27 LAB — CBC
HCT: 29.8 % — ABNORMAL LOW (ref 39.0–52.0)
Hemoglobin: 9.1 g/dL — ABNORMAL LOW (ref 13.0–17.0)
MCH: 33.2 pg (ref 26.0–34.0)
MCHC: 30.5 g/dL (ref 30.0–36.0)
MCV: 108.8 fL — ABNORMAL HIGH (ref 80.0–100.0)
Platelets: 228 10*3/uL (ref 150–400)
RBC: 2.74 MIL/uL — ABNORMAL LOW (ref 4.22–5.81)
RDW: 15 % (ref 11.5–15.5)
WBC: 10.2 10*3/uL (ref 4.0–10.5)
nRBC: 0 % (ref 0.0–0.2)

## 2021-07-27 LAB — BASIC METABOLIC PANEL
Anion gap: 3 — ABNORMAL LOW (ref 5–15)
BUN: 16 mg/dL (ref 6–20)
CO2: 41 mmol/L — ABNORMAL HIGH (ref 22–32)
Calcium: 8.7 mg/dL — ABNORMAL LOW (ref 8.9–10.3)
Chloride: 97 mmol/L — ABNORMAL LOW (ref 98–111)
Creatinine, Ser: 1.09 mg/dL (ref 0.61–1.24)
GFR, Estimated: >60 mL/min (ref >60)
Glucose, Bld: 122 mg/dL — ABNORMAL HIGH (ref 70–99)
Potassium: 3.9 mmol/L (ref 3.5–5.1)
Sodium: 141 mmol/L (ref 135–145)

## 2021-07-27 MED ORDER — CLOPIDOGREL BISULFATE 75 MG PO TABS: 75.0000 mg | ORAL_TABLET | Freq: Every day | ORAL | 0 refills | Status: AC

## 2021-07-27 MED ORDER — OXYCODONE HCL 5 MG PO TABS: 10.0000 mg | ORAL_TABLET | ORAL | Status: DC

## 2021-07-27 MED ORDER — OXYCODONE HCL 5 MG PO TABS
5.0000 mg | ORAL_TABLET | ORAL | Status: AC
  Administered 2021-07-27: 5 mg via ORAL
  Filled 2021-07-27: qty 1

## 2021-07-27 NOTE — Progress Notes (Signed)
Pt. Wheeled down to ride's car.  Pt. Placed in car and made sure he hooked up to his personal oxygen tank at 2L.  Made sure tank was on and had at least 75% O2 remaining.  Patient belonging bag placed in back seat.

## 2021-07-27 NOTE — Discharge Summary (Signed)
Physician Discharge Summary   Patient: Tommy Gaines MRN: [id number] DOB: 25-Feb-1965  Admit date:     07/25/2021  Discharge date: 07/27/2021  Discharge Physician: Delfino Lovett   PCP: Pcp, No   Recommendations at discharge:   Follow-up with outpatient providers as requested  Discharge Diagnoses: Principal Problem:   Acute on chronic systolic CHF (congestive heart failure) (HCC) Active Problems:   Acute on chronic respiratory failure with hypoxia and hypercapnia (HCC)   Obesity, Class III, BMI 40-49.9 (morbid obesity) (HCC)   Depression   CAD S/P percutaneous coronary angioplasty   Nicotine dependence   Acute respiratory failure with hypoxia and hypercapnia (HCC)   Acute pulmonary edema (HCC)   Respiratory distress  Hospital Course:  56 y.o. male with medical history significant for CAD with history of NSTEMI in 2021, complicated by acute systolic heart failure and cardiogenic shock with last known LVEF of 35 to 40% with decreased LV function and left ventricular regional wall motion abnormality (09/22), status post stent angioplasty with multiple stents including the LAD, COPD with history of respiratory failure requiring mechanical intubation, currently on 2 L of oxygen continuous,class III obesity, HTN, CVA, who presents to the emergency room in respiratory distress with EMS recording O2 sat in the 80s  6/8: Initially required BiPAP and now weaned off to 2 L nasal cannula oxygen  Assessment and Plan: * Acute on chronic systolic CHF (congestive heart failure) (HCC) Patient's last known LVEF is 35 to 40% with decreased LV function and left ventricular regional wall motion abnormality from a 2D echocardiogram which was done 09/22. Admitted for worsening shortness of breath from his baseline associated with bilateral lower extremity swelling and hypoxia Imaging shows mild diffuse interstitial opacities, suggestive of interstitial edema. BNP is elevated Diuresed with Lasix while in  the hospital Net IO Since Admission: -2,305 mL [07/27/21 1744]   Acute on chronic respiratory failure with hypoxia and hypercapnia (HCC) Patient has a history of chronic respiratory failure and is on 2 L of oxygen continuous He presents for evaluation of worsening shortness of breath from his baseline associated with increased work of breathing and hypoxia Venous blood gas showed uncompensated respiratory acidosis with hypercapnia Patient initially required BiPAP with improvement in his repeat venous blood gas Lab review shows he has chronic hypercapnia due to elevated bicarbonate level Continue BiPAP as needed Patient has been encouraged to get the CPAP that was ordered while he was at [facility] and use as recommended.  His CPAP was scheduled to be delivered today at home on 6/9   Obesity, Class III, BMI 40-49.9 (morbid obesity) (HCC) Patient has a BMI of 39.58 kg/m2 Complicates overall prognosis and care Lifestyle modification and exercise has been discussed with patient in detail.  Depression Continue bupropion.  CAD S/P percutaneous coronary angioplasty Patient has a history of coronary artery disease status post PCI with stent angioplasty with ischemic cardiomyopathy. Last known LVEF is 35 to 40% from a 2D echocardiogram which was done 09/22 Continue atorvastatin, Plavix and carvedilol.  Nicotine dependence Continue nicotine transdermal patch 21 mg daily.         Consultants: Cardiology Disposition: Home Diet recommendation:  Discharge Diet Orders (From admission, onward)     Start     Ordered   07/27/21 0000  Diet - low sodium heart healthy        07/27/21 0803           Carb modified diet DISCHARGE MEDICATION: Allergies as of 07/27/2021  Reactions   Acetaminophen Nausea And Vomiting   Lidocaine Rash   Tylenol [acetaminophen] Hives        Medication List     STOP taking these medications    Entresto 24-26 MG Generic drug:  sacubitril-valsartan   spironolactone 25 MG tablet Commonly known as: ALDACTONE       TAKE these medications    atorvastatin 80 MG tablet Commonly known as: LIPITOR Take 80 mg by mouth every morning.   bumetanide 2 MG tablet Commonly known as: BUMEX Take 2 mg by mouth 2 (two) times daily. 1 TABLET (2 MG TOTAL) BY MOUTH DAILY AND 0.5 TABLETS (1 MG TOTAL) DAILY WITH LUNCH   buPROPion 300 MG 24 hr tablet Commonly known as: WELLBUTRIN XL Take 300 mg by mouth daily.   carvedilol 3.125 MG tablet Commonly known as: COREG Take 3.125 mg by mouth 2 (two) times daily.   clopidogrel 75 MG tablet Commonly known as: PLAVIX Take 1 tablet (75 mg total) by mouth daily.   folic acid 1 MG tablet Commonly known as: FOLVITE Take 1 mg by mouth daily.   furosemide 40 MG tablet Commonly known as: LASIX Take 80 mg by mouth daily.   gabapentin 300 MG capsule Commonly known as: NEURONTIN Take 300 mg by mouth 3 (three) times daily.   magnesium oxide 400 MG tablet Commonly known as: MAG-OX Take 1 tablet by mouth daily.   nicotine 21 mg/24hr patch Commonly known as: NICODERM CQ - dosed in mg/24 hours 21 mg daily.   oxyCODONE 5 MG immediate release tablet Commonly known as: Oxy IR/ROXICODONE Take 10 mg by mouth every 8 (eight) hours as needed.   Spiriva Respimat 2.5 MCG/ACT Aers Generic drug: Tiotropium Bromide Monohydrate Inhale 2 puffs into the lungs daily.   Symbicort 160-4.5 MCG/ACT inhaler Generic drug: budesonide-formoterol Inhale into the lungs.   tamsulosin 0.4 MG Caps capsule Commonly known as: FLOMAX Take 0.4 mg by mouth daily.   thiamine 100 MG tablet Take 100 mg by mouth daily.   Ventolin HFA 108 (90 Base) MCG/ACT inhaler Generic drug: albuterol Inhale 2 puffs into the lungs every 6 (six) hours as needed.        Discharge Exam: Filed Weights   07/25/21 0740 07/26/21 0650 07/27/21 0425  Weight: 136.1 kg (!) 156.3 kg (!) 156.7 kg   Constitutional:       Appearance: He is morbidly obese.  HENT:     Head: Normocephalic and atraumatic.     Nose: Nose normal.     Mouth/Throat:     Mouth: Mucous membranes are moist.  Eyes:     Pupils: Pupils are equal, round, and reactive to light.  Cardiovascular:     Rate and Rhythm: Normal rate and regular rhythm.  Pulmonary:     Clear to auscultation bilaterally Abdominal:     General: Bowel sounds are normal.     Palpations: Abdomen is soft.     Comments: Central adiposity  Skin:    General: Skin is warm and dry.  Neurological:     Mental Status: He is oriented to person, place, and time.  Psychiatric:        Mood and Affect: Mood normal.        Behavior: Behavior normal.   Condition at discharge: good  The results of significant diagnostics from this hospitalization (including imaging, microbiology, ancillary and laboratory) are listed below for reference.   Imaging Studies: DG Chest Port 1 View  Result Date: 07/25/2021 CLINICAL DATA:  SHOB, hx of COPD and CHF EXAM: PORTABLE CHEST 1 VIEW COMPARISON:  None Available. FINDINGS: Mild diffuse interstitial opacities. No confluent consolidation. No visible pleural effusions or pneumothorax. Enlarged cardiac silhouette. Pulmonary vascular congestion. Partially imaged right reverse total shoulder arthroplasty. IMPRESSION: 1. Mild diffuse interstitial opacities, suggestive of interstitial edema. 2. Cardiomegaly and pulmonary vascular congestion. Electronically Signed   By: Feliberto Harts M.D.   On: 07/25/2021 08:34    Microbiology: Results for orders placed or performed during the hospital encounter of 11/05/20  Resp Panel by RT-PCR (Flu A&B, Covid) Nasopharyngeal Swab     Status: None   Collection Time: 11/05/20 10:42 PM   Specimen: Nasopharyngeal Swab; Nasopharyngeal(NP) swabs in vial transport medium  Result Value Ref Range Status   SARS Coronavirus 2 by RT PCR NEGATIVE NEGATIVE Final    Comment: (NOTE) SARS-CoV-2 target nucleic acids are NOT  DETECTED.  The SARS-CoV-2 RNA is generally detectable in upper respiratory specimens during the acute phase of infection. The lowest concentration of SARS-CoV-2 viral copies this assay can detect is 138 copies/mL. A negative result does not preclude SARS-Cov-2 infection and should not be used as the sole basis for treatment or other patient management decisions. A negative result may occur with  improper specimen collection/handling, submission of specimen other than nasopharyngeal swab, presence of viral mutation(s) within the areas targeted by this assay, and inadequate number of viral copies(<138 copies/mL). A negative result must be combined with clinical observations, patient history, and epidemiological information. The expected result is Negative.  Fact Sheet for Patients:  BloggerCourse.com  Fact Sheet for Healthcare Providers:  SeriousBroker.it  This test is no t yet approved or cleared by the [place] FDA and  has been authorized for detection and/or diagnosis of SARS-CoV-2 by FDA under an Emergency Use Authorization (EUA). This EUA will remain  in effect (meaning this test can be used) for the duration of the COVID-19 declaration under Section 564(b)(1) of the Act, 21 U.S.C.section 360bbb-3(b)(1), unless the authorization is terminated  or revoked sooner.       Influenza A by PCR NEGATIVE NEGATIVE Final   Influenza B by PCR NEGATIVE NEGATIVE Final    Comment: (NOTE) The Xpert Xpress SARS-CoV-2/FLU/RSV plus assay is intended as an aid in the diagnosis of influenza from Nasopharyngeal swab specimens and should not be used as a sole basis for treatment. Nasal washings and aspirates are unacceptable for Xpert Xpress SARS-CoV-2/FLU/RSV testing.  Fact Sheet for Patients: BloggerCourse.com  Fact Sheet for Healthcare Providers: SeriousBroker.it  This test is not yet  approved or cleared by the [place] FDA and has been authorized for detection and/or diagnosis of SARS-CoV-2 by FDA under an Emergency Use Authorization (EUA). This EUA will remain in effect (meaning this test can be used) for the duration of the COVID-19 declaration under Section 564(b)(1) of the Act, 21 U.S.C. section 360bbb-3(b)(1), unless the authorization is terminated or revoked.  Performed at [facility], [address] [place], [place] [address]   Culture, blood (Routine X 2) w Reflex to ID Panel     Status: None   Collection Time: 11/05/20 10:42 PM   Specimen: BLOOD  Result Value Ref Range Status   Specimen Description BLOOD LEFT ASSIST CONTROL  Final   Special Requests   Final    BOTTLES DRAWN AEROBIC AND ANAEROBIC Blood Culture adequate volume   Culture   Final    NO GROWTH 5 DAYS Performed at [facility], [address], [place], [place]  [address]    Report Status 11/10/2020 FINAL  Final  Culture, blood (Routine X 2) w Reflex to ID Panel     Status: None   Collection Time: 11/05/20 11:18 PM   Specimen: BLOOD  Result Value Ref Range Status   Specimen Description BLOOD LEFT WRIST  Final   Special Requests   Final    BOTTLES DRAWN AEROBIC AND ANAEROBIC Blood Culture results may not be optimal due to an excessive volume of blood received in culture bottles   Culture   Final    NO GROWTH 5 DAYS Performed at [facility], [address] [place], [place] [address]    Report Status 11/10/2020 FINAL  Final    Labs: CBC: Recent Labs  Lab 07/25/21 0737 07/26/21 0538 07/27/21 0443  WBC 12.3* 13.3* 10.2  NEUTROABS 10.3*  --   --   HGB 10.1* 9.6* 9.1*  HCT 32.8* 30.5* 29.8*  MCV 108.3* 106.6* 108.8*  PLT 241 251 228   Basic Metabolic Panel: Recent Labs  Lab 07/25/21 0737 07/26/21 0538 07/27/21 0443  NA 139 139 141  K 4.2 4.3 3.9  CL 96* 98 97*  CO2 38* 36* 41*  GLUCOSE 145* 159* 122*  BUN 9 15 16   CREATININE 1.34*  1.14 1.09  CALCIUM 8.8* 8.7* 8.7*   Liver Function Tests: Recent Labs  Lab 07/25/21 0737  AST 26  ALT 37  ALKPHOS 79  BILITOT 0.5  PROT 6.6  ALBUMIN 3.3*   CBG: No results for input(s): "GLUCAP" in the last 168 hours.  Discharge time spent: greater than 30 minutes.  Signed: 09/24/21, MD [facility] 07/27/2021

## 2021-07-31 ENCOUNTER — Telehealth: Payer: Self-pay

## 2021-07-31 NOTE — Telephone Encounter (Signed)
   Post hospital discharge phone call.  Called and spoke directly with patient.  States that he has been weighing himself daily and it fluctuates 1 to 2 pounds up or down but he has not gained more than 3 pounds or a total of 5 pounds since discharge.  He is sticking with a 2 L fluid restriction and is not adding salt to his foods at the table.  He states that he is currently at his PCPs office waiting to be seen.  He states that he also received a call from the kidney doctor telling him to restart the Entresto but not to restart the spironolactone and to also discontinue the Coreg at this time.  Reminded him of his appointment in the outpatient heart failure clinic on June 28 at 3:30 in the afternoon.  He voices understanding  He had no further questions at this time.  Tresa Endo RN [facility]

## 2021-08-13 ENCOUNTER — Ambulatory Visit: Payer: Medicaid Other | Admitting: Family

## 2021-08-14 NOTE — Progress Notes (Deleted)
   Patient ID: Tommy Gaines, male    DOB: [birth year], 56 y.o.   MRN: [id number]  HPI  Tommy Gaines is a 56 y/o male with a history of  Echo report from 07/04/21 reviewed and showed an EF of 35-40% with mild LVH.   RHC/LHC done 02/08/20 and showed: LM: Normal LAD: 100% mid LAD CTO after a large diagonal. Diagonal has 75% long eccentric lesion.          Left-to-left and right-to-left collaterals from large diagonals, ramus, LCx, and distal RCA to mid LAD. LAD itself appears to terminate short of apex  Ramus: Prox 50% stenosis LCx: Focal 75 % prox stenosis RCA: Prox 95%, mid 75% stenosis. (Culprit vessel) TIMI flow II distally  RA: 14 mmHg RV: 51/12 mmHg PA: 51/29 mmHg, mean PAP 39 mmHg PCW: 24-28 mmHg Qp/Qs: 1.0  CO: 11.2 L/min CI: CI 4.2 L/min/m2 PAPi 0.7  Elevated filling pressures without shock. Able to titrate norepinephrine off in the lab  Successful coronary intervention     PTCA and stent placement mid RCA Resolute Onyx 3.5X26 mm DES     Post-dilatation using 3.75X15 mm [place] balloon, dilated up to 22 atm   Admitted 07/25/21 due to respiratory distress. Initially needed bipap but then weaned down to 2L. Cardiology consult obtained. Initially given IV lasix with transition to oral diuretics. Discharged after 2 days.   He presents today for his initial visit with a chief complaint of   Review of Systems    Physical Exam    Assessment & Plan:  1: Chronic heart failure with reduced ejection fraction- - NYHA class - BNP 07/25/21 was 377.5  2: HTN- - BP - CMP 07/30/21 reviewed and showed sodium 139, potassium 4.2, creatinine 1.18 & GFR 72  3: CAD- - previous stents and angioplasty - saw cardiology Tommy Gaines) 08/09/21  4: Tobacco use-  5: Hypercapnia-  - supposed to be wearing bipap

## 2021-08-15 ENCOUNTER — Ambulatory Visit: Payer: Medicaid Other | Admitting: Family

## 2021-08-15 ENCOUNTER — Telehealth: Payer: Self-pay | Admitting: Family

## 2021-08-15 NOTE — Telephone Encounter (Signed)
Patient did not show for his Heart Failure Clinic appointment on 08/15/21. Will attempt to reschedule.

## 2022-03-02 IMAGING — DX DG CHEST 1V PORT
1 series · 1 of 1 positions shown · non-contrast
Comparison: Chest radiograph 01/29/2020

CLINICAL DATA: Status post endotracheal tube placement.

EXAM:
PORTABLE CHEST 1 VIEW

[chest ap]
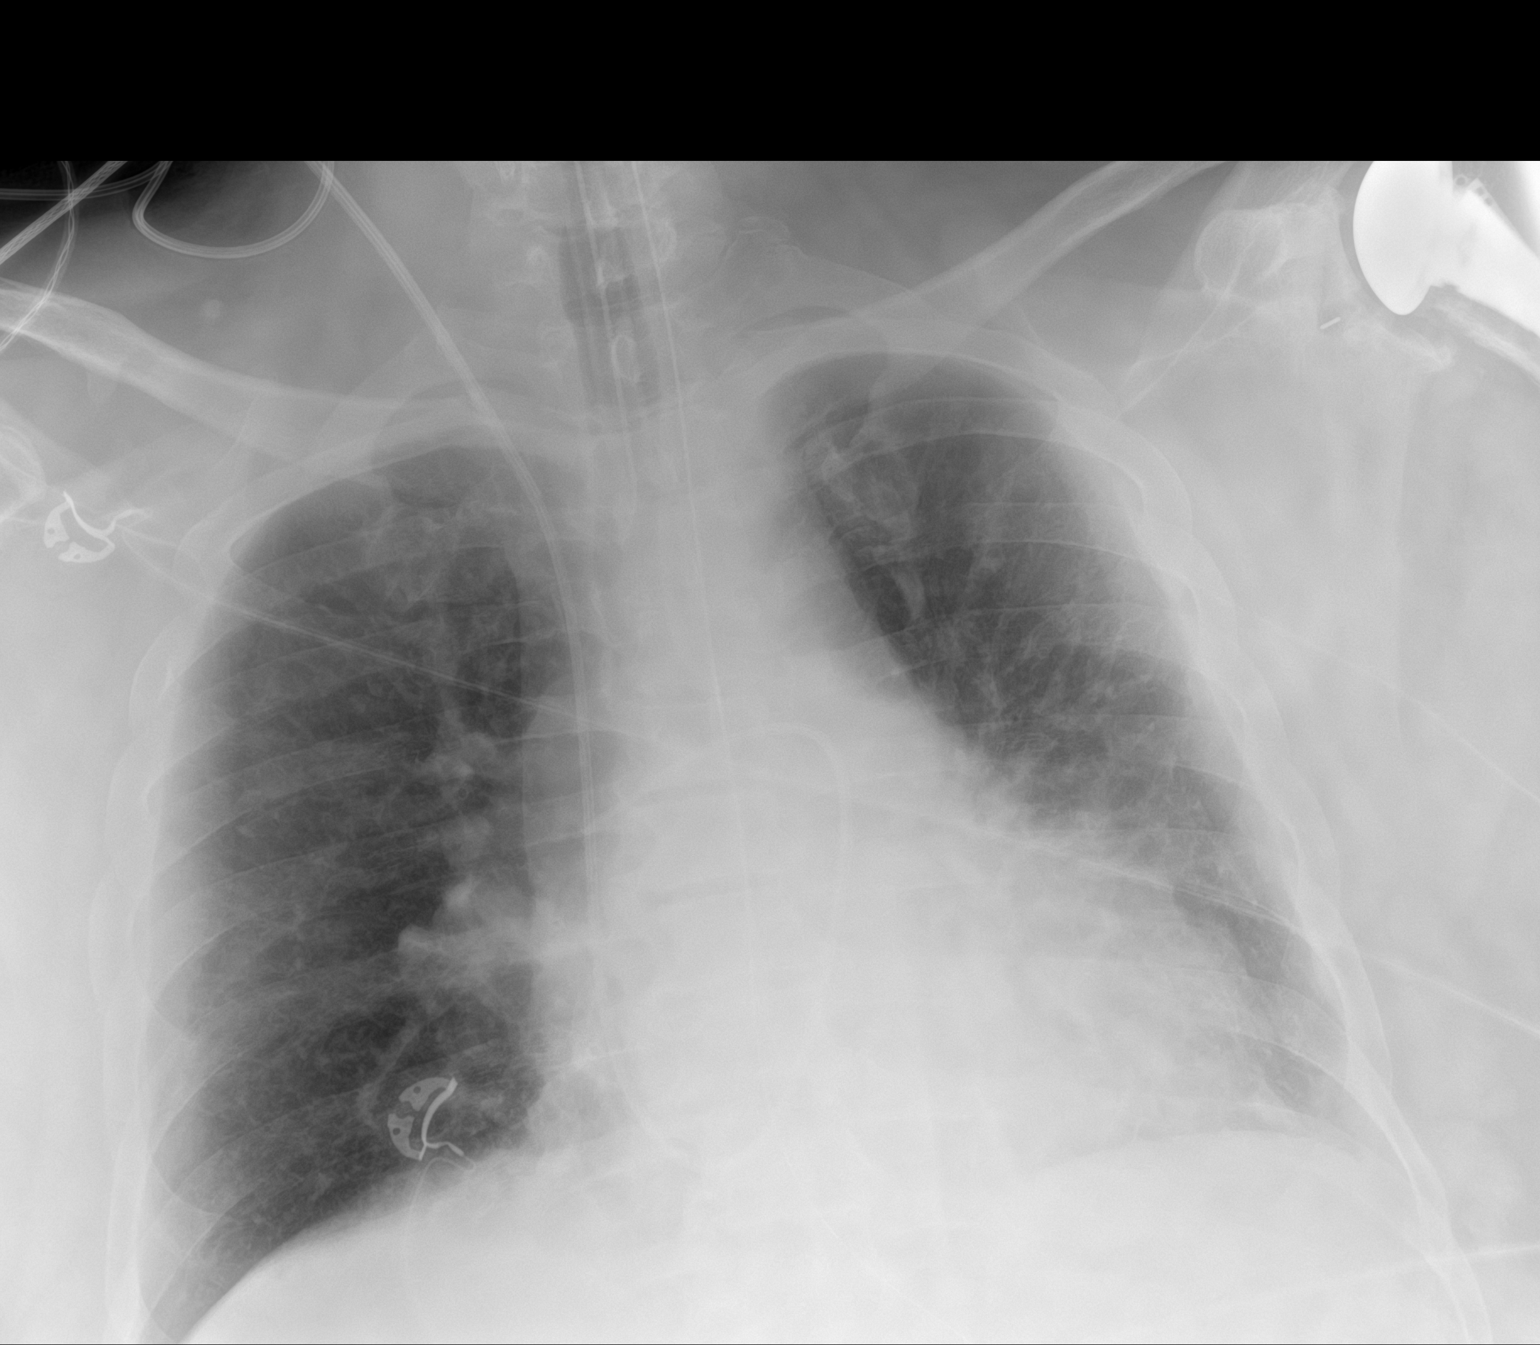

[1 of 1 positions shown; findings below may reference images not displayed]

FINDINGS: Stable positioning of an endotracheal tube and nasogastric tube.
Interval placement of a right PA catheter. Stable cardiomediastinal
contours. Diffuse bilateral interstitial opacities

Appear decreased from prior. No new focal consolidation. No
pneumothorax or large pleural effusion. No acute finding in the
visualized skeleton.
IMPRESSION: 1. Interval placement of a right PA catheter.
2. Improved bilateral interstitial opacities.

## 2022-12-07 IMAGING — DX DG CHEST 1V PORT
1 series · 2 of 2 positions shown · non-contrast
Comparison: 01/31/2020

CLINICAL DATA: Shortness of breath. Difficulty breathing. COPD and
cold symptoms.

EXAM:
PORTABLE CHEST 1 VIEW

[Series 1: chest ap · 0.14mm/px · 2 of 2 slices shown]
[im 1/2]
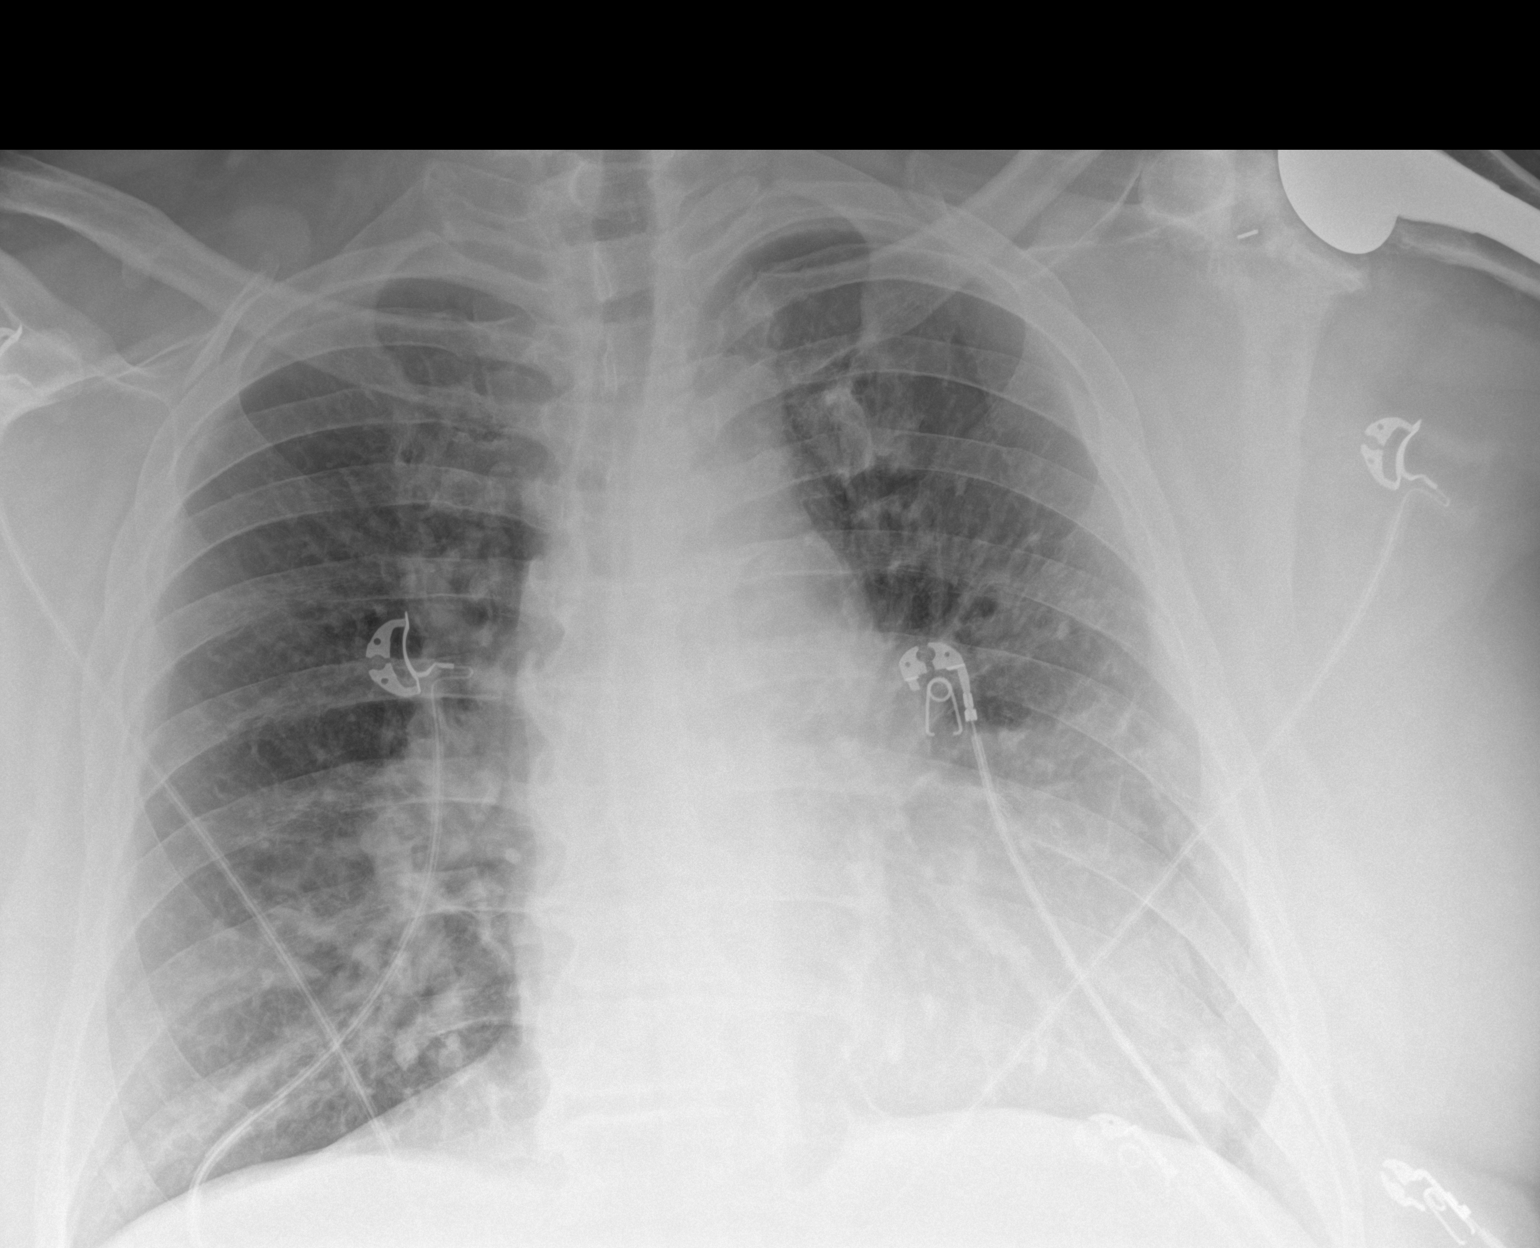
[im 2/2]
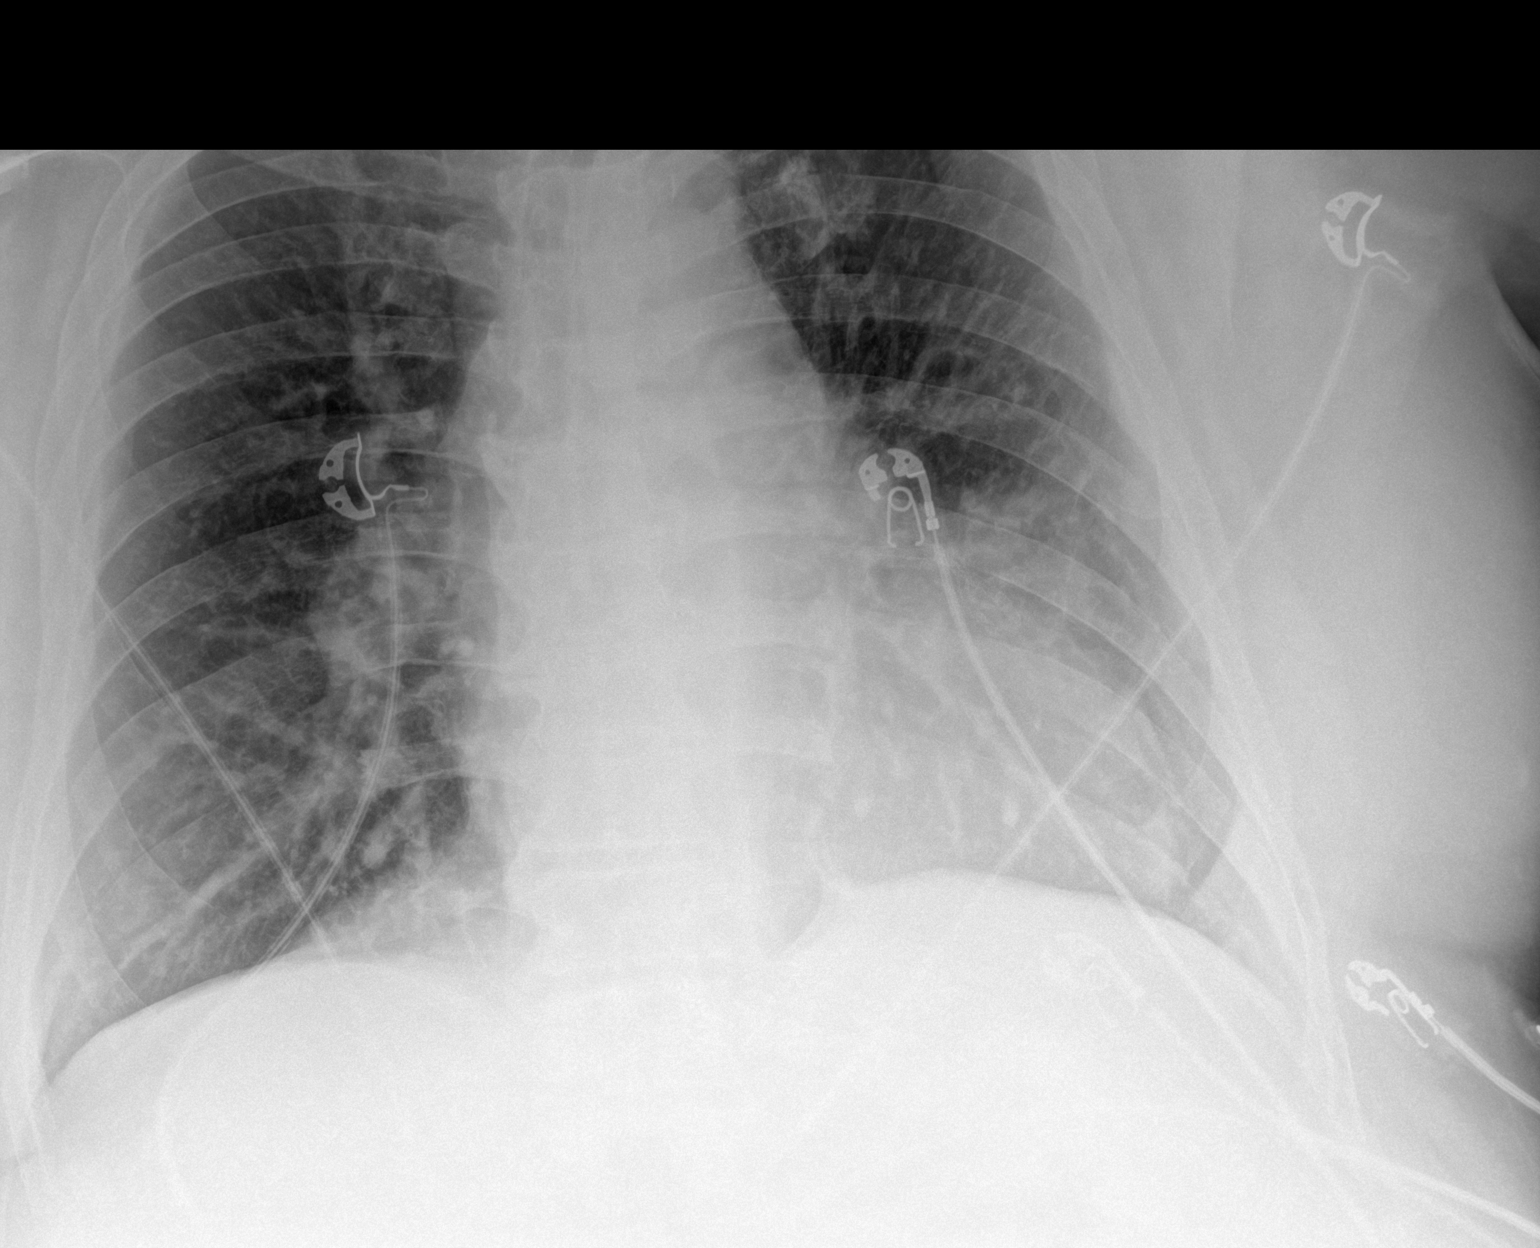

[2 of 2 positions shown; findings below may reference images not displayed]

FINDINGS: Cardiac enlargement. Possible patchy perihilar infiltration on the
left. This may indicate pneumonia or early edema. No pleural
effusions. No pneumothorax. Tortuous aorta. Postoperative changes in
the left shoulder.
IMPRESSION: Cardiac enlargement. Suggestion of patchy perihilar infiltration on
the left.

## 2023-01-02 ENCOUNTER — Other Ambulatory Visit (HOSPITAL_COMMUNITY): Payer: Self-pay

## 2023-01-02 ENCOUNTER — Other Ambulatory Visit: Payer: Self-pay

## 2023-01-02 ENCOUNTER — Emergency Department: Payer: Medicaid Other

## 2023-01-02 ENCOUNTER — Inpatient Hospital Stay
Admission: EM | Admit: 2023-01-02 | Discharge: 2023-01-04 | DRG: 189 | Disposition: A | Discharge status: Home / Self Care | Payer: Medicaid Other | Attending: Internal Medicine | Admitting: Internal Medicine

## 2023-01-02 ENCOUNTER — Encounter: Payer: Self-pay | Admitting: Family Medicine

## 2023-01-02 ENCOUNTER — Inpatient Hospital Stay (HOSPITAL_COMMUNITY)
Admit: 2023-01-02 | Discharge: 2023-01-02 | Disposition: A | Discharge status: Home / Self Care | Payer: Medicaid Other | Attending: Internal Medicine | Admitting: Internal Medicine

## 2023-01-02 DIAGNOSIS — I214 Non-ST elevation (NSTEMI) myocardial infarction: Secondary | ICD-10-CM

## 2023-01-02 DIAGNOSIS — K219 Gastro-esophageal reflux disease without esophagitis: Secondary | ICD-10-CM | POA: Diagnosis present

## 2023-01-02 DIAGNOSIS — F32A Depression, unspecified: Secondary | ICD-10-CM | POA: Diagnosis present

## 2023-01-02 DIAGNOSIS — I2489 Other forms of acute ischemic heart disease: Secondary | ICD-10-CM | POA: Diagnosis present

## 2023-01-02 DIAGNOSIS — B348 Other viral infections of unspecified site: Secondary | ICD-10-CM | POA: Diagnosis present

## 2023-01-02 DIAGNOSIS — E785 Hyperlipidemia, unspecified: Secondary | ICD-10-CM | POA: Diagnosis present

## 2023-01-02 DIAGNOSIS — Z7982 Long term (current) use of aspirin: Secondary | ICD-10-CM | POA: Diagnosis not present

## 2023-01-02 DIAGNOSIS — G894 Chronic pain syndrome: Secondary | ICD-10-CM | POA: Diagnosis present

## 2023-01-02 DIAGNOSIS — F17200 Nicotine dependence, unspecified, uncomplicated: Secondary | ICD-10-CM | POA: Diagnosis present

## 2023-01-02 DIAGNOSIS — Z91198 Patient's noncompliance with other medical treatment and regimen for other reason: Secondary | ICD-10-CM

## 2023-01-02 DIAGNOSIS — Z1152 Encounter for screening for COVID-19: Secondary | ICD-10-CM | POA: Diagnosis not present

## 2023-01-02 DIAGNOSIS — F17210 Nicotine dependence, cigarettes, uncomplicated: Secondary | ICD-10-CM | POA: Diagnosis present

## 2023-01-02 DIAGNOSIS — Z7902 Long term (current) use of antithrombotics/antiplatelets: Secondary | ICD-10-CM | POA: Diagnosis not present

## 2023-01-02 DIAGNOSIS — Z955 Presence of coronary angioplasty implant and graft: Secondary | ICD-10-CM

## 2023-01-02 DIAGNOSIS — J9622 Acute and chronic respiratory failure with hypercapnia: Secondary | ICD-10-CM | POA: Diagnosis present

## 2023-01-02 DIAGNOSIS — I5023 Acute on chronic systolic (congestive) heart failure: Secondary | ICD-10-CM

## 2023-01-02 DIAGNOSIS — Z7984 Long term (current) use of oral hypoglycemic drugs: Secondary | ICD-10-CM | POA: Diagnosis not present

## 2023-01-02 DIAGNOSIS — I252 Old myocardial infarction: Secondary | ICD-10-CM

## 2023-01-02 DIAGNOSIS — J441 Chronic obstructive pulmonary disease with (acute) exacerbation: Principal | ICD-10-CM | POA: Diagnosis present

## 2023-01-02 DIAGNOSIS — I2510 Atherosclerotic heart disease of native coronary artery without angina pectoris: Secondary | ICD-10-CM | POA: Diagnosis present

## 2023-01-02 DIAGNOSIS — Z886 Allergy status to analgesic agent status: Secondary | ICD-10-CM

## 2023-01-02 DIAGNOSIS — Z7951 Long term (current) use of inhaled steroids: Secondary | ICD-10-CM | POA: Diagnosis not present

## 2023-01-02 DIAGNOSIS — Z79899 Other long term (current) drug therapy: Secondary | ICD-10-CM

## 2023-01-02 DIAGNOSIS — T50916A Underdosing of multiple unspecified drugs, medicaments and biological substances, initial encounter: Secondary | ICD-10-CM | POA: Diagnosis present

## 2023-01-02 DIAGNOSIS — E669 Obesity, unspecified: Secondary | ICD-10-CM | POA: Diagnosis present

## 2023-01-02 DIAGNOSIS — Z6836 Body mass index (BMI) 36.0-36.9, adult: Secondary | ICD-10-CM

## 2023-01-02 DIAGNOSIS — Z9981 Dependence on supplemental oxygen: Secondary | ICD-10-CM

## 2023-01-02 DIAGNOSIS — I130 Hypertensive heart and chronic kidney disease with heart failure and stage 1 through stage 4 chronic kidney disease, or unspecified chronic kidney disease: Secondary | ICD-10-CM | POA: Diagnosis present

## 2023-01-02 DIAGNOSIS — J9621 Acute and chronic respiratory failure with hypoxia: Secondary | ICD-10-CM | POA: Diagnosis present

## 2023-01-02 DIAGNOSIS — Z8673 Personal history of transient ischemic attack (TIA), and cerebral infarction without residual deficits: Secondary | ICD-10-CM

## 2023-01-02 DIAGNOSIS — Z8249 Family history of ischemic heart disease and other diseases of the circulatory system: Secondary | ICD-10-CM

## 2023-01-02 DIAGNOSIS — Z833 Family history of diabetes mellitus: Secondary | ICD-10-CM

## 2023-01-02 DIAGNOSIS — Z888 Allergy status to other drugs, medicaments and biological substances status: Secondary | ICD-10-CM

## 2023-01-02 DIAGNOSIS — Z9861 Coronary angioplasty status: Secondary | ICD-10-CM | POA: Diagnosis not present

## 2023-01-02 LAB — ECHOCARDIOGRAM COMPLETE
Height: 73 in
S' Lateral: 5.6 cm
Weight: 4613.79 [oz_av]

## 2023-01-02 LAB — BASIC METABOLIC PANEL
Anion gap: 8 (ref 5–15)
BUN: 7 mg/dL (ref 6–20)
CO2: 30 mmol/L (ref 22–32)
Calcium: 9 mg/dL (ref 8.9–10.3)
Chloride: 98 mmol/L (ref 98–111)
Creatinine, Ser: 0.93 mg/dL (ref 0.61–1.24)
GFR, Estimated: >60 mL/min (ref >60)
Glucose, Bld: 118 mg/dL — ABNORMAL HIGH (ref 70–99)
Potassium: 4.2 mmol/L (ref 3.5–5.1)
Sodium: 136 mmol/L (ref 135–145)

## 2023-01-02 LAB — CBC WITH DIFFERENTIAL/PLATELET
Abs Immature Granulocytes: 0.02 10*3/uL (ref 0.00–0.07)
Basophils Absolute: 0 10*3/uL (ref 0.0–0.1)
Basophils Relative: 1 %
Eosinophils Absolute: 0.1 10*3/uL (ref 0.0–0.5)
Eosinophils Relative: 1 %
HCT: 46.4 % (ref 39.0–52.0)
Hemoglobin: 14.7 g/dL (ref 13.0–17.0)
Immature Granulocytes: 0 %
Lymphocytes Relative: 9 %
Lymphs Abs: 0.7 10*3/uL (ref 0.7–4.0)
MCH: 33.7 pg (ref 26.0–34.0)
MCHC: 31.7 g/dL (ref 30.0–36.0)
MCV: 106.4 fL — ABNORMAL HIGH (ref 80.0–100.0)
Monocytes Absolute: 0.5 10*3/uL (ref 0.1–1.0)
Monocytes Relative: 7 %
Neutro Abs: 6.2 10*3/uL (ref 1.7–7.7)
Neutrophils Relative %: 82 %
Platelets: 247 10*3/uL (ref 150–400)
RBC: 4.36 MIL/uL (ref 4.22–5.81)
RDW: 13.3 % (ref 11.5–15.5)
WBC: 7.6 10*3/uL (ref 4.0–10.5)
nRBC: 0 % (ref 0.0–0.2)

## 2023-01-02 LAB — TROPONIN I (HIGH SENSITIVITY)
Troponin I (High Sensitivity): 118 ng/L (ref <18)
Troponin I (High Sensitivity): 104 ng/L (ref <18)

## 2023-01-02 LAB — BLOOD GAS, VENOUS
Acid-Base Excess: 4.1 mmol/L — ABNORMAL HIGH (ref 0.0–2.0)
Acid-Base Excess: 3 mmol/L — ABNORMAL HIGH (ref 0.0–2.0)
Bicarbonate: 34.8 mmol/L — ABNORMAL HIGH (ref 20.0–28.0)
Bicarbonate: 31.2 mmol/L — ABNORMAL HIGH (ref 20.0–28.0)
O2 Saturation: 82.8 %
Patient temperature: 37
Patient temperature: 37
pCO2, Ven: 85 mm[Hg] (ref 44–60)
pCO2, Ven: 62 mmHg — ABNORMAL HIGH (ref 44–60)
pH, Ven: 7.22 — ABNORMAL LOW (ref 7.25–7.43)
pH, Ven: 7.31 (ref 7.25–7.43)
pO2, Ven: 49 mmHg — ABNORMAL HIGH (ref 32–45)

## 2023-01-02 LAB — BRAIN NATRIURETIC PEPTIDE: B Natriuretic Peptide: 424.4 pg/mL — ABNORMAL HIGH (ref 0.0–100.0)

## 2023-01-02 LAB — PROTIME-INR
INR: 1.2 (ref 0.8–1.2)
Prothrombin Time: 15.1 s (ref 11.4–15.2)

## 2023-01-02 LAB — APTT: aPTT: 159 s — ABNORMAL HIGH (ref 24–36)

## 2023-01-02 LAB — SARS CORONAVIRUS 2 BY RT PCR: SARS Coronavirus 2 by RT PCR: NEGATIVE

## 2023-01-02 LAB — MRSA NEXT GEN BY PCR, NASAL: MRSA by PCR Next Gen: NOT DETECTED

## 2023-01-02 LAB — HEPARIN LEVEL (UNFRACTIONATED): Heparin Unfractionated: 0.15 [IU]/mL — ABNORMAL LOW (ref 0.30–0.70)

## 2023-01-02 MED ORDER — HEPARIN (PORCINE) 25000 UT/250ML-% IV SOLN
1850.0000 [IU]/h | INTRAVENOUS | Status: DC
  Administered 2023-01-02: 1450 [IU]/h via INTRAVENOUS
  Administered 2023-01-02: 1850 [IU]/h via INTRAVENOUS
  Filled 2023-01-02 (×2): qty 250

## 2023-01-02 MED ORDER — SODIUM CHLORIDE 0.9% FLUSH: 3.0000 mL | INTRAVENOUS | Status: DC | PRN

## 2023-01-02 MED ORDER — HEPARIN BOLUS VIA INFUSION
4000.0000 [IU] | Freq: Once | INTRAVENOUS | Status: AC
Start: 2023-01-02 — End: 2023-01-02
  Administered 2023-01-02: 4000 [IU] via INTRAVENOUS
  Filled 2023-01-02: qty 4000

## 2023-01-02 MED ORDER — ENOXAPARIN SODIUM 80 MG/0.8ML IJ SOSY
65.0000 mg | PREFILLED_SYRINGE | INTRAMUSCULAR | Status: DC
  Administered 2023-01-02 – 2023-01-03 (×2): 65 mg via SUBCUTANEOUS
  Filled 2023-01-02 (×2): qty 0.8

## 2023-01-02 MED ORDER — CLOPIDOGREL BISULFATE 75 MG PO TABS
75.0000 mg | ORAL_TABLET | Freq: Every morning | ORAL | Status: DC
  Administered 2023-01-02 – 2023-01-04 (×3): 75 mg via ORAL
  Filled 2023-01-02 (×3): qty 1

## 2023-01-02 MED ORDER — FUROSEMIDE 10 MG/ML IJ SOLN
40.0000 mg | Freq: Once | INTRAMUSCULAR | Status: AC
  Administered 2023-01-02: 40 mg via INTRAVENOUS
  Filled 2023-01-02: qty 4

## 2023-01-02 MED ORDER — KETOROLAC TROMETHAMINE 30 MG/ML IJ SOLN: 30.0000 mg | Freq: Three times a day (TID) | INTRAMUSCULAR | Status: DC | PRN

## 2023-01-02 MED ORDER — FUROSEMIDE 10 MG/ML IJ SOLN
40.0000 mg | Freq: Once | INTRAMUSCULAR | Status: AC
Start: 2023-01-02 — End: 2023-01-02
  Administered 2023-01-02: 40 mg via INTRAVENOUS
  Filled 2023-01-02: qty 4

## 2023-01-02 MED ORDER — NICOTINE POLACRILEX 2 MG MT GUM: 2.0000 mg | CHEWING_GUM | OROMUCOSAL | Status: DC | PRN

## 2023-01-02 MED ORDER — ASPIRIN 81 MG PO CHEW
324.0000 mg | CHEWABLE_TABLET | Freq: Once | ORAL | Status: AC
  Administered 2023-01-02: 324 mg via ORAL
  Filled 2023-01-02: qty 4

## 2023-01-02 MED ORDER — NICOTINE 14 MG/24HR TD PT24
14.0000 mg | MEDICATED_PATCH | Freq: Every day | TRANSDERMAL | Status: DC
  Administered 2023-01-03 – 2023-01-04 (×2): 14 mg via TRANSDERMAL
  Filled 2023-01-02 (×3): qty 1

## 2023-01-02 MED ORDER — UMECLIDINIUM BROMIDE 62.5 MCG/ACT IN AEPB
1.0000 | INHALATION_SPRAY | Freq: Every day | RESPIRATORY_TRACT | Status: DC
  Administered 2023-01-03 – 2023-01-04 (×2): 1 via RESPIRATORY_TRACT
  Filled 2023-01-02: qty 7

## 2023-01-02 MED ORDER — ATORVASTATIN CALCIUM 80 MG PO TABS
80.0000 mg | ORAL_TABLET | Freq: Every day | ORAL | Status: DC
  Administered 2023-01-02 – 2023-01-04 (×3): 80 mg via ORAL
  Filled 2023-01-02: qty 4
  Filled 2023-01-02 (×2): qty 1

## 2023-01-02 MED ORDER — OXYCODONE HCL 5 MG PO TABS
10.0000 mg | ORAL_TABLET | Freq: Three times a day (TID) | ORAL | Status: DC | PRN
  Administered 2023-01-02 – 2023-01-04 (×4): 10 mg via ORAL
  Filled 2023-01-02 (×4): qty 2

## 2023-01-02 MED ORDER — ALBUTEROL SULFATE (2.5 MG/3ML) 0.083% IN NEBU
5.0000 mg | INHALATION_SOLUTION | Freq: Once | RESPIRATORY_TRACT | Status: AC
  Administered 2023-01-02: 5 mg via RESPIRATORY_TRACT
  Filled 2023-01-02: qty 6

## 2023-01-02 MED ORDER — ASPIRIN 81 MG PO TBEC
81.0000 mg | DELAYED_RELEASE_TABLET | Freq: Every day | ORAL | Status: DC
  Administered 2023-01-03 – 2023-01-04 (×2): 81 mg via ORAL
  Filled 2023-01-02 (×2): qty 1

## 2023-01-02 MED ORDER — IPRATROPIUM-ALBUTEROL 0.5-2.5 (3) MG/3ML IN SOLN
3.0000 mL | Freq: Once | RESPIRATORY_TRACT | Status: AC
  Administered 2023-01-02: 3 mL via RESPIRATORY_TRACT
  Filled 2023-01-02: qty 3

## 2023-01-02 MED ORDER — BUPROPION HCL ER (XL) 150 MG PO TB24
300.0000 mg | ORAL_TABLET | Freq: Every day | ORAL | Status: DC
  Administered 2023-01-02 – 2023-01-04 (×3): 300 mg via ORAL
  Filled 2023-01-02 (×3): qty 2

## 2023-01-02 MED ORDER — SODIUM CHLORIDE 0.9% FLUSH
3.0000 mL | Freq: Two times a day (BID) | INTRAVENOUS | Status: DC
  Administered 2023-01-02 – 2023-01-04 (×4): 3 mL via INTRAVENOUS

## 2023-01-02 MED ORDER — IPRATROPIUM-ALBUTEROL 0.5-2.5 (3) MG/3ML IN SOLN
3.0000 mL | RESPIRATORY_TRACT | Status: DC | PRN
  Administered 2023-01-02 – 2023-01-03 (×2): 3 mL via RESPIRATORY_TRACT
  Filled 2023-01-02 (×2): qty 3

## 2023-01-02 MED ORDER — PANTOPRAZOLE SODIUM 40 MG PO TBEC
40.0000 mg | DELAYED_RELEASE_TABLET | Freq: Every day | ORAL | Status: DC
  Administered 2023-01-03 – 2023-01-04 (×2): 40 mg via ORAL
  Filled 2023-01-02 (×3): qty 1

## 2023-01-02 MED ORDER — TIOTROPIUM BROMIDE MONOHYDRATE 2.5 MCG/ACT IN AERS: 2.0000 | INHALATION_SPRAY | Freq: Every day | RESPIRATORY_TRACT | Status: DC

## 2023-01-02 MED ORDER — FLUTICASONE FUROATE-VILANTEROL 200-25 MCG/ACT IN AEPB
1.0000 | INHALATION_SPRAY | Freq: Every day | RESPIRATORY_TRACT | Status: DC
  Administered 2023-01-03 – 2023-01-04 (×2): 1 via RESPIRATORY_TRACT
  Filled 2023-01-02: qty 28

## 2023-01-02 MED ORDER — ONDANSETRON HCL 4 MG/2ML IJ SOLN: 4.0000 mg | Freq: Four times a day (QID) | INTRAMUSCULAR | Status: DC | PRN

## 2023-01-02 MED ORDER — HEPARIN BOLUS VIA INFUSION
3200.0000 [IU] | Freq: Once | INTRAVENOUS | Status: AC
  Administered 2023-01-02: 3200 [IU] via INTRAVENOUS
  Filled 2023-01-02: qty 3200

## 2023-01-02 NOTE — H&P (Signed)
History and Physical    Patient: Tommy Gaines NFA:[id number] DOB: 01/10/1966 DOA: 01/02/2023 DOS: the patient was seen and examined on 01/02/2023 PCP: Pcp, No  Patient coming from: Home  Chief Complaint:  Chief Complaint  Patient presents with   Shortness of Breath   HPI: Tommy Gaines is a 57 y.o. male with medical history significant of  CAD, hypertension, dyslipidemia, CVA, chronic HFrEF LVEF 35-40% in Jan 2024 at [facility], tobacco use disorder, chronic pain, depression, GERD, CKD, COPD.  He presents to [facility] regional ED with shortness of breath that started yesterday evening with associated wheezing. With EMS he was hypoxic 88% on 3 L and received 2 DuoNebs and 125 mg IV Solu-Medrol and arrival.  He denies chest pain, palpitations, lower extremity edema.  Reports that girlfriend and her nephew that live with them have been sick with respiratory illness recently. Denies change in cough or increased sputum production.   ED Course: On arrival to [facility] regional ED afebrile he was noted to beafebrile with temp 36.9C, BP 142/92, heart rate 103, respiratory rate 25, SpO2 100% on 6 L O2 via mask.  CXR obtained and shows mild cardiomegaly, pulmonary vascular congestion, and stable bilateral atelectasis.  Labs notable for VBG pH 7.22, pCO2 85, and bicarb 34.8.  Troponin 118--> 104, BNP 424.4.  Patient started on IV heparin in ED, and given Albuterol, 325 aspirin, 40 mg IV Lasix, and DuoNebs.  TRH contacted for admission.  Review of Systems: As mentioned in the history of present illness. All other systems reviewed and are negative. Past Medical History:  Diagnosis Date   Arthritis    COPD (chronic obstructive pulmonary disease) (HCC)    Depression    Diverticulitis    Hyperlipidemia    Hypertension    Stroke (HCC) 2016   confirmed by CT scan    Past Surgical History:  Procedure Laterality Date   CORONARY STENT INTERVENTION N/A 01/30/2020   Procedure: CORONARY STENT  INTERVENTION;  Surgeon: Elder Negus, MD;  Location: [facility] INVASIVE CV LAB;  Service: Cardiovascular;  Laterality: N/A;   CORONARY STENT INTERVENTION N/A 02/01/2020   Procedure: CORONARY STENT INTERVENTION;  Surgeon: Yates Decamp, MD;  Location: [facility] INVASIVE CV LAB;  Service: Cardiovascular;  Laterality: N/A;   intestinal rupture  2012   colostomy with take down as a result of this surgery.  colostomy reversed 6 months post surgery.   JOINT REPLACEMENT Bilateral    shoulders   RIGHT/LEFT HEART CATH AND CORONARY ANGIOGRAPHY N/A 01/30/2020   Procedure: RIGHT/LEFT HEART CATH AND CORONARY ANGIOGRAPHY;  Surgeon: Elder Negus, MD;  Location: [facility] INVASIVE CV LAB;  Service: Cardiovascular;  Laterality: N/A;   Social History:  reports that he does not have a smoking history on file. He has quit using smokeless tobacco. He reports that he does not drink alcohol and does not use drugs.  Allergies  Allergen Reactions   Acetaminophen Nausea And Vomiting   Lidocaine Rash   Tylenol [Acetaminophen] Hives    Family History  Problem Relation Age of Onset   Cancer Mother    Diabetes Mother    Heart disease Father     Prior to Admission medications   Medication Sig Start Date End Date Taking? Authorizing Provider  albuterol (PROVENTIL HFA;VENTOLIN HFA) 108 (90 Base) MCG/ACT inhaler Inhale 2 puffs into the lungs 3 (three) times daily. 09/18/15   [provider]  aspirin EC 81 MG tablet Take 81 mg by mouth.    [provider]  atorvastatin (LIPITOR) 80 MG tablet Take 1 tablet (80 mg total) by mouth daily. 02/04/20   Orpah Cobb, MD  atorvastatin (LIPITOR) 80 MG tablet Take 80 mg by mouth every morning. 07/12/21   [provider]  budesonide-formoterol (SYMBICORT) 160-4.5 MCG/ACT inhaler Inhale 1 puff into the lungs 2 (two) times daily. 09/30/16 11/06/20  [provider]  bumetanide (BUMEX) 2 MG tablet Take 2 mg by mouth 2 (two) times daily. 1 TABLET (2 MG TOTAL)  BY MOUTH DAILY AND 0.5 TABLETS (1 MG TOTAL) DAILY WITH LUNCH 07/05/21   [provider]  buPROPion (WELLBUTRIN XL) 300 MG 24 hr tablet Take 300 mg by mouth daily. 07/09/21   [provider]  carvedilol (COREG) 3.125 MG tablet Take 1 tablet (3.125 mg total) by mouth 2 (two) times daily with a meal. 02/03/20   Orpah Cobb, MD  carvedilol (COREG) 3.125 MG tablet Take 3.125 mg by mouth 2 (two) times daily. 07/17/21   [provider]  clopidogrel (PLAVIX) 75 MG tablet Take 75 mg by mouth every morning. 10/08/20   [provider]  docusate sodium (COLACE) 100 MG capsule Take 1 capsule (100 mg total) by mouth 2 (two) times daily. 02/03/20   Orpah Cobb, MD  folic acid (FOLVITE) 1 MG tablet Take 1 tablet (1 mg total) by mouth daily. 02/04/20   Orpah Cobb, MD  folic acid (FOLVITE) 1 MG tablet Take 1 mg by mouth daily. 07/03/21   [provider]  furosemide (LASIX) 40 MG tablet Take 80 mg by mouth daily. 09/25/20   [provider]  furosemide (LASIX) 40 MG tablet Take 80 mg by mouth daily. 04/15/21   [provider]  gabapentin (NEURONTIN) 300 MG capsule Take 300 mg by mouth 3 (three) times daily. 02/08/20 02/07/21  [provider]  gabapentin (NEURONTIN) 300 MG capsule Take 300 mg by mouth 3 (three) times daily. 05/30/21   [provider]  magnesium oxide (MAG-OX) 400 MG tablet Take 1 tablet by mouth daily. 06/15/21   [provider]  nicotine (NICODERM CQ - DOSED IN MG/24 HOURS) 21 mg/24hr patch 21 mg daily. 07/09/21   [provider]  omeprazole (PRILOSEC) 20 MG capsule Take 20 mg by mouth daily as needed (Acid reflux). 09/18/15   [provider]  oxyCODONE (OXY IR/ROXICODONE) 5 MG immediate release tablet Take 10 mg by mouth every 8 (eight) hours as needed. 07/17/21   [provider]  polyethylene glycol (MIRALAX / GLYCOLAX) 17 g packet Take 17 g by mouth daily. 02/04/20   Orpah Cobb, MD   potassium chloride (KLOR-CON) 10 MEQ tablet Take 1 tablet (10 mEq total) by mouth daily. 02/04/20   Orpah Cobb, MD  sacubitril-valsartan (ENTRESTO) 24-26 MG Take 1 tablet by mouth 2 (two) times daily. 02/03/20   Orpah Cobb, MD  SPIRIVA RESPIMAT 2.5 MCG/ACT AERS Inhale 2 puffs into the lungs daily. 10/08/20   [provider]  SPIRIVA RESPIMAT 2.5 MCG/ACT AERS Inhale 2 puffs into the lungs daily. 07/20/21   [provider]  spironolactone (ALDACTONE) 25 MG tablet Take 1 tablet (25 mg total) by mouth daily. 02/04/20   Orpah Cobb, MD  SYMBICORT 160-4.5 MCG/ACT inhaler Inhale into the lungs. 07/20/21   [provider]  tamsulosin (FLOMAX) 0.4 MG CAPS capsule Take 0.4 mg by mouth daily. 07/17/21   [provider]  thiamine 100 MG tablet Take 1 tablet (100 mg total) by mouth daily. Patient not taking: Reported on 11/06/2020 02/04/20  Orpah Cobb, MD  thiamine 100 MG tablet Take 100 mg by mouth daily. 07/19/21   [provider]  VENTOLIN HFA 108 (90 Base) MCG/ACT inhaler Inhale 2 puffs into the lungs every 6 (six) hours as needed. 06/01/21   [provider]    Physical Exam: Vitals:   01/02/23 0730 01/02/23 0937 01/02/23 1000 01/02/23 1030  BP: 112/74  94/68 111/85  Pulse: 83  88 84  Resp: (!) 26  (!) 26 (!) 35  Temp:  98.2 F (36.8 C)    TempSrc:  Oral    SpO2: 97%  93% 96%  Weight:      Height:       Constitutional: NAD, calm, comfortable Eyes: PERRL, lids and conjunctivae normal ENMT: Mucous membranes are moist. Posterior pharynx clear of any exudate or lesions. Neck: normal, supple, no masses, no thyromegaly Respiratory: clear to auscultation bilaterally, no wheezing, no crackles. No accessory muscle use.  Speaking in short sentences on bipap. Cardiovascular: Regular rate and rhythm, no murmurs / rubs / gallops. No extremity edema. 2+ radial and pedal pulses.  Abdomen: Obese no tenderness to palpation, umbilical hernia noted. No  hepatosplenomegaly. Bowel sounds positive.  Musculoskeletal: No joint deformity upper and lower extremities. Good ROM, no contractures. Normal muscle tone.  Skin: Abrasions noted to (R) arm. Neurologic:  Alert and oriented x 3.    Data Reviewed: CBC    Component Value Date/Time   WBC 7.6 01/02/2023 0153   RBC 4.36 01/02/2023 0153   HGB 14.7 01/02/2023 0153   HGB 14.4 04/04/2011 1047   HCT 46.4 01/02/2023 0153   HCT 42.3 04/04/2011 1047   PLT 247 01/02/2023 0153   PLT 236 04/04/2011 1047   MCV 106.4 (H) 01/02/2023 0153   MCV 99 04/04/2011 1047   MCH 33.7 01/02/2023 0153   MCHC 31.7 01/02/2023 0153   RDW 13.3 01/02/2023 0153   RDW 13.4 04/04/2011 1047   LYMPHSABS 0.7 01/02/2023 0153   MONOABS 0.5 01/02/2023 0153   EOSABS 0.1 01/02/2023 0153   BASOSABS 0.0 01/02/2023 0153      Latest Ref Rng & Units 01/02/2023    1:53 AM 07/27/2021    4:43 AM 07/26/2021    5:38 AM  BMP  Glucose 70 - 99 mg/dL 324  401  027   BUN 6 - 20 mg/dL 7  16  15    Creatinine 0.61 - 1.24 mg/dL 2.53  6.64  4.03   Sodium 135 - 145 mmol/L 136  141  139   Potassium 3.5 - 5.1 mmol/L 4.2  3.9  4.3   Chloride 98 - 111 mmol/L 98  97  98   CO2 22 - 32 mmol/L 30  41  36   Calcium 8.9 - 10.3 mg/dL 9.0  8.7  8.7    Venous Blood Gas result: pCO2 85; pH 7.22;  HCO3 34.8 01/02/2023 0153  Cardiac Panel (last 3 results) Recent Labs    01/02/23 0153 01/02/23 0322  TROPONINIHS 118* 104*   BNP    Component Value Date/Time   BNP 424.4 (H) 01/02/2023 0153    PT/INR    Component Value Date/Time   PROTIME-INR APTT 15.1 1.2 01/02/2023 0347 01/02/2023 0347   APTT    Component Value Date/Time   APTT 159 01/02/2023 0347      Assessment and Plan: #Acute on Chronic Respiratory Failure with hypoxia and hypercapnia - Diurese as below - Supplemenetal O2 for SPO2 goal >92%; Wean bipap - Repeat VBG  #Acute  on chronic systolic CHF - 40 mg IV Lasix x1 this evening; redose as needed - Strict I/O -  fluid restriction - Cardiology consulted, appreciate their recommendations and management  #NSTEMI #CAD s/p PCI #Hyperlipidemia - Continue IV heparin - Continue home aspirin, atorvastatin, plavix - On Jardiance and Metoprolol outpatient, Per [facility] notes plan to re-introduce GDMT meds at subsequent follow up visits. No follow up visits seen in CareEverywhere. - Cardiology consulted, appreciate their recommendations and management  #COPD Exacerbation - Scheduled Spiriva and Symbicort - PRN Duonebs - Check COVID, Flu, RSV, and Respiratory 20 Pathogen panel - Supplemental O2 as needed - Pulmonary toilet: Mobilize/incentive spirometry/flutter valve   #Chronic Pain Syndrome PDMP reviewed - PRN Oxycodone  #Depression - Continue home bupropion  #GERD -Protonix  #Tobacco Use Disorder -NRT while inpatient -Counseled on cessation  VTE prophylaxis: IV Heparin  GI prophylaxis: Protonix Diet: Heart Healthy Access: PIC Lines: NONE Code Status: Full Telemetry: Yes Disposition: Admit to Progressive  Advance Care Planning:   Code Status: Full Code   Consults: [facility]  Family Communication: Patient requested I update girlfriend Debbie. Debbie updated via phone.  Severity of Illness: The appropriate patient status for this patient is INPATIENT. Inpatient status is judged to be reasonable and necessary in order to provide the required intensity of service to ensure the patient's safety. The patient's presenting symptoms, physical exam findings, and initial radiographic and laboratory data in the context of their chronic comorbidities is felt to place them at high risk for further clinical deterioration. Furthermore, it is not anticipated that the patient will be medically stable for discharge from the hospital within 2 midnights of admission.   * I certify that at the point of admission it is my clinical judgment that the patient will require inpatient hospital care  spanning beyond 2 midnights from the point of admission due to high intensity of service, high risk for further deterioration and high frequency of surveillance required.*  To reach the provider On-Call:   7AM- 7PM see care teams to locate the attending and reach out to them via [email or web address] Password: [email or web address] 7PM-7AM contact night-coverage If you still have difficulty reaching the appropriate provider, please page the [facility] (Director on Call) for [facility] on amion for assistance  This document was prepared using Conservation officer, historic buildings and may include unintentional dictation errors.  Bishop Limbo FNP-BC, PMHNP-BC Nurse Practitioner [facility]

## 2023-01-02 NOTE — Progress Notes (Signed)
ANTICOAGULATION CONSULT NOTE  Pharmacy Consult for heparin infusion Indication: ACS/STEMI  Allergies  Allergen Reactions   Acetaminophen Nausea And Vomiting   Lidocaine Rash   Tylenol [Acetaminophen] Hives    Patient Measurements: Height: 6\' 1"  (185.4 cm) Weight: 130.8 kg (288 lb 5.8 oz) IBW/kg (Calculated) : 79.9 Heparin Dosing Weight: 109.2 kg  Vital Signs: Temp: 98.4 F (36.9 C) (11/14 0146) Temp Source: Oral (11/14 0146) BP: 142/92 (11/14 0146) Pulse Rate: 103 (11/14 0146)  Labs: Recent Labs    01/02/23 0153  HGB 14.7  HCT 46.4  PLT 247  CREATININE 0.93  TROPONINIHS 118*    Estimated Creatinine Clearance: 121.3 mL/min (by C-G formula based on SCr of 0.93 mg/dL).   Medical History: Past Medical History:  Diagnosis Date   Arthritis    COPD (chronic obstructive pulmonary disease) (HCC)    Depression    Diverticulitis    Hyperlipidemia    Hypertension    Stroke (HCC) 2016   confirmed by CT scan     Assessment: Pt is a 57 yo male presenting to ED c/o SOB, found with elevated troponin I and BNP levels.  Goal of Therapy:  Heparin level 0.3-0.7 units/ml Monitor platelets by anticoagulation protocol: Yes   Plan:  Bolus 4000 units x 1 Start heparin infusion at 1450 units/hr Will check HL in 6 hr after start of infusion CBC daily while on heparin  Otelia Sergeant, PharmD, [name] 01/02/2023 3:05 AM

## 2023-01-02 NOTE — Progress Notes (Signed)
PHARMACIST - PHYSICIAN COMMUNICATION  CONCERNING:  Enoxaparin (Lovenox) for DVT Prophylaxis    RECOMMENDATION: Patient was prescribed enoxaprin for VTE prophylaxis.   Filed Weights   01/02/23 0149 01/02/23 1617  Weight: 130.8 kg (288 lb 5.8 oz) 124.2 kg (273 lb 12.8 oz)    Body mass index is 36.12 kg/m.  Estimated Creatinine Clearance: 118.1 mL/min (by C-G formula based on SCr of 0.93 mg/dL).   Based on [facility] policy patient is candidate for enoxaparin 0.5mg /kg TBW SQ every 24 hours based on BMI being >30.  DESCRIPTION: Pharmacy has adjusted enoxaparin dose per [facility] policy.  Patient is now receiving enoxaparin 0.5 mg/kg every 24 hours    Lowella Bandy, PharmD Clinical Pharmacist  01/02/2023 5:02 PM

## 2023-01-02 NOTE — Consult Note (Signed)
[facility] CLINIC CARDIOLOGY CONSULT NOTE       Patient ID: Tommy Gaines MRN: [id number] DOB/AGE: [birth year] 59 y.o.  Admit date: 01/02/2023 Referring Physician Bishop Limbo, NP Primary Physician Pcp, No  Primary Cardiologist Dr. Gwynne Edinger [facility] - seen by [facility] Cards during prior admissions) Reason for Consultation acute heart failure  HPI: Tommy Gaines is a 57 y.o. male  with a past medical history of chronic HFrEF (LVEF 35-40%), PDA S/P repair at 57yo, CAD and prior MI S/P PCI 2021, COPD, HLD who presented to the ED on 01/02/2023 for shortness of breath and difficulty breathing. Cardiology was consulted for further evaluation.   Patient reports that over the last few days he has noticed progressively worsening shortness of breath and difficulty breathing.  He tried to do home albuterol treatments earlier this a.m. without improvement, called EMS at that time.  Brought to the ED for further evaluation.  Workup in the ED notable for creatinine 0.93, potassium 4.2, hemoglobin 14.7, WBC 7.6.  BNP mildly elevated at 424.  pCO2 on VBG 85.  Troponins trended 118 > 104.  He was started on IV Lasix, nebulizer treatments and steroids in the ED.  Also placed on BiPAP.  At the time my evaluation this afternoon patient is resting in ED stretcher.  States that shortness of breath is improved from earlier this morning.  He has been weaned to 2 L [place] which is his baseline at home.  He denies any episodes of chest pain or pressure today or recently.  States that he has felt like he has been wheezing lately.  Main complaint is shortness of breath.  Otherwise he has no complaints.  States he has a history of heart failure and has been hospitalized for this in the past requiring diuresis.  States that he has not been compliant with all of his heart failure medications at home.  Review of systems complete and found to be negative unless listed above    Past Medical History:  Diagnosis Date    Arthritis    COPD (chronic obstructive pulmonary disease) (HCC)    Depression    Diverticulitis    Hyperlipidemia    Hypertension    Stroke (HCC) 2016   confirmed by CT scan     Past Surgical History:  Procedure Laterality Date   CORONARY STENT INTERVENTION N/A 01/30/2020   Procedure: CORONARY STENT INTERVENTION;  Surgeon: Elder Negus, MD;  Location: [facility] INVASIVE CV LAB;  Service: Cardiovascular;  Laterality: N/A;   CORONARY STENT INTERVENTION N/A 02/01/2020   Procedure: CORONARY STENT INTERVENTION;  Surgeon: Yates Decamp, MD;  Location: [facility] INVASIVE CV LAB;  Service: Cardiovascular;  Laterality: N/A;   intestinal rupture  2012   colostomy with take down as a result of this surgery.  colostomy reversed 6 months post surgery.   JOINT REPLACEMENT Bilateral    shoulders   RIGHT/LEFT HEART CATH AND CORONARY ANGIOGRAPHY N/A 01/30/2020   Procedure: RIGHT/LEFT HEART CATH AND CORONARY ANGIOGRAPHY;  Surgeon: Elder Negus, MD;  Location: [facility] INVASIVE CV LAB;  Service: Cardiovascular;  Laterality: N/A;    (Not in a hospital admission)  Social History   Socioeconomic History   Marital status: Single    Spouse name: Not on file   Number of children: 1   Years of education: Not on file   Highest education level: 11th grade  Occupational History   Occupation: Disability  Tobacco Use   Smoking status: Every Day    Current  packs/day: 0.25    Average packs/day: 0.3 packs/day for 15.0 years (3.8 ttl pk-yrs)    Types: Cigarettes   Smokeless tobacco: Former   Tobacco comments:    He smokes 2-4 cigarettes a day  Vaping Use   Vaping status: Never Used  Substance and Sexual Activity   Alcohol use: Yes    Alcohol/week: 2.0 standard drinks of alcohol    Types: 1 Glasses of wine, 1 Shots of liquor per week    Comment: 2 -3 times a week   Drug use: Yes    Types: Marijuana   Sexual activity: Not on file  Other Topics Concern   Not on file  Social History Narrative   ** Merged  History Encounter **       Social Determinants of Health   Financial Resource Strain: Low Risk  (01/02/2023)   Overall Financial Resource Strain (CARDIA)    Difficulty of Paying Living Expenses: Not hard at all  Food Insecurity: No Food Insecurity (01/02/2023)   Hunger Vital Sign    Worried About Running Out of Food in the Last Year: Never true    Ran Out of Food in the Last Year: Never true  Transportation Needs: No Transportation Needs (01/02/2023)   PRAPARE - Administrator, Civil Service (Medical): No    Lack of Transportation (Non-Medical): No  Physical Activity: Not on file  Stress: Not on file  Social Connections: Not on file  Intimate Partner Violence: Not At Risk (01/02/2023)   Humiliation, Afraid, Rape, and Kick questionnaire    Fear of Current or Ex-Partner: No    Emotionally Abused: No    Physically Abused: No    Sexually Abused: No    Family History  Problem Relation Age of Onset   Cancer Mother    Diabetes Mother    Heart disease Father      Vitals:   01/02/23 1415 01/02/23 1430 01/02/23 1445 01/02/23 1500  BP:   130/79 (!) 145/121  Pulse: 99 (!) 103  (!) 102  Resp:   20   Temp:      TempSrc:      SpO2: 93% 94%  91%  Weight:      Height:        PHYSICAL EXAM General: Ill appearing male, well nourished, in no acute distress sitting upright in ED stretcher. HEENT: Normocephalic and atraumatic. Neck: No JVD.  Lungs: Normal respiratory effort on 2 L [place] (baseline). Clear bilaterally to auscultation. Bilateral wheezing.  Heart: HRRR. Normal S1 and S2 without gallops or murmurs.  Abdomen: Non-distended appearing.  Msk: Normal strength and tone for age. Extremities: Warm and well perfused. No clubbing, cyanosis. Trace edema.  Neuro: Alert and oriented X 3. Psych: Answers questions appropriately.   Labs: Basic Metabolic Panel: Recent Labs    01/02/23 0153  NA 136  K 4.2  CL 98  CO2 30  GLUCOSE 118*  BUN 7  CREATININE 0.93  CALCIUM  9.0   Liver Function Tests: No results for input(s): "AST", "ALT", "ALKPHOS", "BILITOT", "PROT", "ALBUMIN" in the last 72 hours. No results for input(s): "LIPASE", "AMYLASE" in the last 72 hours. CBC: Recent Labs    01/02/23 0153  WBC 7.6  NEUTROABS 6.2  HGB 14.7  HCT 46.4  MCV 106.4*  PLT 247   Cardiac Enzymes: Recent Labs    01/02/23 0153 01/02/23 0322  TROPONINIHS 118* 104*   BNP: Recent Labs    01/02/23 0153  BNP 424.4*  D-Dimer: No results for input(s): "DDIMER" in the last 72 hours. Hemoglobin A1C: No results for input(s): "HGBA1C" in the last 72 hours. Fasting Lipid Panel: No results for input(s): "CHOL", "HDL", "LDLCALC", "TRIG", "CHOLHDL", "LDLDIRECT" in the last 72 hours. Thyroid Function Tests: No results for input(s): "TSH", "T4TOTAL", "T3FREE", "THYROIDAB" in the last 72 hours.  Invalid input(s): "FREET3" Anemia Panel: No results for input(s): "VITAMINB12", "FOLATE", "FERRITIN", "TIBC", "IRON", "RETICCTPCT" in the last 72 hours.   Radiology: DG Chest Portable 1 View  Result Date: 01/02/2023 CLINICAL DATA:  Shortness of breath. EXAM: PORTABLE CHEST 1 VIEW COMPARISON:  November 05, 2020 FINDINGS: The cardiac silhouette is mildly enlarged and unchanged in size. Mild prominence of the central pulmonary vasculature is seen without evidence of overt pulmonary edema. Mild stable atelectatic changes are seen within the mid left lung and bilateral lung bases. No pleural effusion or pneumothorax is identified. Bilateral shoulder replacements are seen. Multilevel degenerative changes are noted throughout the thoracic spine. IMPRESSION: 1. Mild cardiomegaly and central pulmonary vascular congestion without overt pulmonary edema. 2. Mild stable bilateral atelectasis. Electronically Signed   By: Aram Candela M.D.   On: 01/02/2023 02:16    ECHO pending  TELEMETRY reviewed by me 01/02/2023: Sinus rhythm rate 90s  EKG reviewed by me: sinus tachycardia rate 107  bpm, nonacute  Data reviewed by me 01/02/2023: last 24h vitals tele labs imaging I/O ED provider note, admission H&P  Principal Problem:   Acute on chronic respiratory failure with hypoxia and hypercapnia (HCC) Active Problems:   COPD exacerbation (HCC)   Acute on chronic systolic congestive heart failure (HCC)    ASSESSMENT AND PLAN:  Tommy Gaines is a 57 y.o. male  with a past medical history of chronic HFrEF (LVEF 35-40%), PDA S/P repair at 57yo, CAD and prior MI S/P PCI 2021, COPD, HLD who presented to the ED on 01/02/2023 for shortness of breath and difficulty breathing. Cardiology was consulted for further evaluation.   # Acute on chronic HFrEF (35-40% in 02/2022) # Acute on chronic hypoxic respiratory failure  # Acute COPD exacerbation # Demand ischemia Patient presented with worsening shortness of breath over the last few days.  Found to be hypoxic by EMS and was started on BiPAP initially.  BNP mildly elevated at 424, VBG with pCO2 of 85.  Troponins trended 118 > 104.  EKG with sinus tachycardia, nonacute.  S/p nebulizer treatments, steroids, IV Lasix in the ED.  Now weaned to 2L  (baseline).  Suspect symptoms are multifactorial, primarily related to COPD exacerbation also with mild heart failure exacerbation. -Continue IV diuresis.  Continue to monitor renal function closely.  Previously taking carvedilol, Jardiance at home.  Was previously prescribed Entresto and spironolactone but reportedly has not been taking these medications. -Plan to restart GDMT as BP and renal function will tolerate. -Mild and flat troponin elevation most consistent with demand/supply mismatch and not ACS in the setting of acute heart failure, acute respiratory failure.  -No plan for further cardiac diagnostics.  -Management of COPD per primary.  #CAD s/p DES to LAD > D1 2021 #Hyperlipidemia Patient with a history of coronary artery disease, underwent stenting in 2021.  Troponins mildly elevated  and flat this admission likely secondary to acute respiratory failure and acute heart failure.  Patient is without chest pain, denies any recent episodes as well. -Continue atorvastatin 80 mg daily, aspirin 81 mg daily, Plavix 75 mg daily.  This patient's plan of care was discussed and created with Dr.  Alluri and he is in agreement.  Signed: Gale Journey, PA-C  01/02/2023, 3:30 PM [facility] Cardiology

## 2023-01-02 NOTE — Progress Notes (Signed)
Pt taken off bipap and placed 2lpm [place], sats 98%, tolerating well at this time RN aware.

## 2023-01-02 NOTE — ED Provider Notes (Signed)
[facility] Provider Note    Event Date/Time   First MD Initiated Contact with Patient 01/02/23 0144     (approximate)   History   Shortness of Breath   HPI  Tommy Gaines is a 57 y.o. male with history of COPD on 3 L oxygen chronically, CHF, hypertension, hyperlipidemia, CVA who presents to the emergency department shortness of breath that started this afternoon with wheezing.  Hypoxic to 88% on 3 L with EMS.  They gave 2 DuoNebs, 125 mg of IV Solu-Medrol.  Patient denies chest pain.  No fever.  No lower extremity swelling or discomfort.   History provided by patient.    Past Medical History:  Diagnosis Date   Arthritis    COPD (chronic obstructive pulmonary disease) (HCC)    Depression    Diverticulitis    Hyperlipidemia    Hypertension    Stroke (HCC) 2016   confirmed by CT scan     Past Surgical History:  Procedure Laterality Date   CORONARY STENT INTERVENTION N/A 01/30/2020   Procedure: CORONARY STENT INTERVENTION;  Surgeon: Elder Negus, MD;  Location: [facility] INVAS[facility] CV LAB;  Service: Cardiovascular;  Laterality: N/A;   CORONARY STENT INTERVENTION N/A 02/01/2020   Procedure: CORONARY STENT INTERVENTION;  Surgeon: Yates Decamp, MD;  Location: [facility] INVASIVE CV LAB;  Service: Cardiovascular;  Laterality: N/A;   intestinal rupture  2012   colostomy with take down as a result of this surgery.  colostomy reversed 6 months post surgery.   JOINT REPLACEMENT Bilateral    shoulders   RIGHT/LEFT HEART CATH AND CORONARY ANGIOGRAPHY N/A 01/30/2020   Procedure: RIGHT/LEFT HEART CATH AND CORONARY ANGIOGRAPHY;  Surgeon: Elder Negus, MD;  Location: [facility] INVASIVE CV LAB;  Service: Cardiovascular;  Laterality: N/A;    MEDICATIONS:  Prior to Admission medications   Medication Sig Start Date End Date Taking? Authorizing Provider  albuterol (PROVENTIL HFA;VENTOLIN HFA) 108 (90 Base) MCG/ACT inhaler Inhale 2 puffs into the lungs 3 (three)  times daily. 09/18/15   [provider]  aspirin EC 81 MG tablet Take 81 mg by mouth.    [provider]  atorvastatin (LIPITOR) 80 MG tablet Take 1 tablet (80 mg total) by mouth daily. 02/04/20   Orpah Cobb, MD  atorvastatin (LIPITOR) 80 MG tablet Take 80 mg by mouth every morning. 07/12/21   [provider]  budesonide-formoterol (SYMBICORT) 160-4.5 MCG/ACT inhaler Inhale 1 puff into the lungs 2 (two) times daily. 09/30/16 11/06/20  [provider]  bumetanide (BUMEX) 2 MG tablet Take 2 mg by mouth 2 (two) times daily. 1 TABLET (2 MG TOTAL) BY MOUTH DAILY AND 0.5 TABLETS (1 MG TOTAL) DAILY WITH LUNCH 07/05/21   [provider]  buPROPion (WELLBUTRIN XL) 300 MG 24 hr tablet Take 300 mg by mouth daily. 07/09/21   [provider]  carvedilol (COREG) 3.125 MG tablet Take 1 tablet (3.125 mg total) by mouth 2 (two) times daily with a meal. 02/03/20   Orpah Cobb, MD  carvedilol (COREG) 3.125 MG tablet Take 3.125 mg by mouth 2 (two) times daily. 07/17/21   [provider]  clopidogrel (PLAVIX) 75 MG tablet Take 75 mg by mouth every morning. 10/08/20   [provider]  docusate sodium (COLACE) 100 MG capsule Take 1 capsule (100 mg total) by mouth 2 (two) times daily. 02/03/20   Orpah Cobb, MD  folic acid (FOLVITE) 1 MG tablet Take 1 tablet (1 mg total) by mouth daily.  02/04/20   Orpah Cobb, MD  folic acid (FOLVITE) 1 MG tablet Take 1 mg by mouth daily. 07/03/21   [provider]  furosemide (LASIX) 40 MG tablet Take 80 mg by mouth daily. 09/25/20   [provider]  furosemide (LASIX) 40 MG tablet Take 80 mg by mouth daily. 04/15/21   [provider]  gabapentin (NEURONTIN) 300 MG capsule Take 300 mg by mouth 3 (three) times daily. 02/08/20 02/07/21  [provider]  gabapentin (NEURONTIN) 300 MG capsule Take 300 mg by mouth 3 (three) times daily. 05/30/21   [provider]  magnesium oxide  (MAG-OX) 400 MG tablet Take 1 tablet by mouth daily. 06/15/21   [provider]  nicotine (NICODERM CQ - DOSED IN MG/24 HOURS) 21 mg/24hr patch 21 mg daily. 07/09/21   [provider]  omeprazole (PRILOSEC) 20 MG capsule Take 20 mg by mouth daily as needed (Acid reflux). 09/18/15   [provider]  oxyCODONE (OXY IR/ROXICODONE) 5 MG immediate release tablet Take 10 mg by mouth every 8 (eight) hours as needed. 07/17/21   [provider]  polyethylene glycol (MIRALAX / GLYCOLAX) 17 g packet Take 17 g by mouth daily. 02/04/20   Orpah Cobb, MD  potassium chloride (KLOR-CON) 10 MEQ tablet Take 1 tablet (10 mEq total) by mouth daily. 02/04/20   Orpah Cobb, MD  sacubitril-valsartan (ENTRESTO) 24-26 MG Take 1 tablet by mouth 2 (two) times daily. 02/03/20   Orpah Cobb, MD  SPIRIVA RESPIMAT 2.5 MCG/ACT AERS Inhale 2 puffs into the lungs daily. 10/08/20   [provider]  SPIRIVA RESPIMAT 2.5 MCG/ACT AERS Inhale 2 puffs into the lungs daily. 07/20/21   [provider]  spironolactone (ALDACTONE) 25 MG tablet Take 1 tablet (25 mg total) by mouth daily. 02/04/20   Orpah Cobb, MD  SYMBICORT 160-4.5 MCG/ACT inhaler Inhale into the lungs. 07/20/21   [provider]  tamsulosin (FLOMAX) 0.4 MG CAPS capsule Take 0.4 mg by mouth daily. 07/17/21   [provider]  thiamine 100 MG tablet Take 1 tablet (100 mg total) by mouth daily. Patient not taking: Reported on 11/06/2020 02/04/20   Orpah Cobb, MD  thiamine 100 MG tablet Take 100 mg by mouth daily. 07/19/21   [provider]  VENTOLIN HFA 108 (90 Base) MCG/ACT inhaler Inhale 2 puffs into the lungs every 6 (six) hours as needed. 06/01/21   [provider]    Physical Exam   Triage Vital Signs: ED Triage Vitals  Encounter Vitals Group     BP 01/02/23 0146 (!) 142/92     Systolic BP Percentile --      Diastolic BP Percentile --      Pulse Rate 01/02/23 0146 (!) 103      Resp 01/02/23 0146 (!) 25     Temp 01/02/23 0146 98.4 F (36.9 C)     Temp Source 01/02/23 0146 Oral     SpO2 01/02/23 0146 100 %     Weight 01/02/23 0149 288 lb 5.8 oz (130.8 kg)     Height 01/02/23 0149 6\' 1"  (1.854 m)     Head Circumference --      Peak Flow --      Pain Score 01/02/23 0148 6     Pain Loc --      Pain Education --      Exclude from Growth Chart --     Most recent vital signs: Vitals:   01/02/23 0146  BP: Marland Kitchen)  142/92  Pulse: (!) 103  Resp: (!) 25  Temp: 98.4 F (36.9 C)  SpO2: 100%    CONSTITUTIONAL: Alert, responds appropriately to questions.  Obese, chronically ill-appearing HEAD: Normocephalic, atraumatic EYES: Conjunctivae clear, pupils appear equal, sclera nonicteric ENT: normal nose; moist mucous membranes NECK: Supple, normal ROM CARD: Regular and tachycardic; S1 and S2 appreciated RESP: Increased work of breathing, tachypnea, diffuse inspiratory and expiratory wheezes, no rhonchi or rales, speaking in truncated sentences ABD/GI: Non-distended; soft, non-tender, no rebound, no guarding, no peritoneal signs BACK: The back appears normal EXT: Normal ROM in all joints; no deformity noted, no edema, no calf tenderness or calf swelling SKIN: Normal color for age and race; warm; no rash on exposed skin NEURO: Moves all extremities equally, normal speech PSYCH: The patient's mood and manner are appropriate.   ED Results / Procedures / Treatments   LABS: (all labs ordered are listed, but only abnormal results are displayed) Labs Reviewed  CBC WITH DIFFERENTIAL/PLATELET - Abnormal; Notable for the following components:      Result Value   MCV 106.4 (*)    All other components within normal limits  BASIC METABOLIC PANEL - Abnormal; Notable for the following components:   Glucose, Bld 118 (*)    All other components within normal limits  BLOOD GAS, VENOUS - Abnormal; Notable for the following components:   pH, Ven 7.22 (*)    pCO2, Ven 85 (*)     Bicarbonate 34.8 (*)    Acid-Base Excess 4.1 (*)    All other components within normal limits  BRAIN NATRIURETIC PEPTIDE - Abnormal; Notable for the following components:   B Natriuretic Peptide 424.4 (*)    All other components within normal limits  TROPONIN I (HIGH SENSITIVITY) - Abnormal; Notable for the following components:   Troponin I (High Sensitivity) 118 (*)    All other components within normal limits     EKG:  EKG Interpretation Date/Time:  Thursday January 02 2023 02:46:55 EST Ventricular Rate:  107 PR Interval:  188 QRS Duration:  101 QT Interval:  359 QTC Calculation: 479 R Axis:   -68  Text Interpretation: Sinus tachycardia Left anterior fascicular block Anterior infarct, old Baseline wander in lead(s) V3 Confirmed by Rochele Raring [phone]) on 01/02/2023 2:56:47 AM         RADIOLOGY: My personal review and interpretation of imaging: Chest x-ray shows no edema, infiltrate.  I have personally reviewed all radiology reports.   DG Chest Portable 1 View  Result Date: 01/02/2023 CLINICAL DATA:  Shortness of breath. EXAM: PORTABLE CHEST 1 VIEW COMPARISON:  November 05, 2020 FINDINGS: The cardiac silhouette is mildly enlarged and unchanged in size. Mild prominence of the central pulmonary vasculature is seen without evidence of overt pulmonary edema. Mild stable atelectatic changes are seen within the mid left lung and bilateral lung bases. No pleural effusion or pneumothorax is identified. Bilateral shoulder replacements are seen. Multilevel degenerative changes are noted throughout the thoracic spine. IMPRESSION: 1. Mild cardiomegaly and central pulmonary vascular congestion without overt pulmonary edema. 2. Mild stable bilateral atelectasis. Electronically Signed   By: Aram Candela M.D.   On: 01/02/2023 02:16     PROCEDURES:  Critical Care performed: Yes, see critical care procedure note(s)   CRITICAL CARE Performed by: Baxter Hire Terrill Wauters   Total critical  care time: 35 minutes  Critical care time was exclusive of separately billable procedures and treating other patients.  Critical care was necessary to treat or prevent imminent or life-threatening  deterioration.  Critical care was time spent personally by me on the following activities: development of treatment plan with patient and/or surrogate as well as nursing, discussions with consultants, evaluation of patient's response to treatment, examination of patient, obtaining history from patient or surrogate, ordering and performing treatments and interventions, ordering and review of laboratory studies, ordering and review of radiographic studies, pulse oximetry and re-evaluation of patient's condition.   Marland Kitchen1-3 Lead EKG Interpretation  Performed by: Jeremie Giangrande, Layla Maw, DO Authorized by: French Kendra, Layla Maw, DO     Interpretation: abnormal     ECG rate:  103   ECG rate assessment: tachycardic     Rhythm: sinus tachycardia     Ectopy: none     Conduction: normal       IMPRESSION / MDM / ASSESSMENT AND PLAN / ED COURSE  I reviewed the triage vital signs and the nursing notes.    Patient here with shortness of breath, wheezing, hypoxia on his 3 L nasal cannula.  The patient is on the cardiac monitor to evaluate for evidence of arrhythmia and/or significant heart rate changes.   DIFFERENTIAL DIAGNOSIS (includes but not limited to):   COPD exacerbation, CHF exacerbation, pneumonia, viral URI, PE   Patient's presentation is most consistent with acute presentation with potential threat to life or bodily function.   PLAN: Will continue breathing treatments.  Will place on BiPAP.  Will obtain VBG, labs, chest x-ray.  Patient will need admission.   MEDICATIONS GIVEN IN ED: Medications  albuterol (PROVENTIL) (2.5 MG/3ML) 0.083% nebulizer solution 5 mg (has no administration in time range)  aspirin chewable tablet 324 mg (has no administration in time range)  furosemide (LASIX) injection 40  mg (has no administration in time range)  ipratropium-albuterol (DUONEB) 0.5-2.5 (3) MG/3ML nebulizer solution 3 mL (3 mLs Nebulization Given 01/02/23 0202)     ED COURSE: Labs show no leukocytosis, normal hemoglobin.  VBG shows pH of 7.22 with a CO2 of 85 and a bicarb of 34.8.  He is currently on BiPAP.  Chest x-ray reviewed and interpreted by myself and the radiologist and shows pulmonary vascular congestion without edema, infiltrate.   Troponin is elevated to 119 which could be from demand ischemia.  Patient denies any chest pain and his EKG is nonischemic.  Will give aspirin, start heparin for NSTEMI.  Will also give IV Lasix given history of CHF with pulmonary vascular congestion and BNP elevated greater than 400.   Patient clinically improving on BiPAP.  CONSULTS:  Consulted and discussed patient's case with hospitalist, Dr. Arville Care.  I have recommended admission and consulting physician agrees and will place admission orders.  Patient (and family if present) agree with this plan.   I reviewed all nursing notes, vitals, pertinent previous records.  All labs, EKGs, imaging ordered have been independently reviewed and interpreted by myself.    OUTSIDE RECORDS REVIEWED: Reviewed last admission in June 2023 for CHF exacerbation.       FINAL CLINICAL IMPRESSION(S) / ED DIAGNOSES   Final diagnoses:  COPD exacerbation (HCC)  Acute on chronic systolic congestive heart failure (HCC)  NSTEMI (non-ST elevated myocardial infarction) (HCC)     Rx / DC Orders   ED Discharge Orders     None        Note:  This document was prepared using Dragon voice recognition software and may include unintentional dictation errors.   Dawnielle Christiana, Layla Maw, DO 01/02/23 [phone]

## 2023-01-02 NOTE — Progress Notes (Signed)
Heart Failure Stewardship Pharmacy Note  PCP: Pcp, No PCP-Cardiologist: None  HPI: Tommy Gaines is a 57 y.o. male with COPD on 3 L oxygen chronically, CHF, hypertension, hyperlipidemia, CVA, tobacco use, OSA not on CPAP who presented with shortness of breath and wheezing. Reports his girlfriend was recently sick with symptoms of a respiratory virus. BNP on admission was 434.4 and troponin was 118. No CXR thus far and echo is pending. Was previously on excellent GDMT for CHF, but Entresto and spironolactone were held after suffering an AKI during a hospital admission earlier this year, with plans per last cardiology note in clinic to resume at follow-up.   Pertinent cardiac history: Presented in 01/2020 with NSTEMI. Echo showed LVEF of 25-30% with grade II diastolic dysfunction, and mild MR. LHC showed 100% chronic occlusion of mid LAD with collaterals, 75% diag lesion, 50% prox Ramus lesion, 75% prox LAD lesion, and the culprit lesion was a 95% prox RCA lesion treated with DES. CO at the time was 11.2 with CI of 4.2. PCWP was 24-28 and RA pressure was 14. On 02/01/23, staged PCI performed to LAD. Echo in 10/2020 showed LVEF up to 35-40%.   Pertinent Lab Values: Creatinine  Date Value Ref Range Status  04/04/2011 1.13 0.60 - 1.30 mg/dL Final   Creatinine, Ser  Date Value Ref Range Status  01/02/2023 0.93 0.61 - 1.24 mg/dL Final   BUN  Date Value Ref Range Status  01/02/2023 7 6 - 20 mg/dL Final  11/91/4782 10 7 - 18 mg/dL Final   Potassium  Date Value Ref Range Status  01/02/2023 4.2 3.5 - 5.1 mmol/L Final  04/04/2011 4.1 3.5 - 5.1 mmol/L Final   Sodium  Date Value Ref Range Status  01/02/2023 136 135 - 145 mmol/L Final  04/04/2011 140 136 - 145 mmol/L Final   B Natriuretic Peptide  Date Value Ref Range Status  01/02/2023 424.4 (H) 0.0 - 100.0 pg/mL Final    Comment:    Performed at [facility], [address] [place], [place] [address]   Magnesium  Date  Value Ref Range Status  11/08/2020 2.4 1.7 - 2.4 mg/dL Final    Comment:    Performed at [facility], [address] [place], [place] [address]   Hgb A1c MFr Bld  Date Value Ref Range Status  11/07/2020 5.4 4.8 - 5.6 % Final    Comment:    (NOTE) Pre diabetes:          5.7%-6.4%  Diabetes:              >6.4%  Glycemic control for   <7.0% adults with diabetes    TSH  Date Value Ref Range Status  11/07/2020 0.598 0.350 - 4.500 uIU/mL Final    Comment:    Performed by a 3rd Generation assay with a functional sensitivity of <=0.01 uIU/mL. Performed at [facility], [address] [place], [place] [address]     Vital Signs: Admission weight: 288 lbs Temp:  [98.3 F (36.8 C)-98.4 F (36.9 C)] 98.3 F (36.8 C) (11/14 0530) Pulse Rate:  [83-103] 83 (11/14 0730) Cardiac Rhythm: Sinus tachycardia (11/14 0145) Resp:  [0-26] 26 (11/14 0730) BP: (112-142)/(74-92) 112/74 (11/14 0730) SpO2:  [94 %-100 %] 97 % (11/14 0730) FiO2 (%):  [30 %] 30 % (11/14 0723) Weight:  [130.8 kg (288 lb 5.8 oz)] 130.8 kg (288 lb 5.8 oz) (11/14 0149)  Intake/Output Summary (Last 24 hours) at 01/02/2023 0814 Last data  filed at 01/02/2023 2841 Gross per 24 hour  Intake --  Output 300 ml  Net -300 ml   Current Heart Failure Medications:  Loop diuretic: furosemide 40 gm IV once Beta-Blocker: carvedilol 3.125 mg BID ACEI/ARB/ARNI: previously taking Entresto 24-26 mg BID, recently held MRA: previously taking spironolactone 25 mg daily, recently held SGLT2i: Jardiance 10 mg daily   Prior to admission Heart Failure Medications:  Loop diuretic: bumetanide 1 mg in the AM and 0.5 mg at lunch daily Beta-Blocker: carvedilol 3.125 mg BID ACEI/ARB/ARNI: previously taking Entresto 24-26 mg BID, recently held MRA: previously taking spironolactone 25 mg daily, recently held SGLT2i: Jardiance 10 mg daily   Assessment: 1. Acute on chronic combined systolic and diastolic heart failure  (LVEF pending, previously 35-40%)  , due to ICM. NYHA class III-IV symptoms.  -Symptoms: Reports progressive shortness of breath and wheezing. Does not notice any increased LEE, however trace is present on exam. Denies orthopnea. -Volume: Appears somewhat volume overloaded. BNP elevated. Will wait for echo to assess IVC. Agree with continuing Lasix today. Can consider adjusting dose if necessary pending IVC. Suspect symptoms are due to a combination of PNA with excess volume accumulation after holding GDMT.  -Hemodynamics: BP was elvated to 140s on admission, dropped low for 1 reading, now stable in 120s/80s. HR 80-90s. -BB: Agree with holding BB until euvolemic. -ACEI/ARB/ARNI: Ok to [employer] for now given variable BP. -MRA: Can consider resuming spironolactone given improved renal function. -SGLT2i:Can consider resuming home Jardiance. Aids in acute diuresis and showed lower incidence of AKI in the EMPULSE trial.  Plan: 1) Medication changes recommended at this time: -Consider restarting home Jardiance 10 mg daily  2) Patient assistance: -Pending  3) Education: - Patient has been educated on current HF medications and potential additions to HF medication regimen - Patient verbalizes understanding that over the next few months, these medication doses may change and more medications may be added to optimize HF regimen - Patient has been educated on basic disease state pathophysiology and goals of therapy  Medication Assistance / Insurance Benefits Check: Does the patient have prescription insurance?    Type of insurance plan:  Does the patient qualify for medication assistance through manufacturers or grants? Pending   Outpatient Pharmacy: Prior to admission outpatient pharmacy: [facility]      Please do not hesitate to reach out with questions or concerns,  Enos Fling, PharmD, CPP, BCPS Heart Failure Pharmacist  Phone - [phone] 01/02/2023 11:44 AM

## 2023-01-02 NOTE — ED Notes (Signed)
Spoke with pt to inform him Dr. Elesa Massed is unable to read either EKG and we need to obtain a EKG to further assess the pt. Pt refused stating: "Nope, nobody is shaving any hair off of my chest." Advised the pt it will only be about 2-3 spots that needs to be shaved. Pt continued to shake his head no.

## 2023-01-02 NOTE — Progress Notes (Signed)
Arrived at bedside to place patient on bipap. Increased work of breathing noted with abdominal muscle usage. PRN nebulizer given, some improvement noted. Will place patient on V60 to see if some improvement.

## 2023-01-02 NOTE — Progress Notes (Signed)
*  PRELIMINARY RESULTS* Echocardiogram 2D Echocardiogram has been performed.  Cristela Blue 01/02/2023, 9:20 AM

## 2023-01-02 NOTE — ED Triage Notes (Signed)
Patient arrives from by [facility] with complaint that he is short of breath.  Pt is on 3LNC at home.  He did 3 albuterol treatments prior to EMS.  EMS reports oxygen saturation of 88% with wheezing and rhonchi.  They gave 125 solu-medrol IM on scene, and 2 duo-neb treatments.

## 2023-01-02 NOTE — Progress Notes (Signed)
Heart Failure Nurse Navigator Progress Note  PCP: Pcp, No PCP-Cardiologist: None Admission Diagnosis:  COPD exacerbation Acute on chronic systolic congestive heart failure NSTEMI Admitted from: Home via EMS  Presentation:   Tommy Gaines presented to the ED with shortness of breath and wheezing.  Hypoxic to 88% on 3L. Denied chest pain.  No lower extremity swelling or discomfort.  BNP 434.4 and Troponin was 118. History of CHF, Hypertension, Hyperlipidemia, CVA and tobacco use.  ECHO/ LVEF: Last EF 35-40%  ECHO pending for this admission  Clinical Course:  Past Medical History:  Diagnosis Date   Arthritis    COPD (chronic obstructive pulmonary disease) (HCC)    Depression    Diverticulitis    Hyperlipidemia    Hypertension    Stroke (HCC) 2016   confirmed by CT scan      Social History   Socioeconomic History   Marital status: Single    Spouse name: Not on file   Number of children: 1   Years of education: Not on file   Highest education level: 11th grade  Occupational History   Occupation: Disability  Tobacco Use   Smoking status: Every Day    Current packs/day: 0.25    Average packs/day: 0.3 packs/day for 15.0 years (3.8 ttl pk-yrs)    Types: Cigarettes   Smokeless tobacco: Former   Tobacco comments:    He smokes 2-4 Cigarettes a day  Vaping Use   Vaping status: Never Used  Substance and Sexual Activity   Alcohol use: Yes    Alcohol/week: 2.0 standard drinks of alcohol    Types: 1 Glasses of wine, 1 Shots of liquor per week    Comment: 2 -3 times a week   Drug use: Yes    Types: Marijuana   Sexual activity: Not on file  Other Topics Concern   Not on file  Social History Narrative   ** Merged History Encounter **       Social Determinants of Health   Financial Resource Strain: Low Risk  (01/02/2023)   Overall Financial Resource Strain (CARDIA)    Difficulty of Paying Living Expenses: Not hard at all  Food Insecurity: No Food Insecurity  (01/02/2023)   Hunger Vital Sign    Worried About Running Out of Food in the Last Year: Never true    Ran Out of Food in the Last Year: Never true  Transportation Needs: No Transportation Needs (01/02/2023)   PRAPARE - Administrator, Civil Service (Medical): No    Lack of Transportation (Non-Medical): No  Physical Activity: Not on file  Stress: Not on file  Social Connections: Not on file   Education Assessment and Provision:  Detailed education and instructions provided on heart failure disease management including the following:  Signs and symptoms of Heart Failure When to call the physician Importance of daily weights Low sodium diet Fluid restriction Medication management Anticipated future follow-up appointments-Appointment scheduled for 01/10/23 @ 1:00 pm.  Pt and significant other aware of scheduled appointment.    Patient education given on each of the above topics.  Patient acknowledges understanding via teach back method and acceptance of all instructions.  Education Materials:  "Living Better With Heart Failure" Booklet, HF zone tool, & Daily Weight Tracker Tool.  Patient has scale at home: Yes Patient has pill box at home: Yes    High Risk Criteria for Readmission and/or Poor Patient Outcomes: Heart failure hospital admissions (last 6 months): 1  No Show rate: 39%  Difficult social situation: None Demonstrates medication adherence: Yes Primary Language: English Literacy level: Reading, Writing & Comprehension  Barriers of Care:   Diet & Fluid Restrictions Daily Weights Medication Compliance  Considerations/Referrals:   Referral made to Heart Failure Pharmacist Stewardship: Yes Referral made to Heart Failure CSW/NCM TOC: No Referral made to Heart & Vascular TOC clinic: Yes-Items for Follow-up on DC/TOC: Diet & Fluid Restrictions Daily Weights Medication Compliance Smoking Cessation  Roxy Horseman, RN, BSN [facility] Heart Failure  Navigator Secure Chat Only

## 2023-01-02 NOTE — Progress Notes (Signed)
ANTICOAGULATION CONSULT NOTE  Pharmacy Consult for heparin infusion Indication: ACS/STEMI  Allergies  Allergen Reactions   Acetaminophen Nausea And Vomiting   Lidocaine Rash   Tylenol [Acetaminophen] Hives    Patient Measurements: Height: 6\' 1"  (185.4 cm) Weight: 130.8 kg (288 lb 5.8 oz) IBW/kg (Calculated) : 79.9 Heparin Dosing Weight: 109.2 kg  Vital Signs: Temp: 98.2 F (36.8 C) (11/14 0937) Temp Source: Oral (11/14 0937) BP: 111/85 (11/14 1030) Pulse Rate: 84 (11/14 1030)  Labs: Recent Labs    01/02/23 0153 01/02/23 0322 01/02/23 0347 01/02/23 0935  HGB 14.7  --   --   --   HCT 46.4  --   --   --   PLT 247  --   --   --   APTT  --   --  159*  --   LABPROT  --   --  15.1  --   INR  --   --  1.2  --   HEPARINUNFRC  --   --   --  0.15*  CREATININE 0.93  --   --   --   TROPONINIHS 118* 104*  --   --     Estimated Creatinine Clearance: 121.3 mL/min (by C-G formula based on SCr of 0.93 mg/dL).   Medical History: Past Medical History:  Diagnosis Date   Arthritis    COPD (chronic obstructive pulmonary disease) (HCC)    Depression    Diverticulitis    Hyperlipidemia    Hypertension    Stroke (HCC) 2016   confirmed by CT scan     Assessment: Pt is a 57 yo male with PMH including COPD (on 3L chronically), CHF, HTN, HLD, CVA, obesity, nicotine dependence presenting to ED c/o SOB, found with elevated troponin I and BNP levels. Due to concerns for ACS, pharmacy has been consulted to initiate and manage heparin infusion. Patient not on chronic anticoagulation prior to admission, per chart review.  Goal of Therapy:  Heparin level 0.3-0.7 units/ml Monitor platelets by anticoagulation protocol: Yes  Date Time aPTT/HL Rate/Comment 1114 0935 0.15  Subtherapeutic; 1450 >> 1850 units/hr         Plan:  Give 3200 units bolus x1; then increase rate of heparin infusion to 1850 units/hour. Check heparin level and/or aPTT in 6 hours, then daily once at least two  levels are consecutively therapeutic. Continue to monitor CBC daily while on heparin infusion.   Will M. Dareen Piano, PharmD Clinical Pharmacist 01/02/2023 11:01 AM

## 2023-01-02 NOTE — ED Notes (Signed)
Was unable to obtain a clear EKG, advised pt will need to shave some hair off the area to put the leads pt refused and advised "better figure something else out." Advised pt other option is to take off EMS leads and apply new ones. EKG completed seems to appear better with the change of leads

## 2023-01-02 NOTE — TOC Benefit Eligibility Note (Signed)
Patient Product/process development scientist completed.    The patient is insured through [facility] MEDICAID.     Ran test claim for Entresto 24-26 mg and the current 30 day co-pay is $4.00.  Ran test claim for Jardiance 10 mg and last filled on 11/13/2022 for a 90 day supply  Ran test claim for Farxiga 10 mg and Requires Prior Authorization  This test claim was processed through [employer]- copay amounts may vary at other pharmacies due to [employer], or as the patient moves through the different stages of their insurance plan.     Roland Earl, CPHT Pharmacy Technician III Certified Patient Advocate [facility] Pharmacy Patient Advocate Team Direct Number: [phone]  Fax: [phone]

## 2023-01-03 DIAGNOSIS — J441 Chronic obstructive pulmonary disease with (acute) exacerbation: Secondary | ICD-10-CM | POA: Diagnosis not present

## 2023-01-03 DIAGNOSIS — K219 Gastro-esophageal reflux disease without esophagitis: Secondary | ICD-10-CM

## 2023-01-03 DIAGNOSIS — I5023 Acute on chronic systolic (congestive) heart failure: Secondary | ICD-10-CM | POA: Diagnosis not present

## 2023-01-03 DIAGNOSIS — I2510 Atherosclerotic heart disease of native coronary artery without angina pectoris: Secondary | ICD-10-CM

## 2023-01-03 DIAGNOSIS — F32A Depression, unspecified: Secondary | ICD-10-CM | POA: Diagnosis not present

## 2023-01-03 DIAGNOSIS — J9621 Acute and chronic respiratory failure with hypoxia: Secondary | ICD-10-CM | POA: Diagnosis not present

## 2023-01-03 DIAGNOSIS — G894 Chronic pain syndrome: Secondary | ICD-10-CM

## 2023-01-03 DIAGNOSIS — F17210 Nicotine dependence, cigarettes, uncomplicated: Secondary | ICD-10-CM

## 2023-01-03 DIAGNOSIS — Z9861 Coronary angioplasty status: Secondary | ICD-10-CM

## 2023-01-03 LAB — RESPIRATORY PANEL BY PCR

## 2023-01-03 LAB — CBC
HCT: 43.3 % (ref 39.0–52.0)
Hemoglobin: 13.9 g/dL (ref 13.0–17.0)
MCH: 33.2 pg (ref 26.0–34.0)
MCHC: 32.1 g/dL (ref 30.0–36.0)
MCV: 103.3 fL — ABNORMAL HIGH (ref 80.0–100.0)
Platelets: 257 10*3/uL (ref 150–400)
RBC: 4.19 MIL/uL — ABNORMAL LOW (ref 4.22–5.81)
RDW: 13.6 % (ref 11.5–15.5)
WBC: 10.3 10*3/uL (ref 4.0–10.5)
nRBC: 0 % (ref 0.0–0.2)

## 2023-01-03 LAB — BASIC METABOLIC PANEL
Anion gap: 6 (ref 5–15)
BUN: 12 mg/dL (ref 6–20)
CO2: 35 mmol/L — ABNORMAL HIGH (ref 22–32)
Calcium: 8.6 mg/dL — ABNORMAL LOW (ref 8.9–10.3)
Chloride: 96 mmol/L — ABNORMAL LOW (ref 98–111)
Creatinine, Ser: 1.22 mg/dL (ref 0.61–1.24)
GFR, Estimated: >60 mL/min (ref >60)
Glucose, Bld: 107 mg/dL — ABNORMAL HIGH (ref 70–99)
Potassium: 4.7 mmol/L (ref 3.5–5.1)
Sodium: 137 mmol/L (ref 135–145)

## 2023-01-03 LAB — HIV ANTIBODY (ROUTINE TESTING W REFLEX): HIV Screen 4th Generation wRfx: NONREACTIVE

## 2023-01-03 LAB — MAGNESIUM: Magnesium: 2.3 mg/dL (ref 1.7–2.4)

## 2023-01-03 MED ORDER — FOLIC ACID 1 MG PO TABS
1.0000 mg | ORAL_TABLET | Freq: Every day | ORAL | Status: DC
  Administered 2023-01-03 – 2023-01-04 (×2): 1 mg via ORAL
  Filled 2023-01-03 (×2): qty 1

## 2023-01-03 MED ORDER — IPRATROPIUM-ALBUTEROL 0.5-2.5 (3) MG/3ML IN SOLN
3.0000 mL | RESPIRATORY_TRACT | Status: DC
  Administered 2023-01-03 – 2023-01-04 (×6): 3 mL via RESPIRATORY_TRACT
  Filled 2023-01-03 (×6): qty 3

## 2023-01-03 MED ORDER — GABAPENTIN 300 MG PO CAPS
300.0000 mg | ORAL_CAPSULE | Freq: Three times a day (TID) | ORAL | Status: DC
  Administered 2023-01-03 – 2023-01-04 (×4): 300 mg via ORAL
  Filled 2023-01-03 (×4): qty 1

## 2023-01-03 MED ORDER — CARVEDILOL 3.125 MG PO TABS
3.1250 mg | ORAL_TABLET | Freq: Two times a day (BID) | ORAL | Status: DC
  Administered 2023-01-03 – 2023-01-04 (×2): 3.125 mg via ORAL
  Filled 2023-01-03 (×2): qty 1

## 2023-01-03 MED ORDER — POTASSIUM CHLORIDE CRYS ER 10 MEQ PO TBCR
10.0000 meq | EXTENDED_RELEASE_TABLET | Freq: Every day | ORAL | Status: DC
  Administered 2023-01-03 – 2023-01-04 (×2): 10 meq via ORAL
  Filled 2023-01-03 (×2): qty 1

## 2023-01-03 MED ORDER — METHYLPREDNISOLONE SODIUM SUCC 40 MG IJ SOLR
40.0000 mg | Freq: Two times a day (BID) | INTRAMUSCULAR | Status: DC
  Administered 2023-01-03 – 2023-01-04 (×3): 40 mg via INTRAVENOUS
  Filled 2023-01-03 (×3): qty 1

## 2023-01-03 MED ORDER — FUROSEMIDE 10 MG/ML IJ SOLN
40.0000 mg | Freq: Once | INTRAMUSCULAR | Status: AC
  Administered 2023-01-03: 40 mg via INTRAVENOUS
  Filled 2023-01-03: qty 4

## 2023-01-03 MED ORDER — MAGNESIUM OXIDE 400 MG PO TABS
400.0000 mg | ORAL_TABLET | Freq: Every day | ORAL | Status: DC
  Administered 2023-01-03 – 2023-01-04 (×2): 400 mg via ORAL
  Filled 2023-01-03 (×3): qty 1

## 2023-01-03 MED ORDER — AZITHROMYCIN 250 MG PO TABS
250.0000 mg | ORAL_TABLET | Freq: Every day | ORAL | Status: DC
  Administered 2023-01-04: 250 mg via ORAL
  Filled 2023-01-03: qty 1

## 2023-01-03 MED ORDER — EMPAGLIFLOZIN 10 MG PO TABS
10.0000 mg | ORAL_TABLET | Freq: Every day | ORAL | Status: DC
  Administered 2023-01-03 – 2023-01-04 (×2): 10 mg via ORAL
  Filled 2023-01-03 (×2): qty 1

## 2023-01-03 MED ORDER — BUMETANIDE 1 MG PO TABS
2.0000 mg | ORAL_TABLET | Freq: Two times a day (BID) | ORAL | Status: DC
  Administered 2023-01-04: 2 mg via ORAL
  Filled 2023-01-03 (×2): qty 2

## 2023-01-03 MED ORDER — TAMSULOSIN HCL 0.4 MG PO CAPS
0.4000 mg | ORAL_CAPSULE | Freq: Every day | ORAL | Status: DC
  Administered 2023-01-03 – 2023-01-04 (×2): 0.4 mg via ORAL
  Filled 2023-01-03 (×2): qty 1

## 2023-01-03 MED ORDER — AZITHROMYCIN 250 MG PO TABS
500.0000 mg | ORAL_TABLET | Freq: Every day | ORAL | Status: AC
  Administered 2023-01-03: 500 mg via ORAL
  Filled 2023-01-03: qty 2

## 2023-01-03 MED ORDER — THIAMINE HCL 100 MG PO TABS
100.0000 mg | ORAL_TABLET | Freq: Every day | ORAL | Status: DC
  Administered 2023-01-04: 100 mg via ORAL
  Filled 2023-01-03: qty 1

## 2023-01-03 NOTE — Progress Notes (Signed)
[facility] Cardiology  CARDIOLOGY PROGRESS NOTE  Patient ID: Tommy Gaines MRN: [id number] DOB/AGE: Nov 10, 1965 57 y.o.  Admit date: 01/02/2023 Referring Physician: Dr. Nelson Chimes Primary Cardiologist Dr. Gwynne Edinger Reason for Consultation heart failure  HPI: 57 year old male with past medical history significant for heart failure with reduced EF, CAD with prior PCI 2021, COPD on home oxygen who presented to hospital with worsening shortness of breath.  Today patient continues to complain of dyspnea, currently wearing BiPAP.  Bilateral wheeze on exam  Review of systems complete and found to be negative unless listed above     Past Medical History:  Diagnosis Date   Arthritis    COPD (chronic obstructive pulmonary disease) (HCC)    Depression    Diverticulitis    Hyperlipidemia    Hypertension    Stroke (HCC) 2016   confirmed by CT scan     Past Surgical History:  Procedure Laterality Date   CORONARY STENT INTERVENTION N/A 01/30/2020   Procedure: CORONARY STENT INTERVENTION;  Surgeon: Elder Negus, MD;  Location: [facility] INVASIVE CV LAB;  Service: Cardiovascular;  Laterality: N/A;   CORONARY STENT INTERVENTION N/A 02/01/2020   Procedure: CORONARY STENT INTERVENTION;  Surgeon: Yates Decamp, MD;  Location: [facility] INVASIVE CV LAB;  Service: Cardiovascular;  Laterality: N/A;   intestinal rupture  2012   colostomy with take down as a result of this surgery.  colostomy reversed 6 months post surgery.   JOINT REPLACEMENT Bilateral    shoulders   RIGHT/LEFT HEART CATH AND CORONARY ANGIOGRAPHY N/A 01/30/2020   Procedure: RIGHT/LEFT HEART CATH AND CORONARY ANGIOGRAPHY;  Surgeon: Elder Negus, MD;  Location: [facility] INVASIVE CV LAB;  Service: Cardiovascular;  Laterality: N/A;    Medications Prior to Admission  Medication Sig Dispense Refill Last Dose   aspirin EC 81 MG tablet Take 81 mg by mouth.   01/01/2023   atorvastatin (LIPITOR) 80 MG tablet Take 1 tablet (80 mg total) by mouth  daily. 30 tablet 3 01/01/2023 at LUNCH   bumetanide (BUMEX) 2 MG tablet Take 2 mg by mouth 2 (two) times daily. 1 TABLET (2 MG TOTAL) BY MOUTH DAILY AND 0.5 TABLETS (1 MG TOTAL) DAILY WITH LUNCH   01/01/2023 at LUNCH   buPROPion (WELLBUTRIN XL) 300 MG 24 hr tablet Take 300 mg by mouth daily.   01/01/2023   carvedilol (COREG) 3.125 MG tablet Take 3.125 mg by mouth 2 (two) times daily.   01/01/2023   clopidogrel (PLAVIX) 75 MG tablet Take 75 mg by mouth every morning.   12/30/2022   empagliflozin (JARDIANCE) 10 MG TABS tablet Take 1 tablet by mouth daily.   01/01/2023   folic acid (FOLVITE) 1 MG tablet Take 1 tablet (1 mg total) by mouth daily. 30 tablet 3 01/01/2023   gabapentin (NEURONTIN) 300 MG capsule Take 300 mg by mouth 3 (three) times daily.   Past Month   magnesium oxide (MAG-OX) 400 MG tablet Take 1 tablet by mouth daily.   01/01/2023   metoprolol succinate (TOPROL-XL) 25 MG 24 hr tablet Take 1 tablet by mouth daily.   01/01/2023   omeprazole (PRILOSEC) 20 MG capsule Take 20 mg by mouth daily as needed (Acid reflux).   PRN at PRN   potassium chloride (KLOR-CON) 10 MEQ tablet Take 1 tablet (10 mEq total) by mouth daily. 30 tablet 3 01/01/2023   SPIRIVA RESPIMAT 2.5 MCG/ACT AERS Inhale 2 puffs into the lungs daily.   01/01/2023   SYMBICORT 160-4.5 MCG/ACT inhaler Inhale into the lungs.  01/01/2023   tamsulosin (FLOMAX) 0.4 MG CAPS capsule Take 0.4 mg by mouth daily.   01/01/2023 at NIGHT   thiamine 100 MG tablet Take 100 mg by mouth daily.   01/01/2023   albuterol (PROVENTIL HFA;VENTOLIN HFA) 108 (90 Base) MCG/ACT inhaler Inhale 2 puffs into the lungs 3 (three) times daily.      atorvastatin (LIPITOR) 80 MG tablet Take 80 mg by mouth every morning.      budesonide-formoterol (SYMBICORT) 160-4.5 MCG/ACT inhaler Inhale 1 puff into the lungs 2 (two) times daily.      carvedilol (COREG) 3.125 MG tablet Take 1 tablet (3.125 mg total) by mouth 2 (two) times daily with a meal. 60 tablet 3     docusate sodium (COLACE) 100 MG capsule Take 1 capsule (100 mg total) by mouth 2 (two) times daily. 10 capsule 0 PRN at PRN   folic acid (FOLVITE) 1 MG tablet Take 1 mg by mouth daily.      furosemide (LASIX) 40 MG tablet Take 80 mg by mouth daily. (Patient not taking: Reported on 01/02/2023)   Not Taking   furosemide (LASIX) 40 MG tablet Take 80 mg by mouth daily. (Patient not taking: Reported on 01/02/2023)   Not Taking   gabapentin (NEURONTIN) 300 MG capsule Take 300 mg by mouth 3 (three) times daily.      nicotine (NICODERM CQ - DOSED IN MG/24 HOURS) 21 mg/24hr patch 21 mg daily.      oxyCODONE (OXY IR/ROXICODONE) 5 MG immediate release tablet Take 10 mg by mouth every 8 (eight) hours as needed.      polyethylene glycol (MIRALAX / GLYCOLAX) 17 g packet Take 17 g by mouth daily. 14 each 0 RN at PRN   sacubitril-valsartan (ENTRESTO) 24-26 MG Take 1 tablet by mouth 2 (two) times daily. (Patient not taking: Reported on 01/02/2023) 60 tablet 3 Not Taking   SPIRIVA RESPIMAT 2.5 MCG/ACT AERS Inhale 2 puffs into the lungs daily.      spironolactone (ALDACTONE) 25 MG tablet Take 1 tablet (25 mg total) by mouth daily. (Patient not taking: Reported on 01/02/2023) 30 tablet 3 Not Taking   thiamine 100 MG tablet Take 1 tablet (100 mg total) by mouth daily. (Patient not taking: Reported on 11/06/2020) 30 tablet 3    VENTOLIN HFA 108 (90 Base) MCG/ACT inhaler Inhale 2 puffs into the lungs every 6 (six) hours as needed.      Social History   Socioeconomic History   Marital status: Single    Spouse name: Not on file   Number of children: 1   Years of education: Not on file   Highest education level: 11th grade  Occupational History   Occupation: Disability  Tobacco Use   Smoking status: Every Day    Current packs/day: 0.25    Average packs/day: 0.3 packs/day for 15.0 years (3.8 ttl pk-yrs)    Types: Cigarettes   Smokeless tobacco: Former   Tobacco comments:    He smokes 2-4 cigarettes a day  Vaping  Use   Vaping status: Never Used  Substance and Sexual Activity   Alcohol use: Yes    Alcohol/week: 2.0 standard drinks of alcohol    Types: 1 Glasses of wine, 1 Shots of liquor per week    Comment: 2 -3 times a week   Drug use: Yes    Types: Marijuana   Sexual activity: Not on file  Other Topics Concern   Not on file  Social History Narrative   **  Merged History Encounter **       Social Determinants of Health   Financial Resource Strain: Low Risk  (01/02/2023)   Overall Financial Resource Strain (CARDIA)    Difficulty of Paying Living Expenses: Not hard at all  Food Insecurity: No Food Insecurity (01/02/2023)   Hunger Vital Sign    Worried About Running Out of Food in the Last Year: Never true    Ran Out of Food in the Last Year: Never true  Transportation Needs: No Transportation Needs (01/02/2023)   PRAPARE - Administrator, Civil Service (Medical): No    Lack of Transportation (Non-Medical): No  Physical Activity: Not on file  Stress: Not on file  Social Connections: Not on file  Intimate Partner Violence: Not At Risk (01/02/2023)   Humiliation, Afraid, Rape, and Kick questionnaire    Fear of Current or Ex-Partner: No    Emotionally Abused: No    Physically Abused: No    Sexually Abused: No    Family History  Problem Relation Age of Onset   Cancer Mother    Diabetes Mother    Heart disease Father       Review of systems complete and found to be negative unless listed above      PHYSICAL EXAM  General: Well developed, well nourished, in no acute distress HEENT:  Normocephalic and atramatic Neck:  No JVD.  Lungs: Bilateral diffuse wheeze on exam Heart: No significant wheeze.  No pedal edema  Labs:   Lab Results  Component Value Date   WBC 10.3 01/03/2023   HGB 13.9 01/03/2023   HCT 43.3 01/03/2023   MCV 103.3 (H) 01/03/2023   PLT 257 01/03/2023    Recent Labs  Lab 01/03/23 0418  NA 137  K 4.7  CL 96*  CO2 35*  BUN 12   CREATININE 1.22  CALCIUM 8.6*  GLUCOSE 107*   Lab Results  Component Value Date   TROPONINI < 0.02 04/04/2011    Lab Results  Component Value Date   CHOL 127 02/01/2020   Lab Results  Component Value Date   HDL 28 (L) 02/01/2020   Lab Results  Component Value Date   LDLCALC 71 02/01/2020   Lab Results  Component Value Date   TRIG 129 02/01/2020   TRIG 140 02/01/2020   TRIG 149 01/31/2020   Lab Results  Component Value Date   CHOLHDL 4.5 02/01/2020   No results found for: "LDLDIRECT"    Radiology: ECHOCARDIOGRAM COMPLETE  Result Date: 01/02/2023    ECHOCARDIOGRAM REPORT   Patient Name:   Tommy Gaines Date of Exam: 01/02/2023 Medical Rec #:  [id number]             Height:       73.0 in Accession #:    [id number]            Weight:       288.4 lb Date of Birth:  10/07/1965             BSA:          2.514 m Patient Age:    57 years              BP:           112/74 mmHg Patient Gender: M                     HR:           83  bpm. Exam Location:  [facility] Procedure: 2D Echo, Cardiac Doppler and Color Doppler Indications:     CHF I50.9  History:         Patient has prior history of Echocardiogram examinations, most                  recent 11/07/2020. COPD and Stroke; Risk Factors:Hypertension.  Sonographer:     Cristela Blue Referring Phys:  [id number] KATY L FOUST Diagnosing Phys: Julien Nordmann MD  Sonographer Comments: Technically challenging study due to limited acoustic windows and no apical window. Image acquisition challenging due to COPD. IMPRESSIONS  1. Left ventricular ejection fraction, by estimation, is 25 to 30%. The left ventricle has severely decreased function. The left ventricle demonstrates global hypokinesis. The left ventricular internal cavity size was moderately dilated. Left ventricular diastolic parameters are indeterminate.  2. Right ventricular systolic function is normal. The right ventricular size is normal. Tricuspid regurgitation signal is inadequate for  assessing PA pressure.  3. The mitral valve is normal in structure. Mild mitral valve regurgitation. No evidence of mitral stenosis.  4. The aortic valve is normal in structure. Aortic valve regurgitation is not visualized. Aortic valve sclerosis is present, with no evidence of aortic valve stenosis.  5. The inferior vena cava is normal in size with greater than 50% respiratory variability, suggesting right atrial pressure of 3 mmHg. FINDINGS  Left Ventricle: Left ventricular ejection fraction, by estimation, is 25 to 30%. The left ventricle has severely decreased function. The left ventricle demonstrates global hypokinesis. The left ventricular internal cavity size was moderately dilated. There is no left ventricular hypertrophy. Left ventricular diastolic parameters are indeterminate. Right Ventricle: The right ventricular size is normal. No increase in right ventricular wall thickness. Right ventricular systolic function is normal. Tricuspid regurgitation signal is inadequate for assessing PA pressure. Left Atrium: Left atrial size was normal in size. Right Atrium: Right atrial size was normal in size. Pericardium: There is no evidence of pericardial effusion. Mitral Valve: The mitral valve is normal in structure. Mild mitral valve regurgitation. No evidence of mitral valve stenosis. Tricuspid Valve: The tricuspid valve is normal in structure. Tricuspid valve regurgitation is not demonstrated. No evidence of tricuspid stenosis. Aortic Valve: The aortic valve is normal in structure. Aortic valve regurgitation is not visualized. Aortic valve sclerosis is present, with no evidence of aortic valve stenosis. Pulmonic Valve: The pulmonic valve was normal in structure. Pulmonic valve regurgitation is not visualized. No evidence of pulmonic stenosis. Aorta: The aortic root is normal in size and structure. Venous: The inferior vena cava is normal in size with greater than 50% respiratory variability, suggesting right  atrial pressure of 3 mmHg. IAS/Shunts: No atrial level shunt detected by color flow Doppler.  LEFT VENTRICLE PLAX 2D LVIDd:         6.10 cm LVIDs:         5.60 cm LV PW:         1.10 cm LV IVS:        1.30 cm LVOT diam:     2.20 cm LVOT Area:     3.80 cm  LEFT ATRIUM         Index LA diam:    3.00 cm 1.19 cm/m   AORTA Ao Root diam: 3.60 cm  SHUNTS Systemic Diam: 2.20 cm Julien Nordmann MD Electronically signed by Julien Nordmann MD Signature Date/Time: 01/02/2023/5:16:42 PM    Final    DG Chest Portable 1 View  Result Date: 01/02/2023 CLINICAL  DATA:  Shortness of breath. EXAM: PORTABLE CHEST 1 VIEW COMPARISON:  November 05, 2020 FINDINGS: The cardiac silhouette is mildly enlarged and unchanged in size. Mild prominence of the central pulmonary vasculature is seen without evidence of overt pulmonary edema. Mild stable atelectatic changes are seen within the mid left lung and bilateral lung bases. No pleural effusion or pneumothorax is identified. Bilateral shoulder replacements are seen. Multilevel degenerative changes are noted throughout the thoracic spine. IMPRESSION: 1. Mild cardiomegaly and central pulmonary vascular congestion without overt pulmonary edema. 2. Mild stable bilateral atelectasis. Electronically Signed   By: Aram Candela M.D.   On: 01/02/2023 02:16    ASSESSMENT AND PLAN:  Acute on chronic hypoxic respiratory failure Heart failure with reduced EF, LVEF 25 to 30% CAD with prior PCI 2021 COPD exacerbation Type II troponin elevation  Patient presentation of worsening shortness of breath primarily secondary to COPD exacerbation.  Continues to have bilateral wheeze on exam, aggressive COPD management per primary team. Overall appears euvolemic on exam today, restart home Bumex. Restart low-dose Coreg and jardiance.  If blood pressure and renal function allows, consider low-dose losartan from tomorrow.  Signed: Kathryne Gin MD,PhD, [facility] 01/03/2023, 12:36 PM

## 2023-01-03 NOTE — Assessment & Plan Note (Signed)
Echocardiogram with EF of 25 to 30% and moderately dilated LV. BNP elevated at 424, clinically mild lower extremity edema. -Giving 1 more dose of IV Lasix -Will monitor volume status closely

## 2023-01-03 NOTE — Progress Notes (Signed)
Progress Note   Patient: Tommy Gaines ION:[id number] DOB: 02/27/1965 DOA: 01/02/2023     1 DOS: the patient was seen and examined on 01/03/2023   Brief hospital course: Taken from H&P.  Arion Bufkin is a 57 y.o. male with medical history significant of  CAD, hypertension, dyslipidemia, CVA, chronic HFrEF LVEF 35-40% in Jan 2024 at [facility], tobacco use disorder, chronic pain, depression, GERD, CKD, COPD.  He presents to [facility] regional ED with shortness of breath that started yesterday evening with associated wheezing. With EMS he was hypoxic 88% on 3 L and received 2 DuoNebs and 125 mg IV Solu-Medrol and arrival.  He denies chest pain, palpitations, lower extremity edema.  Reports that girlfriend and her nephew that live with them have been sick with respiratory illness recently.    On presentation patient was afebrile, mildly tachycardic and tachypneic and saturating well on 6 L of oxygen via mask.VBG pH 7.22, pCO2 85, and bicarb 34.8.  Troponin 118--> 104, BNP 424.4.  CXR obtained and shows mild cardiomegaly, pulmonary vascular congestion, and stable bilateral atelectasis.   Patient was initially started on heparin infusion in ED which was later discontinued, low risk for ACS likely demand ischemia with respiratory distress. Patient was later placed on BiPAP.  Also given IV Lasix  11/15: Vitals with mild tachycardia, able to wean back to 3 L, continuing BiPAP at night, apparently patient was not using regularly at home due to some machine issue.  COVID, RSV and influenza negative, RVP with parainfluenza virus 4. Significant wheezing, ordered some steroid and 1 more dose of IV Lasix.   Assessment and Plan: * Acute on chronic respiratory failure with hypoxia and hypercapnia (HCC) Initially requiring BiPAP, now transitioned to 4 L of oxygen, 3 L of baseline use. Noncompliant with nightly BiPAP resulted in hypercapnia. Likely multifactorial with recent COPD exacerbation  secondary to parainfluenza virus and fluid overload. -Continue supplemental oxygen-try weaning back to baseline -Continue with BiPAP at night  Acute on chronic systolic congestive heart failure (HCC) Echocardiogram with EF of 25 to 30% and moderately dilated LV. BNP elevated at 424, clinically mild lower extremity edema. -Giving 1 more dose of IV Lasix -Will monitor volume status closely  COPD exacerbation (HCC) RVP positive for parainfluenza virus.  Similar symptoms in girlfriend and nephew. Still wheezing. -Added Solu-Medrol -DuoNeb every 4 hourly -Continue supportive care  Depression -Continue home bupropion  CAD S/P percutaneous coronary angioplasty Mildly elevated troponin with a flat curve and downward trend.  No chest pain.  Initially started on heparin infusion in ED which was not continued.  Likely secondary to demand ischemia with current respiratory status. -Continue home aspirin, Plavix and atorvastatin. -Cardiology was consulted and they are recommending to restart home Bumex, Coreg and Jardiance.  GERD (gastroesophageal reflux disease) -Continue Protonix  Chronic pain syndrome -Continue home oxycodone as needed  Nicotine dependence Counseling was provided. -Nicotine patch   Subjective: Patient continued to have wheezing and shortness of breath.  Physical Exam: Vitals:   01/03/23 0821 01/03/23 1101 01/03/23 1143 01/03/23 1501  BP: 133/86  131/78   Pulse: (!) 112  (!) 108   Resp: 12  20   Temp: 98.9 F (37.2 C)  98.3 F (36.8 C)   TempSrc: Oral     SpO2: 99% 96% 98% 98%  Weight:      Height:       General.  Obese gentleman, in no acute distress. Pulmonary.  Bilateral wheeze with some scattered crackles, mildly increased  work of breathing. CV.  Regular rate and rhythm, no JVD, rub or murmur. Abdomen.  Soft, nontender, nondistended, BS positive. CNS.  Alert and oriented .  No focal neurologic deficit. Extremities.  Trace LE edema, no cyanosis, pulses  intact and symmetrical. Psychiatry.  Judgment and insight appears normal.   Data Reviewed: Prior data reviewed  Family Communication: Discussed with patient  Disposition: Status is: Inpatient Remains inpatient appropriate because: Severity of illness  Planned Discharge Destination: Home  DVT prophylaxis.  Lovenox Time spent: 50 minutes  This record has been created using Conservation officer, historic buildings. Errors have been sought and corrected,but may not always be located. Such creation errors do not reflect on the standard of care.   Author: Arnetha Courser, MD 01/03/2023 3:07 PM  For on call review [email or web address].

## 2023-01-03 NOTE — Assessment & Plan Note (Signed)
RVP positive for parainfluenza virus.  Similar symptoms in girlfriend and nephew. Still wheezing. -Added Solu-Medrol -DuoNeb every 4 hourly -Continue supportive care

## 2023-01-03 NOTE — Assessment & Plan Note (Signed)
-  Continue home oxycodone as needed

## 2023-01-03 NOTE — Progress Notes (Signed)
Heart Failure Stewardship Pharmacy Note  PCP: Pcp, No PCP-Cardiologist: None  HPI: Tommy Gaines is a 57 y.o. male with COPD on 3 L oxygen chronically, CHF, hypertension, hyperlipidemia, CVA, tobacco use, OSA not on CPAP who presented with shortness of breath and wheezing. Reports his girlfriend was recently sick with symptoms of a respiratory virus. BNP on admission was 434.4 and troponin was 118. No CXR thus far and echo is pending. Was previously on excellent GDMT for CHF, but Entresto and spironolactone were held after suffering an AKI during a hospital admission earlier this year, with plans per last cardiology note in clinic to resume at follow-up.   Pertinent cardiac history: Presented in 01/2020 with NSTEMI. Echo showed LVEF of 25-30% with grade II diastolic dysfunction, and mild MR. LHC showed 100% chronic occlusion of mid LAD with collaterals, 75% diag lesion, 50% prox Ramus lesion, 75% prox LAD lesion, and the culprit lesion was a 95% prox RCA lesion treated with DES. CO at the time was 11.2 with CI of 4.2. PCWP was 24-28 and RA pressure was 14. On 02/01/23, staged PCI performed to LAD. Echo in 10/2020 showed LVEF up to 35-40%, however after holding GDMT, echo this admission showed LVEF back down to 25-30%.  Pertinent Lab Values: Creatinine  Date Value Ref Range Status  04/04/2011 1.13 0.60 - 1.30 mg/dL Final   Creatinine, Ser  Date Value Ref Range Status  01/03/2023 1.22 0.61 - 1.24 mg/dL Final   BUN  Date Value Ref Range Status  01/03/2023 12 6 - 20 mg/dL Final  16/11/9602 10 7 - 18 mg/dL Final   Potassium  Date Value Ref Range Status  01/03/2023 4.7 3.5 - 5.1 mmol/L Final  04/04/2011 4.1 3.5 - 5.1 mmol/L Final   Sodium  Date Value Ref Range Status  01/03/2023 137 135 - 145 mmol/L Final  04/04/2011 140 136 - 145 mmol/L Final   B Natriuretic Peptide  Date Value Ref Range Status  01/02/2023 424.4 (H) 0.0 - 100.0 pg/mL Final    Comment:    Performed at  [facility], [address] [place], [place] [address]   Magnesium  Date Value Ref Range Status  01/03/2023 2.3 1.7 - 2.4 mg/dL Final    Comment:    Performed at [facility], [address] [place], [place] [address]   Hgb A1c MFr Bld  Date Value Ref Range Status  11/07/2020 5.4 4.8 - 5.6 % Final    Comment:    (NOTE) Pre diabetes:          5.7%-6.4%  Diabetes:              >6.4%  Glycemic control for   <7.0% adults with diabetes    TSH  Date Value Ref Range Status  11/07/2020 0.598 0.350 - 4.500 uIU/mL Final    Comment:    Performed by a 3rd Generation assay with a functional sensitivity of <=0.01 uIU/mL. Performed at [facility], [address] [place], [place] [address]     Vital Signs: Admission weight: 288 lbs Temp:  [97.8 F (36.6 C)-98.9 F (37.2 C)] 98.9 F (37.2 C) (11/15 0821) Pulse Rate:  [67-125] 112 (11/15 0821) Cardiac Rhythm: Atrial fibrillation (11/14 1900) Resp:  [12-35] 12 (11/15 0821) BP: (94-153)/(64-121) 133/86 (11/15 0821) SpO2:  [90 %-100 %] 99 % (11/15 0821) FiO2 (%):  [40 %] 40 % (11/15 0758) Weight:  [124.2 kg (273 lb 12.8 oz)] 124.2 kg (273 lb 12.8 oz) (11/14  1617)  Intake/Output Summary (Last 24 hours) at 01/03/2023 0825 Last data filed at 01/03/2023 0500 Gross per 24 hour  Intake --  Output 1200 ml  Net -1200 ml   Current Heart Failure Medications:  Loop diuretic: none Beta-Blocker: none ACEI/ARB/ARNI: none MRA: none SGLT2i: none  Prior to admission Heart Failure Medications:  Loop diuretic: bumetanide 1 mg in the AM and 0.5 mg at lunch daily Beta-Blocker: carvedilol 3.125 mg BID ACEI/ARB/ARNI: previously taking Entresto 24-26 mg BID, recently held MRA: previously taking spironolactone 25 mg daily, recently held SGLT2i: Jardiance 10 mg daily  Assessment: 1. Acute on chronic combined systolic and diastolic heart failure (LVEF pending, previously 35-40%)  , due to ICM. NYHA class III-IV  symptoms.  -Symptoms: Reports progressive shortness of breath and wheezing. NO notable LEE. Denies orthopnea. -Volume: Appears relatively euvolemic. BNP elevated on admission and IVC was not significantly dilated per report. Agree with holding diuresis. Can consider resuming home Bumex tomorrow. Suspect symptoms are due to primarily to respiratory virus + COPD. -Hemodynamics: BP was elvated to 140s on admission, dropped low for 1 reading, now stable in 120-150s/80s. HR 80-90s. -BB: Agree with holding BB until euvolemic. Given COPD exacerbation, bisoprolol would be preferred over carvedilol on discharge. -ACEI/ARB/ARNI: Consider resuming Entresto with BP more stable. -MRA: Can consider resuming spironolactone prior to discharge given improved renal function. -SGLT2i:Can consider resuming home Jardiance when more mobile. -Given LVEF is decreased off GDMT, would benefit from reinitiation prior to discharge.   Plan: 1) Medication changes recommended at this time: -Consider restarting Entresto 24-26 mg BID -Agree with holding diuretics at this time. Can consider resuming oral bumetanide tomorrow.  2) Patient assistance: -Sherryll Burger and Jardiance are $4  3) Education: - Patient has been educated on current HF medications and potential additions to HF medication regimen - Patient verbalizes understanding that over the next few months, these medication doses may change and more medications may be added to optimize HF regimen - Patient has been educated on basic disease state pathophysiology and goals of therapy  Medication Assistance / Insurance Benefits Check: Does the patient have prescription insurance?    Type of insurance plan:  Does the patient qualify for medication assistance through manufacturers or grants? Pending   Outpatient Pharmacy: Prior to admission outpatient pharmacy: [facility]      Please do not hesitate to reach out with questions or concerns,  Enos Fling, PharmD,  CPP, BCPS Heart Failure Pharmacist  Phone - [phone] 01/03/2023 8:25 AM

## 2023-01-03 NOTE — Assessment & Plan Note (Signed)
Counseling was provided. - Nicotine patch 

## 2023-01-03 NOTE — Plan of Care (Signed)

## 2023-01-03 NOTE — Plan of Care (Signed)
  Problem: Education: Goal: Knowledge of General Education information will improve Description: Including pain rating scale, medication(s)/side effects and non-pharmacologic comfort measures Outcome: Progressing   Problem: Health Behavior/Discharge Planning: Goal: Ability to manage health-related needs will improve Outcome: Progressing   Problem: Clinical Measurements: Goal: Ability to maintain clinical measurements within normal limits will improve Outcome: Progressing Goal: Will remain free from infection Outcome: Progressing Goal: Diagnostic test results will improve Outcome: Progressing Goal: Cardiovascular complication will be avoided Outcome: Progressing   Problem: Nutrition: Goal: Adequate nutrition will be maintained Outcome: Progressing   Problem: Coping: Goal: Level of anxiety will decrease Outcome: Progressing   Problem: Elimination: Goal: Will not experience complications related to bowel motility Outcome: Progressing   Problem: Pain Management: Goal: General experience of comfort will improve Outcome: Progressing   Problem: Safety: Goal: Ability to remain free from injury will improve Outcome: Progressing

## 2023-01-03 NOTE — Discharge Instructions (Signed)
Some PCP options in [place] area- not a comprehensive list  [facility]- [phone] [place]- [phone] [facility]- [phone] [facility]- [phone] [facility]- [phone] [place]- [phone]  or [facility] [phone]  

## 2023-01-03 NOTE — Hospital Course (Addendum)
Taken from H&P.  Tommy Gaines is a 57 y.o. male with medical history significant of  CAD, hypertension, dyslipidemia, CVA, chronic HFrEF LVEF 35-40% in Jan 2024 at [facility], tobacco use disorder, chronic pain, depression, GERD, CKD, COPD.  He presents to [facility] ED with shortness of breath that started yesterday evening with associated wheezing. With EMS he was hypoxic 88% on 3 L and received 2 DuoNebs and 125 mg IV Solu-Medrol and arrival.  He denies chest pain, palpitations, lower extremity edema.  Reports that girlfriend and her nephew that live with them have been sick with respiratory illness recently.    On presentation patient was afebrile, mildly tachycardic and tachypneic and saturating well on 6 L of oxygen via mask.VBG pH 7.22, pCO2 85, and bicarb 34.8.  Troponin 118--> 104, BNP 424.4.  CXR obtained and shows mild cardiomegaly, pulmonary vascular congestion, and stable bilateral atelectasis.   Patient was initially started on heparin infusion in ED which was later discontinued, low risk for ACS likely demand ischemia with respiratory distress. Patient was later placed on BiPAP.  Also given IV Lasix  11/15: Vitals with mild tachycardia, able to wean back to 3 L, continuing BiPAP at night, apparently patient was not using regularly at home due to some machine issue.  COVID, RSV and influenza negative, RVP with parainfluenza virus 4. Significant wheezing, ordered some steroid and 1 more dose of IV Lasix.

## 2023-01-03 NOTE — Assessment & Plan Note (Signed)
-   Continue home bupropion

## 2023-01-03 NOTE — Assessment & Plan Note (Signed)
Mildly elevated troponin with a flat curve and downward trend.  No chest pain.  Initially started on heparin infusion in ED which was not continued.  Likely secondary to demand ischemia with current respiratory status. -Continue home aspirin, Plavix and atorvastatin. -Cardiology was consulted and they are recommending to restart home Bumex, Coreg and Jardiance.

## 2023-01-03 NOTE — TOC Initial Note (Signed)
Transition of Care [facility]) - Initial/Assessment Note    Patient Details  Name: Tommy Gaines MRN: [id number] Date of Birth: 05/15/1965  Transition [facility] Care [facility]) CM/SW Contact:    Truddie Hidden, RN Phone Number: 01/03/2023, 4:26 PM  Clinical Narrative:                 Patient does not have a PCP.  PCP list added to AVS.          Patient Goals and CMS Choice            Expected Discharge Plan and Services                                              Prior Living Arrangements/Services                       Activities of Daily Living   ADL Screening (condition at time of admission) Independently performs ADLs?: Yes (appropriate for developmental age) Is the patient deaf or have difficulty hearing?: No Does the patient have difficulty seeing, even when wearing glasses/contacts?: No Does the patient have difficulty concentrating, remembering, or making decisions?: No  Permission Sought/Granted                  Emotional Assessment              Admission diagnosis:  COPD exacerbation (HCC) [J44.1] NSTEMI (non-ST elevated myocardial infarction) (HCC) [I21.4] Acute on chronic systolic congestive heart failure (HCC) [I50.23] Acute on chronic respiratory failure with hypoxia and hypercapnia (HCC) [G40.10, J96.22] Patient Active Problem List   Diagnosis Date Noted   Acute respiratory failure with hypoxia and hypercapnia (HCC)    Acute pulmonary edema (HCC)    Respiratory distress    Acute on chronic systolic congestive heart failure (HCC) 07/25/2021   Obesity, Class III, BMI 40-49.9 (morbid obesity) (HCC) 07/25/2021   Acute on chronic respiratory failure with hypoxia and hypercapnia (HCC) 07/25/2021   Depression 07/25/2021   CAD S/P percutaneous coronary angioplasty 07/25/2021   Nicotine dependence 07/25/2021   Acute respiratory failure with hypoxia and hypercapnia (HCC) 11/06/2020   Dilated cardiomyopathy (HCC) 11/06/2020    Obesity, Class III, BMI 40-49.9 (morbid obesity) (HCC) 11/06/2020   Essential hypertension 11/06/2020   Nicotine dependence 11/06/2020   Endotracheally intubated    Alcohol use    Acute encephalopathy    Non-ST elevation (NSTEMI) myocardial infarction (HCC)    Acute on chronic systolic CHF (congestive heart failure) (HCC)    Respiratory failure (HCC) 01/29/2020   Acute respiratory failure (HCC) 01/29/2020   Chronic pain syndrome 09/10/2017   Primary osteoarthritis of both knees 09/10/2017   Primary osteoarthritis of both shoulders 09/10/2017   GERD (gastroesophageal reflux disease) 10/17/2016   Chronic pain of both knees 10/03/2015   Chronic pain of both shoulders 10/03/2015   History of replacement of both shoulder joints 10/03/2015   Tendonitis of elbow, left 08/13/2012   Obesity 08/15/2011   COPD exacerbation (HCC) 07/30/2011   PCP:  Pcp, No Pharmacy:   [id number] - Cl[facility] - [place], [place] - [address] [place] [address] Phone: [phone] Fax: [phone]     Social Determinants of Health (SDOH) Social History: SDOH Screenings   Food Insecurity: No Food Insecurity (01/02/2023)  Housing: Low Risk  (01/02/2023)  Transportation Needs: No Transportation Needs (01/02/2023)  Utilities: Not At Risk (01/02/2023)  Depression (PHQ2-9): Medium Risk (03/20/2020)  Financial Resource Strain: Low Risk  (01/02/2023)  Tobacco Use: High Risk (01/02/2023)   SDOH Interventions: Housing Interventions: Intervention Not Indicated Transportation Interventions: Intervention Not Indicated Alcohol Usage Interventions: Intervention Not Indicated (Score <7) Financial Strain Interventions: Intervention Not Indicated   Readmission Risk Interventions     No data to display

## 2023-01-03 NOTE — Assessment & Plan Note (Addendum)
Initially requiring BiPAP, now transitioned to 4 L of oxygen, 3 L of baseline use. Noncompliant with nightly BiPAP resulted in hypercapnia. Likely multifactorial with recent COPD exacerbation secondary to parainfluenza virus and fluid overload. -Continue supplemental oxygen-try weaning back to baseline -Continue with BiPAP at night

## 2023-01-03 NOTE — Assessment & Plan Note (Signed)
Continue Protonix °

## 2023-01-04 ENCOUNTER — Encounter: Payer: Self-pay | Admitting: Family Medicine

## 2023-01-04 DIAGNOSIS — F32A Depression, unspecified: Secondary | ICD-10-CM | POA: Diagnosis not present

## 2023-01-04 DIAGNOSIS — J441 Chronic obstructive pulmonary disease with (acute) exacerbation: Secondary | ICD-10-CM | POA: Diagnosis not present

## 2023-01-04 DIAGNOSIS — J9621 Acute and chronic respiratory failure with hypoxia: Secondary | ICD-10-CM | POA: Diagnosis not present

## 2023-01-04 DIAGNOSIS — I2510 Atherosclerotic heart disease of native coronary artery without angina pectoris: Secondary | ICD-10-CM | POA: Diagnosis not present

## 2023-01-04 DIAGNOSIS — I5023 Acute on chronic systolic (congestive) heart failure: Secondary | ICD-10-CM | POA: Diagnosis not present

## 2023-01-04 MED ORDER — IPRATROPIUM-ALBUTEROL 0.5-2.5 (3) MG/3ML IN SOLN
3.0000 mL | Freq: Four times a day (QID) | RESPIRATORY_TRACT | Status: DC
  Administered 2023-01-04: 3 mL via RESPIRATORY_TRACT
  Filled 2023-01-04: qty 3

## 2023-01-04 MED ORDER — OXYCODONE HCL 5 MG PO TABS: 10.0000 mg | ORAL_TABLET | Freq: Three times a day (TID) | ORAL | 0 refills | Status: AC | PRN

## 2023-01-04 MED ORDER — ENOXAPARIN SODIUM 60 MG/0.6ML IJ SOSY: 60.0000 mg | PREFILLED_SYRINGE | INTRAMUSCULAR | Status: DC

## 2023-01-04 MED ORDER — PREDNISONE 50 MG PO TABS: ORAL_TABLET | ORAL | 0 refills | Status: AC

## 2023-01-04 MED ORDER — AZITHROMYCIN 250 MG PO TABS: ORAL_TABLET | ORAL | 0 refills | Status: AC

## 2023-01-04 MED ORDER — OMEPRAZOLE 20 MG PO CPDR: 20.0000 mg | DELAYED_RELEASE_CAPSULE | Freq: Every day | ORAL | 0 refills | Status: AC | PRN

## 2023-01-04 MED ORDER — IPRATROPIUM-ALBUTEROL 0.5-2.5 (3) MG/3ML IN SOLN: 3.0000 mL | Freq: Four times a day (QID) | RESPIRATORY_TRACT | 0 refills | Status: AC | PRN

## 2023-01-04 NOTE — Assessment & Plan Note (Signed)
RVP positive for parainfluenza virus.  Similar symptoms in girlfriend and nephew. -Steroid and Z-Pak was added yesterday.  Much improved at baseline of oxygen requirement and no further significant wheeze. -Continue supportive care

## 2023-01-04 NOTE — TOC Transition Note (Signed)
Transition of Care [facility]) - CM/SW Discharge Note   Patient Details  Name: Tommy Gaines MRN: [id number] Date of Birth: 06/26/1965  [facility]) CM/SW Contact:  Bing Quarry, RN Phone Number: 01/04/2023, 2:18 PM   Clinical Narrative: 11/16: Dc today.  Follow up recommendations per provider DC Summary:  PCP: Pcp, None listed, but PCP options list are present in AVS summary.    Recommendations at discharge:  Please obtain CBC and BMP on follow-up Follow-up with primary care provider (Once obtained).  Follow-up with cardiology   Scheduled f/u appointments:  [facility] 11/22/4 at 1pm. With [facility].  Dr Gwynne Edinger, Cardiologist,  in ONE week at [facility].    Gabriel Cirri MSN RN CM  Care Management Department.  [place]  [facility] Direct Dial: [phone] Main Office Phone: [phone] Weekends Only            Patient Goals and CMS Choice      Discharge Placement                         Discharge Plan and Services Additional resources added to the After Visit Summary for                                       Social Determinants of Health (SDOH) Interventions SDOH Screenings   Food Insecurity: No Food Insecurity (01/02/2023)  Housing: Low Risk  (01/02/2023)  Transportation Needs: No Transportation Needs (01/02/2023)  Utilities: Not At Risk (01/02/2023)  Depression (PHQ2-9): Medium Risk (03/20/2020)  Financial Resource Strain: Low Risk  (01/02/2023)  Tobacco Use: High Risk (01/02/2023)     Readmission Risk Interventions     No data to display

## 2023-01-04 NOTE — Plan of Care (Signed)

## 2023-01-04 NOTE — Progress Notes (Signed)
SUBJECTIVE: Patient is significantly improved with less shortness of breath after being treated for COPD exacerbation and IV Lasix for CHF.  Vitals:   01/04/23 0314 01/04/23 0500 01/04/23 0715 01/04/23 0905  BP:  129/77  119/88  Pulse: 97 97  96  Resp:    20  Temp:  97.7 F (36.5 C)  98.1 F (36.7 C)  TempSrc:  Oral    SpO2: 97% 97% 98% 94%  Weight:      Height:        Intake/Output Summary (Last 24 hours) at 01/04/2023 1131 Last data filed at 01/04/2023 1104 Gross per 24 hour  Intake 600 ml  Output 2550 ml  Net -1950 ml    LABS: Basic Metabolic Panel: Recent Labs    01/02/23 0153 01/03/23 0418  NA 136 137  K 4.2 4.7  CL 98 96*  CO2 30 35*  GLUCOSE 118* 107*  BUN 7 12  CREATININE 0.93 1.22  CALCIUM 9.0 8.6*  MG  --  2.3   Liver Function Tests: No results for input(s): "AST", "ALT", "ALKPHOS", "BILITOT", "PROT", "ALBUMIN" in the last 72 hours. No results for input(s): "LIPASE", "AMYLASE" in the last 72 hours. CBC: Recent Labs    01/02/23 0153 01/03/23 0418  WBC 7.6 10.3  NEUTROABS 6.2  --   HGB 14.7 13.9  HCT 46.4 43.3  MCV 106.4* 103.3*  PLT 247 257   Cardiac Enzymes: No results for input(s): "CKTOTAL", "CKMB", "CKMBINDEX", "TROPONINI" in the last 72 hours. BNP: Invalid input(s): "POCBNP" D-Dimer: No results for input(s): "DDIMER" in the last 72 hours. Hemoglobin A1C: No results for input(s): "HGBA1C" in the last 72 hours. Fasting Lipid Panel: No results for input(s): "CHOL", "HDL", "LDLCALC", "TRIG", "CHOLHDL", "LDLDIRECT" in the last 72 hours. Thyroid Function Tests: No results for input(s): "TSH", "T4TOTAL", "T3FREE", "THYROIDAB" in the last 72 hours.  Invalid input(s): "FREET3" Anemia Panel: No results for input(s): "VITAMINB12", "FOLATE", "FERRITIN", "TIBC", "IRON", "RETICCTPCT" in the last 72 hours.   PHYSICAL EXAM General: Well developed, well nourished, in no acute distress HEENT:  Normocephalic and atramatic Neck:  No JVD.  Lungs:  Clear bilaterally to auscultation and percussion. Heart: HRRR . Normal S1 and S2 without gallops or murmurs.  Abdomen: Bowel sounds are positive, abdomen soft and non-tender  Msk:  Back normal, normal gait. Normal strength and tone for age. Extremities: No clubbing, cyanosis or edema.   Neuro: Alert and oriented X 3. Psych:  Good affect, responds appropriately  TELEMETRY: Sinus rhythm  ASSESSMENT AND PLAN: COPD exacerbation with also HFrEF and minimally elevated troponin.  Patient is gradually getting better with treatment of COPD exacerbation and IV Lasix.  Will closely monitor the renal function.  Will follow with you closely.   ICD-10-CM   1. COPD exacerbation (HCC)  J44.1     2. Acute on chronic systolic congestive heart failure (HCC)  I50.23     3. NSTEMI (non-ST elevated myocardial infarction) (HCC)  I21.4       Principal Problem:   Acute on chronic respiratory failure with hypoxia and hypercapnia (HCC) Active Problems:   COPD exacerbation (HCC)   GERD (gastroesophageal reflux disease)   Chronic pain syndrome   Nicotine dependence   Acute on chronic systolic congestive heart failure (HCC)   Depression   CAD S/P percutaneous coronary angioplasty    Adrian Blackwater, MD, [facility] 01/04/2023 11:31 AM

## 2023-01-04 NOTE — Discharge Summary (Signed)
Physician Discharge Summary   Patient: Tommy Gaines MRN: [id number] DOB: 12/07/1965  Admit date:     01/02/2023  Discharge date: 01/04/23  Discharge Physician: Arnetha Courser   PCP: Pcp, No   Recommendations at discharge:  Please obtain CBC and BMP on follow-up Follow-up with primary care provider Follow-up with cardiology  Discharge Diagnoses: Principal Problem:   Acute on chronic respiratory failure with hypoxia and hypercapnia (HCC) Active Problems:   Acute on chronic systolic congestive heart failure (HCC)   COPD exacerbation (HCC)   Depression   CAD S/P percutaneous coronary angioplasty   GERD (gastroesophageal reflux disease)   Chronic pain syndrome   Nicotine dependence   Hospital Course: Taken from H&P.  Tommy Gaines is a 56 y.o. male with medical history significant of  CAD, hypertension, dyslipidemia, CVA, chronic HFrEF LVEF 35-40% in Jan 2024 at [facility], tobacco use disorder, chronic pain, depression, GERD, CKD, COPD.  He presents to [facility] regional ED with shortness of breath that started yesterday evening with associated wheezing. With EMS he was hypoxic 88% on 3 L and received 2 DuoNebs and 125 mg IV Solu-Medrol and arrival.  He denies chest pain, palpitations, lower extremity edema.  Reports that girlfriend and her nephew that live with them have been sick with respiratory illness recently.    On presentation patient was afebrile, mildly tachycardic and tachypneic and saturating well on 6 L of oxygen via mask.VBG pH 7.22, pCO2 85, and bicarb 34.8.  Troponin 118--> 104, BNP 424.4.  CXR obtained and shows mild cardiomegaly, pulmonary vascular congestion, and stable bilateral atelectasis.   Patient was initially started on heparin infusion in ED which was later discontinued, low risk for ACS likely demand ischemia with respiratory distress. Patient was later placed on BiPAP.  Also given IV Lasix  11/15: Vitals with mild tachycardia, able to wean back to  3 L, continuing BiPAP at night, apparently patient was not using regularly at home due to some machine issue.  COVID, RSV and influenza negative, RVP with parainfluenza virus 4. Significant wheezing, ordered some steroid and 1 more dose of IV Lasix.  Also started on Z-Pak for COPD exacerbation secondary to parainfluenza virus infection.  11/16; hemodynamically stable.  On baseline oxygen requirement of 3 L, no more wheezing. Patient is being discharged on 4 more doses of Zithromax and 5 doses of prednisone. Patient likely had COPD exacerbation secondary to parainfluenza viral infection. Cardiology has already switched him to home Bumex.  Patient was provided with 20 pills of oxycodone at his request so he can go back to his pain clinic for further assistance.  Patient will continue with his current medications and need to have a close follow-up with his providers for further management.  Assessment and Plan: * Acute on chronic respiratory failure with hypoxia and hypercapnia (HCC) Initially requiring BiPAP, now transitioned to 4 L of oxygen, 3 L of baseline use. Noncompliant with nightly BiPAP resulted in hypercapnia. Likely multifactorial with recent COPD exacerbation secondary to parainfluenza virus and fluid overload. -Continue supplemental oxygen-try weaning back to baseline -Continue with BiPAP at night  Acute on chronic systolic congestive heart failure (HCC) Echocardiogram with EF of 25 to 30% and moderately dilated LV. BNP elevated at 424, clinically mild lower extremity edema. -Giving 1 more dose of IV Lasix -Will monitor volume status closely  COPD exacerbation (HCC) RVP positive for parainfluenza virus.  Similar symptoms in girlfriend and nephew. -Steroid and Z-Pak was added yesterday.  Much improved at baseline of  oxygen requirement and no further significant wheeze. -Continue supportive care  Depression -Continue home bupropion  CAD S/P percutaneous coronary  angioplasty Mildly elevated troponin with a flat curve and downward trend.  No chest pain.  Initially started on heparin infusion in ED which was not continued.  Likely secondary to demand ischemia with current respiratory status. -Continue home aspirin, Plavix and atorvastatin. -Cardiology was consulted and they are recommending to restart home Bumex, Coreg and Jardiance.  GERD (gastroesophageal reflux disease) -Continue Protonix  Chronic pain syndrome -Continue home oxycodone as needed  Nicotine dependence Counseling was provided. -Nicotine patch   Pain control - [place] Controlled Substance Reporting System database was reviewed. and patient was instructed, not to drive, operate heavy machinery, perform activities at heights, swimming or participation in water activities or provide baby-sitting services while on Pain, Sleep and Anxiety Medications; until their outpatient Physician has advised to do so again. Also recommended to not to take more than prescribed Pain, Sleep and Anxiety Medications.  Consultants: Cardiology Procedures performed: None Disposition: Home Diet recommendation:  Discharge Diet Orders (From admission, onward)     Start     Ordered   01/04/23 0000  Diet - low sodium heart healthy        01/04/23 1044           Cardiac and Carb modified diet DISCHARGE MEDICATION: Allergies as of 01/04/2023       Reactions   Acetaminophen Nausea And Vomiting   Lidocaine Rash   Tylenol [acetaminophen] Hives        Medication List     STOP taking these medications    metoprolol succinate 25 MG 24 hr tablet Commonly known as: TOPROL-XL   sacubitril-valsartan 24-26 MG Commonly known as: ENTRESTO   spironolactone 25 MG tablet Commonly known as: ALDACTONE       TAKE these medications    aspirin EC 81 MG tablet Take 81 mg by mouth.   atorvastatin 80 MG tablet Commonly known as: LIPITOR Take 1 tablet (80 mg total) by mouth daily. What  changed: Another medication with the same name was removed. Continue taking this medication, and follow the directions you see here.   azithromycin 250 MG tablet Commonly known as: ZITHROMAX Take 1 tablet daily for next 4 days Start taking on: January 05, 2023   budesonide-formoterol 160-4.5 MCG/ACT inhaler Commonly known as: SYMBICORT Inhale 1 puff into the lungs 2 (two) times daily. What changed: Another medication with the same name was removed. Continue taking this medication, and follow the directions you see here.   bumetanide 2 MG tablet Commonly known as: BUMEX Take 2 mg by mouth 2 (two) times daily. 1 TABLET (2 MG TOTAL) BY MOUTH DAILY AND 0.5 TABLETS (1 MG TOTAL) DAILY WITH LUNCH   buPROPion 300 MG 24 hr tablet Commonly known as: WELLBUTRIN XL Take 300 mg by mouth daily.   carvedilol 3.125 MG tablet Commonly known as: COREG Take 3.125 mg by mouth 2 (two) times daily. What changed: Another medication with the same name was removed. Continue taking this medication, and follow the directions you see here.   clopidogrel 75 MG tablet Commonly known as: PLAVIX Take 75 mg by mouth every morning.   docusate sodium 100 MG capsule Commonly known as: COLACE Take 1 capsule (100 mg total) by mouth 2 (two) times daily.   empagliflozin 10 MG Tabs tablet Commonly known as: JARDIANCE Take 1 tablet by mouth daily.   folic acid 1 MG tablet Commonly known  as: FOLVITE Take 1 tablet (1 mg total) by mouth daily.   gabapentin 300 MG capsule Commonly known as: NEURONTIN Take 300 mg by mouth 3 (three) times daily. What changed: Another medication with the same name was removed. Continue taking this medication, and follow the directions you see here.   ipratropium-albuterol 0.5-2.5 (3) MG/3ML Soln Commonly known as: DUONEB Take 3 mLs by nebulization every 6 (six) hours as needed (Shortness of breath).   magnesium oxide 400 MG tablet Commonly known as: MAG-OX Take 1 tablet by mouth  daily.   nicotine 21 mg/24hr patch Commonly known as: NICODERM CQ - dosed in mg/24 hours 21 mg daily.   omeprazole 20 MG capsule Commonly known as: PRILOSEC Take 1 capsule (20 mg total) by mouth daily as needed (Acid reflux).   oxyCODONE 5 MG immediate release tablet Commonly known as: Oxy IR/ROXICODONE Take 2 tablets (10 mg total) by mouth every 8 (eight) hours as needed.   polyethylene glycol 17 g packet Commonly known as: MIRALAX / GLYCOLAX Take 17 g by mouth daily.   potassium chloride 10 MEQ tablet Commonly known as: KLOR-CON M Take 1 tablet (10 mEq total) by mouth daily.   predniSONE 50 MG tablet Commonly known as: DELTASONE 1 tablet daily starting from tomorrow for the next 5 days   Spiriva Respimat 2.5 MCG/ACT Aers Generic drug: Tiotropium Bromide Monohydrate Inhale 2 puffs into the lungs daily. What changed: Another medication with the same name was removed. Continue taking this medication, and follow the directions you see here.   tamsulosin 0.4 MG Caps capsule Commonly known as: FLOMAX Take 0.4 mg by mouth daily.   thiamine 100 MG tablet Commonly known as: VITAMIN B1 Take 1 tablet (100 mg total) by mouth daily. What changed: Another medication with the same name was removed. Continue taking this medication, and follow the directions you see here.   Ventolin HFA 108 (90 Base) MCG/ACT inhaler Generic drug: albuterol Inhale 2 puffs into the lungs every 6 (six) hours as needed. What changed: Another medication with the same name was removed. Continue taking this medication, and follow the directions you see here.        Follow-up Information     [facility]. Go in 5 day(s).   Specialty: Cardiology Why: Hospital Follow-Up 01/10/23 @ 1:00pm Please bring all medications [facility] [address] [employer] Contact information: [address] [place]  [address] [phone]        Autumn Messing, MD. Go in 1 week(s).   Specialties: Cardiology, Internal Medicine Contact information: [address] [facility] [place] [address] [phone]                Discharge Exam: Ceasar Mons Weights   01/02/23 0149 01/02/23 1617 01/03/23 0728  Weight: 130.8 kg 124.2 kg 123.1 kg   General.  Obese gentleman, in no acute distress. Pulmonary.  Lungs clear bilaterally, normal respiratory effort. CV.  Regular rate and rhythm, no JVD, rub or murmur. Abdomen.  Soft, nontender, nondistended, BS positive. CNS.  Alert and oriented .  No focal neurologic deficit. Extremities.  No edema, no cyanosis, pulses intact and symmetrical. Psychiatry.  Judgment and insight appears normal.   Condition at discharge: stable  The results of significant diagnostics from this hospitalization (including imaging, microbiology, ancillary and laboratory) are listed below for reference.   Imaging Studies: ECHOCARDIOGRAM COMPLETE  Result Date: 01/02/2023    ECHOCARDIOGRAM REPORT  Patient Name:   Tommy Gaines Date of Exam: 01/02/2023 Medical Rec #:  [id number]             Height:       73.0 in Accession #:    [id number]            Weight:       288.4 lb Date of Birth:  1965-09-22             BSA:          2.514 m Patient Age:    57 years              BP:           112/74 mmHg Patient Gender: M                     HR:           83 bpm. Exam Location:  [facility] Procedure: 2D Echo, Cardiac Doppler and Color Doppler Indications:     CHF I50.9  History:         Patient has prior history of Echocardiogram examinations, most                  recent 11/07/2020. COPD and Stroke; Risk Factors:Hypertension.  Sonographer:     Cristela Blue Referring Phys:  [id number] KATY L FOUST Diagnosing Phys: Julien Nordmann MD  Sonographer Comments: Technically challenging study due to limited acoustic windows and no apical window. Image acquisition challenging due to COPD. IMPRESSIONS  1.  Left ventricular ejection fraction, by estimation, is 25 to 30%. The left ventricle has severely decreased function. The left ventricle demonstrates global hypokinesis. The left ventricular internal cavity size was moderately dilated. Left ventricular diastolic parameters are indeterminate.  2. Right ventricular systolic function is normal. The right ventricular size is normal. Tricuspid regurgitation signal is inadequate for assessing PA pressure.  3. The mitral valve is normal in structure. Mild mitral valve regurgitation. No evidence of mitral stenosis.  4. The aortic valve is normal in structure. Aortic valve regurgitation is not visualized. Aortic valve sclerosis is present, with no evidence of aortic valve stenosis.  5. The inferior vena cava is normal in size with greater than 50% respiratory variability, suggesting right atrial pressure of 3 mmHg. FINDINGS  Left Ventricle: Left ventricular ejection fraction, by estimation, is 25 to 30%. The left ventricle has severely decreased function. The left ventricle demonstrates global hypokinesis. The left ventricular internal cavity size was moderately dilated. There is no left ventricular hypertrophy. Left ventricular diastolic parameters are indeterminate. Right Ventricle: The right ventricular size is normal. No increase in right ventricular wall thickness. Right ventricular systolic function is normal. Tricuspid regurgitation signal is inadequate for assessing PA pressure. Left Atrium: Left atrial size was normal in size. Right Atrium: Right atrial size was normal in size. Pericardium: There is no evidence of pericardial effusion. Mitral Valve: The mitral valve is normal in structure. Mild mitral valve regurgitation. No evidence of mitral valve stenosis. Tricuspid Valve: The tricuspid valve is normal in structure. Tricuspid valve regurgitation is not demonstrated. No evidence of tricuspid stenosis. Aortic Valve: The aortic valve is normal in structure. Aortic  valve regurgitation is not visualized. Aortic valve sclerosis is present, with no evidence of aortic valve stenosis. Pulmonic Valve: The pulmonic valve was normal in structure. Pulmonic valve regurgitation is not visualized. No evidence of pulmonic stenosis. Aorta: The aortic root is normal in size and structure. Venous: The inferior  vena cava is normal in size with greater than 50% respiratory variability, suggesting right atrial pressure of 3 mmHg. IAS/Shunts: No atrial level shunt detected by color flow Doppler.  LEFT VENTRICLE PLAX 2D LVIDd:         6.10 cm LVIDs:         5.60 cm LV PW:         1.10 cm LV IVS:        1.30 cm LVOT diam:     2.20 cm LVOT Area:     3.80 cm  LEFT ATRIUM         Index LA diam:    3.00 cm 1.19 cm/m   AORTA Ao Root diam: 3.60 cm  SHUNTS Systemic Diam: 2.20 cm Julien Nordmann MD Electronically signed by Julien Nordmann MD Signature Date/Time: 01/02/2023/5:16:42 PM    Final    DG Chest Portable 1 View  Result Date: 01/02/2023 CLINICAL DATA:  Shortness of breath. EXAM: PORTABLE CHEST 1 VIEW COMPARISON:  November 05, 2020 FINDINGS: The cardiac silhouette is mildly enlarged and unchanged in size. Mild prominence of the central pulmonary vasculature is seen without evidence of overt pulmonary edema. Mild stable atelectatic changes are seen within the mid left lung and bilateral lung bases. No pleural effusion or pneumothorax is identified. Bilateral shoulder replacements are seen. Multilevel degenerative changes are noted throughout the thoracic spine. IMPRESSION: 1. Mild cardiomegaly and central pulmonary vascular congestion without overt pulmonary edema. 2. Mild stable bilateral atelectasis. Electronically Signed   By: Aram Candela M.D.   On: 01/02/2023 02:16    Microbiology: Results for orders placed or performed during the hospital encounter of 01/02/23  MRSA Next Gen by PCR, Nasal     Status: None   Collection Time: 01/02/23 10:00 AM   Specimen: Nasal Mucosa; Nasal Swab   Result Value Ref Range Status   MRSA by PCR Next Gen NOT DETECTED NOT DETECTED Final    Comment: (NOTE) The GeneXpert MRSA Assay (FDA approved for NASAL specimens only), is one component of a comprehensive MRSA colonization surveillance program. It is not intended to diagnose MRSA infection nor to guide or monitor treatment for MRSA infections. Test performance is not FDA approved in patients less than 13 years old. Performed at [facility], [address] [place], [place] [address]   Respiratory (~20 pathogens) panel by PCR     Status: Abnormal   Collection Time: 01/02/23 10:41 AM   Specimen: Nasopharyngeal Swab; Respiratory  Result Value Ref Range Status   Adenovirus NOT DETECTED NOT DETECTED Final   Coronavirus 229E NOT DETECTED NOT DETECTED Final    Comment: (NOTE) The Coronavirus on the Respiratory Panel, DOES NOT test for the novel  Coronavirus (2019 nCoV)    Coronavirus HKU1 NOT DETECTED NOT DETECTED Final   Coronavirus NL63 NOT DETECTED NOT DETECTED Final   Coronavirus OC43 NOT DETECTED NOT DETECTED Final   Metapneumovirus NOT DETECTED NOT DETECTED Final   Rhinovirus / Enterovirus NOT DETECTED NOT DETECTED Final   Influenza A NOT DETECTED NOT DETECTED Final   Influenza B NOT DETECTED NOT DETECTED Final   Parainfluenza Virus 1 NOT DETECTED NOT DETECTED Final   Parainfluenza Virus 2 NOT DETECTED NOT DETECTED Final   Parainfluenza Virus 3 NOT DETECTED NOT DETECTED Final   Parainfluenza Virus 4 DETECTED (A) NOT DETECTED Final   Respiratory Syncytial Virus NOT DETECTED NOT DETECTED Final   Bordetella pertussis NOT DETECTED NOT DETECTED Final   Bordetella Parapertussis NOT DETECTED NOT DETECTED  Final   Chlamydophila pneumoniae NOT DETECTED NOT DETECTED Final   Mycoplasma pneumoniae NOT DETECTED NOT DETECTED Final    Comment: Performed at [facility], [address], [place], [place] [address]  SARS Coronavirus 2 by RT PCR (hospital order, performed in [facility] hospital lab) *cepheid single result test* Nasopharyngeal Swab     Status: None   Collection Time: 01/02/23 10:41 AM   Specimen: Nasopharyngeal Swab; Nasal Swab  Result Value Ref Range Status   SARS Coronavirus 2 by RT PCR NEGATIVE NEGATIVE Final    Comment: (NOTE) SARS-CoV-2 target nucleic acids are NOT DETECTED.  The SARS-CoV-2 RNA is generally detectable in upper and lower respiratory specimens during the acute phase of infection. The lowest concentration of SARS-CoV-2 viral copies this assay can detect is 250 copies / mL. A negative result does not preclude SARS-CoV-2 infection and should not be used as the sole basis for treatment or other patient management decisions.  A negative result may occur with improper specimen collection / handling, submission of specimen other than nasopharyngeal swab, presence of viral mutation(s) within the areas targeted by this assay, and inadequate number of viral copies (<250 copies / mL). A negative result must be combined with clinical observations, patient history, and epidemiological information.  Fact Sheet for Patients:   RoadLapTop.co.za  Fact Sheet for Healthcare Providers: http://kim-miller.com/  This test is not yet approved or  cleared by the [place] FDA and has been authorized for detection and/or diagnosis of SARS-CoV-2 by FDA under an Emergency Use Authorization (EUA).  This EUA will remain in effect (meaning this test can be used) for the duration of the COVID-19 declaration under Section 564(b)(1) of the Act, 21 U.S.C. section 360bbb-3(b)(1), unless the authorization is terminated or revoked sooner.  Performed at [facility], [address] [place], [place] [address]     Labs: CBC: Recent Labs  Lab 01/02/23 0153 01/03/23 0418  WBC 7.6 10.3  NEUTROABS 6.2  --   HGB 14.7 13.9  HCT 46.4 43.3  MCV 106.4* 103.3*  PLT 247 257   Basic Metabolic  Panel: Recent Labs  Lab 01/02/23 0153 01/03/23 0418  NA 136 137  K 4.2 4.7  CL 98 96*  CO2 30 35*  GLUCOSE 118* 107*  BUN 7 12  CREATININE 0.93 1.22  CALCIUM 9.0 8.6*  MG  --  2.3   Liver Function Tests: No results for input(s): "AST", "ALT", "ALKPHOS", "BILITOT", "PROT", "ALBUMIN" in the last 168 hours. CBG: No results for input(s): "GLUCAP" in the last 168 hours.  Discharge time spent: greater than 30 minutes.  This record has been created using Conservation officer, historic buildings. Errors have been sought and corrected,but may not always be located. Such creation errors do not reflect on the standard of care.   Signed: Arnetha Courser, MD [facility] 01/04/2023

## 2023-01-08 NOTE — Progress Notes (Deleted)
Advanced Heart Failure Clinic Note   Referring Physician: recent admission/ nurse navigator PCP: Pcp, No Cardiologist: Gwynne Edinger, MD @ [facility] (last seen 04/24/ returns 12/24)  HPI:  Tommy Gaines is a 57 y/o male with a history of HTN, CVA, hyperlipidemia, COPD, CKD, depression, obesity, PDA S/P repair at 57yo, CAD S/P percutaneous coronary angioplasty (2021), NSTEMI (2021), GERD (gastroesophageal reflux disease), BPH, anemia, chronic pain syndrome, tobacco use and chronic heart failure.   Admitted 03/05/22 due to acute Hypoxemic hypercarbic respiratory failure in the setting of COPD exacerbation and CAP. Course has been complicated by pre-renal AKI, hypotension and ileus. Creatinine peaked at 6 and improved with hydration and holding GDMT meds which were then resumed once renal function improved. Had to be intubated due to hypercarbia on 1/17 then successfully extubated on 1/18. Completed course of ceftriaxone (1/16-21), azithromycin (1/16-18), and prednisone. Weaned to 3-4L Rachel. Course complicated by significant constipation with nausea and vomiting prompting NG tube placement. NG removed on 01/22 following BMs and improvement in abdominal distension. Abdominal CT 1/26 demonstrating dilated loops of small bowel and colon most c/w ileus. Bowel function returned with miralax, senna and naloxegol. Hb slowly trickled down throughout admission, likely in setting of acute illness, CKD. Required 1u RBC on 1/31 for Hb <7. Iron studies notable for iron sat 11% and ferritin 168, which meets criteria for iron replacement in heart failure for symptomatic benefit. He was started on ferrlecit and completed 2g repletion. Renal ultrasound 03/14/22 was normal.   Admitted 01/02/23 due to shortness of breath with wheezing. With EMS he was hypoxic 88% on 3 L and received 2 DuoNebs and 125 mg IV Solu-Medrol and arrival. VBG pH 7.22, pCO2 85, and bicarb 34.8. Troponin 118--> 104, BNP 424.4. CXR obtained and shows mild  cardiomegaly, pulmonary vascular congestion, and stable bilateral atelectasis. Cardiology consulted. Had not been wearing bipap at home due to machine issues. Placed on bipap and given IV lasix. COVID, RSV and influenza negative, RVP with parainfluenza virus 4. Given antibiotics. IV lasix changed to oral bumex. Oxygen weaned to baseline. Elevated troponin thought to be due to demand ischemia.   Echo 01/30/20: EF 25-30% with Grade II DD, mild LVH, elevated left atrial pressure, normal PA pressure, moderate LAE, mild Tommy, no thrombus Echo 11/07/20: EF 35-40% with mild Tommy Echo 07/04/21: EF    Echo 03/07/22: EF 35% Echo 03/15/22: EF 35-40% Echo 01/02/23: EF 25-30% with mild Tommy  RHC/LHC 01/30/20: LM: Normal LAD: 100% mid LAD CTO after a large diagonal. Diagonal has 75% long eccentric lesion.          Left-to-left and right-to-left collaterals from large diagonals, ramus, LCx, and distal RCA to mid LAD. LAD itself appears to terminate short of apex  Ramus: Prox 50% stenosis LCx: Focal 75 % prox stenosis RCA: Prox 95%, mid 75% stenosis. (Culprit vessel) TIMI flow II distally  RA: 14 mmHg RV: 51/12 mmHg PA: 51/29 mmHg, mean PAP 39 mmHg PCW: 24-28 mmHg Qp/Qs: 1.0  CO: 11.2 L/min CI: CI 4.2 L/min/m2 PAPi 0.7  Elevated filling pressures without shock. Able to titrate norepinephrine off in the lab  Successful coronary intervention     PTCA and stent placement mid RCA Resolute Onyx 3.5X26 mm DES     Post-dilatation using 3.75X15 mm [place] balloon, dilated up to 22 atm   PCI 02/01/20: Successful, complex PTCA and Y-stenting to nondominant mid LAD which is severely diffusely diseased and large dominant D1 which essentially supplies the entire anterolateral wall and apex  with implantation of 2.0 x 38, 2.0 x 30 and a 2.0 x 15 mm overlapping stent into the mid LAD at D1 bifurcation and a 2.5 x 38 and a 2.5 x 8 mm resolute Onyx DES in the large dominant D1 mid segment up to D1/LAD bifurcation. Subtotally  occluded LAD reduced to 0% stenosis, TIMI flow improved from 1-3 and D1 stenosis reduced from 80% to 0% with TIMI-3 to TIMI-3 flow.   He presents today for his initial HF visit with a chief complaint of    Review of Systems: [y] = yes, [ ]  = no   General: Weight gain [ ] ; Weight loss [ ] ; Anorexia [ ] ; Fatigue [ ] ; Fever [ ] ; Chills [ ] ; Weakness [ ]   Cardiac: Chest pain/pressure [ ] ; Resting SOB [ ] ; Exertional SOB [ ] ; Orthopnea [ ] ; Pedal Edema [ ] ; Palpitations [ ] ; Syncope [ ] ; Presyncope [ ] ; Paroxysmal nocturnal dyspnea[ ]   Pulmonary: Cough [ ] ; Wheezing[ ] ; Hemoptysis[ ] ; Sputum [ ] ; Snoring [ ]   GI: Vomiting[ ] ; Dysphagia[ ] ; Melena[ ] ; Hematochezia [ ] ; Heartburn[ ] ; Abdominal pain [ ] ; Constipation [ ] ; Diarrhea [ ] ; BRBPR [ ]   GU: Hematuria[ ] ; Dysuria [ ] ; Nocturia[ ]   Vascular: Pain in legs with walking [ ] ; Pain in feet with lying flat [ ] ; Non-healing sores [ ] ; Stroke [ ] ; TIA [ ] ; Slurred speech [ ] ;  Neuro: Headaches[ ] ; Vertigo[ ] ; Seizures[ ] ; Paresthesias[ ] ;Blurred vision [ ] ; Diplopia [ ] ; Vision changes [ ]   Ortho/Skin: Arthritis [ ] ; Joint pain [ ] ; Muscle pain [ ] ; Joint swelling [ ] ; Back Pain [ ] ; Rash [ ]   Psych: Depression[ ] ; Anxiety[ ]   Heme: Bleeding problems [ ] ; Clotting disorders [ ] ; Anemia [ ]   Endocrine: Diabetes [ ] ; Thyroid dysfunction[ ]    Past Medical History:  Diagnosis Date   Arthritis    COPD (chronic obstructive pulmonary disease) (HCC)    Depression    Diverticulitis    Hyperlipidemia    Hypertension    Stroke (HCC) 2016   confirmed by CT scan     Current Outpatient Medications  Medication Sig Dispense Refill   aspirin EC 81 MG tablet Take 81 mg by mouth.     atorvastatin (LIPITOR) 80 MG tablet Take 1 tablet (80 mg total) by mouth daily. 30 tablet 3   azithromycin (ZITHROMAX) 250 MG tablet Take 1 tablet daily for next 4 days 6 each 0   budesonide-formoterol (SYMBICORT) 160-4.5 MCG/ACT inhaler Inhale 1 puff into the lungs 2 (two)  times daily.     bumetanide (BUMEX) 2 MG tablet Take 2 mg by mouth 2 (two) times daily. 1 TABLET (2 MG TOTAL) BY MOUTH DAILY AND 0.5 TABLETS (1 MG TOTAL) DAILY WITH LUNCH     buPROPion (WELLBUTRIN XL) 300 MG 24 hr tablet Take 300 mg by mouth daily.     carvedilol (COREG) 3.125 MG tablet Take 3.125 mg by mouth 2 (two) times daily.     clopidogrel (PLAVIX) 75 MG tablet Take 75 mg by mouth every morning.     docusate sodium (COLACE) 100 MG capsule Take 1 capsule (100 mg total) by mouth 2 (two) times daily. 10 capsule 0   empagliflozin (JARDIANCE) 10 MG TABS tablet Take 1 tablet by mouth daily.     folic acid (FOLVITE) 1 MG tablet Take 1 tablet (1 mg total) by mouth daily. 30 tablet 3   gabapentin (NEURONTIN) 300 MG capsule Take  300 mg by mouth 3 (three) times daily.     ipratropium-albuterol (DUONEB) 0.5-2.5 (3) MG/3ML SOLN Take 3 mLs by nebulization every 6 (six) hours as needed (Shortness of breath). 360 mL 0   magnesium oxide (MAG-OX) 400 MG tablet Take 1 tablet by mouth daily.     nicotine (NICODERM CQ - DOSED IN MG/24 HOURS) 21 mg/24hr patch 21 mg daily.     omeprazole (PRILOSEC) 20 MG capsule Take 1 capsule (20 mg total) by mouth daily as needed (Acid reflux). 30 capsule 0   oxyCODONE (OXY IR/ROXICODONE) 5 MG immediate release tablet Take 2 tablets (10 mg total) by mouth every 8 (eight) hours as needed. 20 tablet 0   polyethylene glycol (MIRALAX / GLYCOLAX) 17 g packet Take 17 g by mouth daily. 14 each 0   potassium chloride (KLOR-CON) 10 MEQ tablet Take 1 tablet (10 mEq total) by mouth daily. 30 tablet 3   predniSONE (DELTASONE) 50 MG tablet 1 tablet daily starting from tomorrow for the next 5 days 5 tablet 0   SPIRIVA RESPIMAT 2.5 MCG/ACT AERS Inhale 2 puffs into the lungs daily.     tamsulosin (FLOMAX) 0.4 MG CAPS capsule Take 0.4 mg by mouth daily.     thiamine 100 MG tablet Take 1 tablet (100 mg total) by mouth daily. (Patient not taking: Reported on 11/06/2020) 30 tablet 3   VENTOLIN HFA  108 (90 Base) MCG/ACT inhaler Inhale 2 puffs into the lungs every 6 (six) hours as needed.     No current facility-administered medications for this visit.    Allergies  Allergen Reactions   Acetaminophen Nausea And Vomiting   Lidocaine Rash   Tylenol [Acetaminophen] Hives      Social History   Socioeconomic History   Marital status: Single    Spouse name: Not on file   Number of children: 1   Years of education: Not on file   Highest education level: 11th grade  Occupational History   Occupation: Disability  Tobacco Use   Smoking status: Every Day    Current packs/day: 0.25    Average packs/day: 0.3 packs/day for 15.0 years (3.8 ttl pk-yrs)    Types: Cigarettes   Smokeless tobacco: Former   Tobacco comments:    He smokes 2-4 cigarettes a day  Vaping Use   Vaping status: Never Used  Substance and Sexual Activity   Alcohol use: Yes    Alcohol/week: 2.0 standard drinks of alcohol    Types: 1 Glasses of wine, 1 Shots of liquor per week    Comment: 2 -3 times a week   Drug use: Yes    Types: Marijuana   Sexual activity: Not on file  Other Topics Concern   Not on file  Social History Narrative   ** Merged History Encounter **       Social Determinants of Health   Financial Resource Strain: Low Risk  (01/02/2023)   Overall Financial Resource Strain (CARDIA)    Difficulty of Paying Living Expenses: Not hard at all  Food Insecurity: No Food Insecurity (01/02/2023)   Hunger Vital Sign    Worried About Running Out of Food in the Last Year: Never true    Ran Out of Food in the Last Year: Never true  Transportation Needs: No Transportation Needs (01/02/2023)   PRAPARE - Administrator, Civil Service (Medical): No    Lack of Transportation (Non-Medical): No  Physical Activity: Not on file  Stress: Not on file  Social  Connections: Not on file  Intimate Partner Violence: Not At Risk (01/02/2023)   Humiliation, Afraid, Rape, and Kick questionnaire    Fear  of Current or Ex-Partner: No    Emotionally Abused: No    Physically Abused: No    Sexually Abused: No      Family History  Problem Relation Age of Onset   Cancer Mother    Diabetes Mother    Heart disease Father        PHYSICAL EXAM: General:  Well appearing. No respiratory difficulty HEENT: normal Neck: supple. no JVD. Carotids 2+ bilat; no bruits. No lymphadenopathy or thyromegaly appreciated. Cor: PMI nondisplaced. Regular rate & rhythm. No rubs, gallops or murmurs. Lungs: clear Abdomen: soft, nontender, nondistended. No hepatosplenomegaly. No bruits or masses. Good bowel sounds. Extremities: no cyanosis, clubbing, rash, edema Neuro: alert & oriented x 3, cranial nerves grossly intact. moves all 4 extremities w/o difficulty. Affect pleasant.  ECG:   ASSESSMENT & PLAN:     Delma Freeze, FNP 01/08/23

## 2023-01-10 ENCOUNTER — Telehealth: Payer: Self-pay | Admitting: Family

## 2023-01-10 ENCOUNTER — Encounter: Payer: Medicaid Other | Admitting: Family

## 2023-01-10 NOTE — Telephone Encounter (Signed)
Patient did not show for his initial Heart Failure Clinic appointment on 01/10/23.

## 2024-01-30 ENCOUNTER — Emergency Department
Admission: EM | Admit: 2024-01-30 | Discharge: 2024-01-31 | Disposition: A | Attending: Emergency Medicine | Admitting: Emergency Medicine

## 2024-01-30 ENCOUNTER — Other Ambulatory Visit: Payer: Self-pay

## 2024-01-30 ENCOUNTER — Encounter: Payer: Self-pay | Admitting: Emergency Medicine

## 2024-01-30 DIAGNOSIS — S61210A Laceration without foreign body of right index finger without damage to nail, initial encounter: Secondary | ICD-10-CM | POA: Insufficient documentation

## 2024-01-30 DIAGNOSIS — Z9981 Dependence on supplemental oxygen: Secondary | ICD-10-CM | POA: Insufficient documentation

## 2024-01-30 DIAGNOSIS — Z8673 Personal history of transient ischemic attack (TIA), and cerebral infarction without residual deficits: Secondary | ICD-10-CM | POA: Insufficient documentation

## 2024-01-30 DIAGNOSIS — I10 Essential (primary) hypertension: Secondary | ICD-10-CM | POA: Insufficient documentation

## 2024-01-30 DIAGNOSIS — S61412A Laceration without foreign body of left hand, initial encounter: Secondary | ICD-10-CM | POA: Insufficient documentation

## 2024-01-30 DIAGNOSIS — J449 Chronic obstructive pulmonary disease, unspecified: Secondary | ICD-10-CM | POA: Insufficient documentation

## 2024-01-30 DIAGNOSIS — Z23 Encounter for immunization: Secondary | ICD-10-CM | POA: Insufficient documentation

## 2024-01-30 DIAGNOSIS — W540XXA Bitten by dog, initial encounter: Secondary | ICD-10-CM | POA: Insufficient documentation

## 2024-01-30 NOTE — ED Triage Notes (Signed)
 Pt with noted deep laceration to L posterior hand, bleeding controlled. Pt states he believes the dog is up to date on his shots since he is from the pound.   Pt states unknown last tetanus.

## 2024-01-30 NOTE — ED Triage Notes (Signed)
 First RN Note: Pt to ED via [facility] with c/o laceration to L hand and laceration to R index finger. Per EMS pt was trying to get a paper towel out of the dogs mouth, a dog he just brought home from the pound.   Per EMS pt on 3L O2 chronically.   110/76 74Hr 98% 3L

## 2024-01-31 ENCOUNTER — Emergency Department

## 2024-01-31 MED ORDER — AMOXICILLIN-POT CLAVULANATE 875-125 MG PO TABS: 1.0000 | ORAL_TABLET | Freq: Two times a day (BID) | ORAL | 0 refills | Status: AC

## 2024-01-31 MED ORDER — LIDOCAINE HCL (PF) 1 % IJ SOLN
5.0000 mL | Freq: Once | INTRAMUSCULAR | Status: AC
  Administered 2024-01-31: 5 mL
  Filled 2024-01-31: qty 5

## 2024-01-31 MED ORDER — TETANUS-DIPHTH-ACELL PERTUSSIS 5-2-15.5 LF-MCG/0.5 IM SUSP
0.5000 mL | Freq: Once | INTRAMUSCULAR | Status: AC
  Administered 2024-01-31: 0.5 mL via INTRAMUSCULAR
  Filled 2024-01-31: qty 0.5

## 2024-01-31 MED ORDER — AMOXICILLIN-POT CLAVULANATE 875-125 MG PO TABS
1.0000 | ORAL_TABLET | Freq: Once | ORAL | Status: AC
  Administered 2024-01-31: 1 via ORAL
  Filled 2024-01-31: qty 1

## 2024-01-31 NOTE — ED Provider Notes (Signed)
 [facility] Provider Note    Event Date/Time   First MD Initiated Contact with Patient 01/31/24 0000     (approximate)  History   Chief Complaint: Animal Bite  HPI  Laronn Devonshire is a 58 y.o. male with a past medical history of COPD on 3 L Gaines, Tommy Gaines, Tommy Gaines, Tommy Gaines, Tommy Gaines patient he adopted a dog from [employer] today.  He states Gaines dog got a hold of a paper towel he was trying to get it out of his mouth and Gaines dog bit his hand.  Patient states he was told that Gaines dog's vaccines are up-to-date.  He does not know when his last tetanus shot was.  Patient suffered lacerations to Gaines right index finger and left hand.  Physical Exam   Triage Vital Signs: ED Triage Vitals [01/30/24 2326]  Encounter Vitals Group     BP 105/66     Girls Systolic BP Percentile      Girls Diastolic BP Percentile      Boys Systolic BP Percentile      Boys Diastolic BP Percentile      Pulse Rate 73     Resp (!) 22     Temp 98.4 F (36.9 C)     Temp Source Oral     SpO2 97 %     Weight 230 lb (104.3 kg)     Height 6' 1.5 (1.867 m)     Head Circumference      Peak Flow      Pain Score 7     Pain Loc      Pain Education      Exclude from Growth Chart     Most recent vital signs: Vitals:   01/30/24 2326 01/30/24 2355  BP: 105/66   Pulse: 73   Resp: (!) 22   Temp: 98.4 F (36.9 C)   SpO2: 97% 100%    General: Awake, no distress.  CV:  Good peripheral perfusion.  Regular rate and rhythm  Resp:  Normal effort.  Equal breath sounds bilaterally.  Abd:  No distention.  Soft, nontender.   Other:  Patient has approximate 2 cm laceration to Gaines dorsal aspect of Gaines right index finger.  Patient has approximate 4 cm laceration to Gaines dorsal aspect of Gaines left hand that is widely gaping.  Mild bleeding from both.  Appears neuro vastly intact distally in both hands.   ED  Results / Procedures / Treatments   RADIOLOGY  I have reviewed Gaines x-ray images no obvious foreign body seen on my evaluation. Radiology confirms no radiopaque foreign body.   MEDICATIONS ORDERED IN ED: Medications  lidocaine  (PF) (XYLOCAINE ) 1 % injection 5 mL (has no administration in time range)  lidocaine  (PF) (XYLOCAINE ) 1 % injection 5 mL (has no administration in time range)  Tdap (ADACEL ) injection 0.5 mL (0.5 mLs Intramuscular Given 01/31/24 0100)     IMPRESSION / MDM / ASSESSMENT AND PLAN / ED COURSE  I reviewed Gaines triage vital signs and Gaines nursing notes.  Patient's presentation is most consistent with acute illness / injury with system symptoms.  Patient Tommy emergency department for a dog bite.  Patient just adopted a dog from [employer] [facility] today, states he was told that Gaines vaccines are up-to-date.  Patient does not know when his last tetanus shot was we will update today.  Patient has gaping wounds to  Gaines right index finger and dorsal aspect of Gaines left hand.  X-rays rule out foreign body.  I have numbed Gaines areas and extensively irrigated both wounds.  Applied 3 sutures to each wound for loose approximation given they were widely gaping and mildly bleeding.  We will cover Gaines patient with Augmentin  and have him follow-up with his doctor for suture removal in 7 days.  Discussed return precautions.  Patient agreeable to plan of care.  FINAL CLINICAL IMPRESSION(S) / ED DIAGNOSES   Dog bite  Rx / DC Orders   Augmentin   Note:  This document was prepared using Dragon voice recognition software and may include unintentional dictation errors.   Dorothyann Drivers, MD 01/31/24 [phone]

## 2024-01-31 NOTE — Discharge Instructions (Addendum)
 Keep the bandages in place for the next 24 hours.  After which you may wash with soap and warm water and then replace the bandage each day.  Please follow-up with your doctor in 7 days for suture removal.  Please take antibiotic as prescribed.  Return to the emergency department for any fever or any increased pain redness or swelling of the hands or any other symptom personally concerning to yourself.
